# Patient Record
Sex: Female | Born: 1970 | Race: White | Hispanic: No | Marital: Single | Smoking: Former smoker
Health system: Southern US, Community
[De-identification: ages and names within clinical notes are randomized; demographics above are authoritative.]

## PROBLEM LIST (undated history)

## (undated) DIAGNOSIS — G111 Early-onset cerebellar ataxia: Principal | ICD-10-CM

## (undated) DIAGNOSIS — S82899A Other fracture of unspecified lower leg, initial encounter for closed fracture: Secondary | ICD-10-CM

## (undated) DIAGNOSIS — K219 Gastro-esophageal reflux disease without esophagitis: Secondary | ICD-10-CM

## (undated) DIAGNOSIS — N200 Calculus of kidney: Secondary | ICD-10-CM

## (undated) DIAGNOSIS — J189 Pneumonia, unspecified organism: Secondary | ICD-10-CM

## (undated) DIAGNOSIS — Z9889 Other specified postprocedural states: Secondary | ICD-10-CM

## (undated) DIAGNOSIS — R011 Cardiac murmur, unspecified: Secondary | ICD-10-CM

## (undated) DIAGNOSIS — M1990 Unspecified osteoarthritis, unspecified site: Secondary | ICD-10-CM

## (undated) DIAGNOSIS — G1111 Friedreich ataxia: Secondary | ICD-10-CM

## (undated) DIAGNOSIS — J9600 Acute respiratory failure, unspecified whether with hypoxia or hypercapnia: Secondary | ICD-10-CM

## (undated) DIAGNOSIS — J449 Chronic obstructive pulmonary disease, unspecified: Secondary | ICD-10-CM

## (undated) DIAGNOSIS — Z9981 Dependence on supplemental oxygen: Secondary | ICD-10-CM

## (undated) DIAGNOSIS — J45909 Unspecified asthma, uncomplicated: Secondary | ICD-10-CM

## (undated) HISTORY — DX: Early-onset cerebellar ataxia: G11.1

## (undated) HISTORY — DX: Unspecified asthma, uncomplicated: J45.909

## (undated) HISTORY — PX: KNEE ARTHROSCOPY: SHX127

## (undated) HISTORY — DX: Acute respiratory failure, unspecified whether with hypoxia or hypercapnia: J96.00

## (undated) HISTORY — DX: Chronic obstructive pulmonary disease, unspecified: J44.9

## (undated) HISTORY — DX: Gastro-esophageal reflux disease without esophagitis: K21.9

## (undated) HISTORY — PX: CHOLECYSTECTOMY: SHX55

## (undated) HISTORY — DX: Friedreich ataxia: G11.11

## (undated) HISTORY — DX: Unspecified osteoarthritis, unspecified site: M19.90

## (undated) HISTORY — PX: TOE SURGERY: SHX1073

---

## 1998-07-20 ENCOUNTER — Other Ambulatory Visit: Admission: RE | Admit: 1998-07-20 | Discharge: 1998-07-20 | Payer: Self-pay | Admitting: Internal Medicine

## 1999-03-13 ENCOUNTER — Encounter: Payer: Self-pay | Admitting: Emergency Medicine

## 1999-03-13 ENCOUNTER — Inpatient Hospital Stay (HOSPITAL_COMMUNITY): Admission: EM | Admit: 1999-03-13 | Discharge: 1999-03-15 | Payer: Self-pay | Admitting: Emergency Medicine

## 1999-03-14 ENCOUNTER — Encounter: Payer: Self-pay | Admitting: Family Medicine

## 1999-03-29 ENCOUNTER — Encounter: Payer: Self-pay | Admitting: Emergency Medicine

## 1999-03-29 ENCOUNTER — Emergency Department (HOSPITAL_COMMUNITY): Admission: EM | Admit: 1999-03-29 | Discharge: 1999-03-29 | Payer: Self-pay | Admitting: Emergency Medicine

## 1999-06-10 ENCOUNTER — Emergency Department (HOSPITAL_COMMUNITY): Admission: EM | Admit: 1999-06-10 | Discharge: 1999-06-10 | Payer: Self-pay | Admitting: Emergency Medicine

## 1999-06-10 ENCOUNTER — Encounter: Payer: Self-pay | Admitting: Emergency Medicine

## 1999-08-16 ENCOUNTER — Encounter: Payer: Self-pay | Admitting: Emergency Medicine

## 1999-08-16 ENCOUNTER — Emergency Department (HOSPITAL_COMMUNITY): Admission: EM | Admit: 1999-08-16 | Discharge: 1999-08-16 | Payer: Self-pay | Admitting: Emergency Medicine

## 2000-07-28 ENCOUNTER — Emergency Department (HOSPITAL_COMMUNITY): Admission: EM | Admit: 2000-07-28 | Discharge: 2000-07-28 | Payer: Self-pay | Admitting: Emergency Medicine

## 2000-07-28 ENCOUNTER — Encounter: Payer: Self-pay | Admitting: Emergency Medicine

## 2001-08-26 ENCOUNTER — Emergency Department (HOSPITAL_COMMUNITY): Admission: EM | Admit: 2001-08-26 | Discharge: 2001-08-26 | Payer: Self-pay

## 2001-08-26 ENCOUNTER — Encounter: Payer: Self-pay | Admitting: Emergency Medicine

## 2002-01-25 ENCOUNTER — Encounter: Payer: Self-pay | Admitting: Emergency Medicine

## 2002-01-25 ENCOUNTER — Emergency Department (HOSPITAL_COMMUNITY): Admission: EM | Admit: 2002-01-25 | Discharge: 2002-01-25 | Payer: Self-pay | Admitting: Emergency Medicine

## 2002-03-14 ENCOUNTER — Emergency Department (HOSPITAL_COMMUNITY): Admission: EM | Admit: 2002-03-14 | Discharge: 2002-03-14 | Payer: Self-pay | Admitting: Emergency Medicine

## 2002-10-05 ENCOUNTER — Emergency Department (HOSPITAL_COMMUNITY): Admission: EM | Admit: 2002-10-05 | Discharge: 2002-10-05 | Payer: Self-pay | Admitting: Emergency Medicine

## 2002-10-05 ENCOUNTER — Encounter: Payer: Self-pay | Admitting: Emergency Medicine

## 2002-12-01 ENCOUNTER — Emergency Department (HOSPITAL_COMMUNITY): Admission: EM | Admit: 2002-12-01 | Discharge: 2002-12-01 | Payer: Self-pay | Admitting: Emergency Medicine

## 2002-12-01 ENCOUNTER — Encounter: Payer: Self-pay | Admitting: Emergency Medicine

## 2002-12-09 ENCOUNTER — Emergency Department (HOSPITAL_COMMUNITY): Admission: EM | Admit: 2002-12-09 | Discharge: 2002-12-10 | Payer: Self-pay | Admitting: Emergency Medicine

## 2002-12-29 ENCOUNTER — Encounter: Payer: Self-pay | Admitting: Emergency Medicine

## 2002-12-29 ENCOUNTER — Emergency Department (HOSPITAL_COMMUNITY): Admission: EM | Admit: 2002-12-29 | Discharge: 2002-12-29 | Payer: Self-pay | Admitting: Emergency Medicine

## 2003-01-08 ENCOUNTER — Encounter: Payer: Self-pay | Admitting: Orthopedic Surgery

## 2003-04-03 ENCOUNTER — Emergency Department (HOSPITAL_COMMUNITY): Admission: EM | Admit: 2003-04-03 | Discharge: 2003-04-03 | Payer: Self-pay | Admitting: Emergency Medicine

## 2003-04-29 ENCOUNTER — Emergency Department (HOSPITAL_COMMUNITY): Admission: EM | Admit: 2003-04-29 | Discharge: 2003-04-29 | Payer: Self-pay | Admitting: Emergency Medicine

## 2003-06-07 ENCOUNTER — Inpatient Hospital Stay (HOSPITAL_COMMUNITY): Admission: EM | Admit: 2003-06-07 | Discharge: 2003-06-10 | Payer: Self-pay | Admitting: Emergency Medicine

## 2003-06-13 ENCOUNTER — Encounter: Admission: RE | Admit: 2003-06-13 | Discharge: 2003-06-13 | Payer: Self-pay | Admitting: Family Medicine

## 2003-07-01 ENCOUNTER — Other Ambulatory Visit: Admission: RE | Admit: 2003-07-01 | Discharge: 2003-07-01 | Payer: Self-pay | Admitting: Family Medicine

## 2003-07-01 ENCOUNTER — Encounter: Admission: RE | Admit: 2003-07-01 | Discharge: 2003-07-01 | Payer: Self-pay | Admitting: Family Medicine

## 2003-07-01 ENCOUNTER — Encounter (INDEPENDENT_AMBULATORY_CARE_PROVIDER_SITE_OTHER): Payer: Self-pay | Admitting: *Deleted

## 2003-07-01 LAB — CONVERTED CEMR LAB

## 2003-07-31 ENCOUNTER — Encounter: Admission: RE | Admit: 2003-07-31 | Discharge: 2003-07-31 | Payer: Self-pay | Admitting: Family Medicine

## 2003-08-19 ENCOUNTER — Encounter: Admission: RE | Admit: 2003-08-19 | Discharge: 2003-08-19 | Payer: Self-pay | Admitting: Family Medicine

## 2003-09-01 ENCOUNTER — Encounter: Admission: RE | Admit: 2003-09-01 | Discharge: 2003-09-01 | Payer: Self-pay | Admitting: Sports Medicine

## 2003-10-12 ENCOUNTER — Emergency Department (HOSPITAL_COMMUNITY): Admission: EM | Admit: 2003-10-12 | Discharge: 2003-10-12 | Payer: Self-pay | Admitting: *Deleted

## 2003-12-11 ENCOUNTER — Encounter: Payer: Self-pay | Admitting: Orthopedic Surgery

## 2004-01-27 ENCOUNTER — Encounter: Admission: RE | Admit: 2004-01-27 | Discharge: 2004-01-30 | Payer: Self-pay | Admitting: Preventative Medicine

## 2004-03-10 ENCOUNTER — Emergency Department (HOSPITAL_COMMUNITY): Admission: EM | Admit: 2004-03-10 | Discharge: 2004-03-10 | Payer: Self-pay | Admitting: Emergency Medicine

## 2004-04-08 ENCOUNTER — Ambulatory Visit (HOSPITAL_COMMUNITY): Admission: RE | Admit: 2004-04-08 | Discharge: 2004-04-08 | Payer: Self-pay | Admitting: Orthopedic Surgery

## 2004-04-15 ENCOUNTER — Ambulatory Visit (HOSPITAL_COMMUNITY): Admission: RE | Admit: 2004-04-15 | Discharge: 2004-04-15 | Payer: Self-pay | Admitting: Orthopedic Surgery

## 2004-04-29 ENCOUNTER — Ambulatory Visit: Admission: RE | Admit: 2004-04-29 | Discharge: 2004-04-29 | Payer: Self-pay | Admitting: Orthopedic Surgery

## 2004-06-11 ENCOUNTER — Emergency Department (HOSPITAL_COMMUNITY): Admission: EM | Admit: 2004-06-11 | Discharge: 2004-06-11 | Payer: Self-pay | Admitting: Emergency Medicine

## 2005-03-27 ENCOUNTER — Emergency Department (HOSPITAL_COMMUNITY): Admission: EM | Admit: 2005-03-27 | Discharge: 2005-03-27 | Payer: Self-pay | Admitting: Emergency Medicine

## 2005-03-28 ENCOUNTER — Emergency Department (HOSPITAL_COMMUNITY): Admission: EM | Admit: 2005-03-28 | Discharge: 2005-03-28 | Payer: Self-pay | Admitting: Emergency Medicine

## 2005-05-05 ENCOUNTER — Emergency Department (HOSPITAL_COMMUNITY): Admission: EM | Admit: 2005-05-05 | Discharge: 2005-05-05 | Payer: Self-pay | Admitting: Emergency Medicine

## 2005-07-03 ENCOUNTER — Emergency Department (HOSPITAL_COMMUNITY): Admission: EM | Admit: 2005-07-03 | Discharge: 2005-07-03 | Payer: Self-pay | Admitting: Family Medicine

## 2006-01-05 ENCOUNTER — Emergency Department (HOSPITAL_COMMUNITY): Admission: EM | Admit: 2006-01-05 | Discharge: 2006-01-05 | Payer: Self-pay | Admitting: Emergency Medicine

## 2006-02-24 ENCOUNTER — Emergency Department (HOSPITAL_COMMUNITY): Admission: EM | Admit: 2006-02-24 | Discharge: 2006-02-24 | Payer: Self-pay | Admitting: Emergency Medicine

## 2006-05-25 ENCOUNTER — Encounter: Admission: RE | Admit: 2006-05-25 | Discharge: 2006-08-11 | Payer: Self-pay | Admitting: Orthopedic Surgery

## 2006-06-29 DIAGNOSIS — K279 Peptic ulcer, site unspecified, unspecified as acute or chronic, without hemorrhage or perforation: Secondary | ICD-10-CM | POA: Insufficient documentation

## 2006-06-29 DIAGNOSIS — J449 Chronic obstructive pulmonary disease, unspecified: Secondary | ICD-10-CM

## 2006-06-29 DIAGNOSIS — F17200 Nicotine dependence, unspecified, uncomplicated: Secondary | ICD-10-CM

## 2006-06-29 DIAGNOSIS — R8789 Other abnormal findings in specimens from female genital organs: Secondary | ICD-10-CM

## 2006-06-30 ENCOUNTER — Encounter (INDEPENDENT_AMBULATORY_CARE_PROVIDER_SITE_OTHER): Payer: Self-pay | Admitting: *Deleted

## 2006-10-24 ENCOUNTER — Emergency Department (HOSPITAL_COMMUNITY): Admission: EM | Admit: 2006-10-24 | Discharge: 2006-10-24 | Payer: Self-pay | Admitting: Emergency Medicine

## 2006-10-26 ENCOUNTER — Emergency Department (HOSPITAL_COMMUNITY): Admission: EM | Admit: 2006-10-26 | Discharge: 2006-10-26 | Payer: Self-pay | Admitting: Emergency Medicine

## 2007-01-13 ENCOUNTER — Emergency Department (HOSPITAL_COMMUNITY): Admission: EM | Admit: 2007-01-13 | Discharge: 2007-01-14 | Payer: Self-pay | Admitting: Emergency Medicine

## 2007-02-18 ENCOUNTER — Inpatient Hospital Stay (HOSPITAL_COMMUNITY): Admission: EM | Admit: 2007-02-18 | Discharge: 2007-02-19 | Payer: Self-pay | Admitting: Emergency Medicine

## 2007-06-12 ENCOUNTER — Inpatient Hospital Stay (HOSPITAL_COMMUNITY): Admission: EM | Admit: 2007-06-12 | Discharge: 2007-06-14 | Payer: Self-pay | Admitting: Emergency Medicine

## 2007-08-31 ENCOUNTER — Encounter: Payer: Self-pay | Admitting: Orthopedic Surgery

## 2007-08-31 ENCOUNTER — Emergency Department (HOSPITAL_COMMUNITY): Admission: EM | Admit: 2007-08-31 | Discharge: 2007-08-31 | Payer: Self-pay | Admitting: Emergency Medicine

## 2007-09-26 ENCOUNTER — Ambulatory Visit: Payer: Self-pay | Admitting: Orthopedic Surgery

## 2007-09-26 DIAGNOSIS — S62319A Displaced fracture of base of unspecified metacarpal bone, initial encounter for closed fracture: Secondary | ICD-10-CM

## 2007-09-27 ENCOUNTER — Encounter: Payer: Self-pay | Admitting: Orthopedic Surgery

## 2007-10-30 ENCOUNTER — Ambulatory Visit: Payer: Self-pay | Admitting: Orthopedic Surgery

## 2007-10-30 ENCOUNTER — Ambulatory Visit (HOSPITAL_COMMUNITY): Admission: RE | Admit: 2007-10-30 | Discharge: 2007-10-30 | Payer: Self-pay | Admitting: Orthopedic Surgery

## 2007-11-30 ENCOUNTER — Emergency Department (HOSPITAL_COMMUNITY): Admission: EM | Admit: 2007-11-30 | Discharge: 2007-12-01 | Payer: Self-pay | Admitting: Emergency Medicine

## 2008-01-09 ENCOUNTER — Emergency Department (HOSPITAL_COMMUNITY): Admission: EM | Admit: 2008-01-09 | Discharge: 2008-01-09 | Payer: Self-pay | Admitting: Emergency Medicine

## 2008-03-08 ENCOUNTER — Emergency Department (HOSPITAL_COMMUNITY): Admission: EM | Admit: 2008-03-08 | Discharge: 2008-03-08 | Payer: Self-pay | Admitting: Emergency Medicine

## 2008-10-31 ENCOUNTER — Emergency Department (HOSPITAL_COMMUNITY): Admission: EM | Admit: 2008-10-31 | Discharge: 2008-10-31 | Payer: Self-pay | Admitting: Emergency Medicine

## 2009-01-16 ENCOUNTER — Emergency Department (HOSPITAL_COMMUNITY): Admission: EM | Admit: 2009-01-16 | Discharge: 2009-01-16 | Payer: Self-pay | Admitting: Emergency Medicine

## 2009-03-05 ENCOUNTER — Inpatient Hospital Stay (HOSPITAL_COMMUNITY): Admission: EM | Admit: 2009-03-05 | Discharge: 2009-03-07 | Payer: Self-pay | Admitting: Emergency Medicine

## 2009-07-18 ENCOUNTER — Ambulatory Visit: Payer: Self-pay | Admitting: Pulmonary Disease

## 2009-07-19 ENCOUNTER — Inpatient Hospital Stay (HOSPITAL_COMMUNITY): Admission: EM | Admit: 2009-07-19 | Discharge: 2009-07-28 | Payer: Self-pay | Admitting: Emergency Medicine

## 2009-08-04 ENCOUNTER — Ambulatory Visit: Payer: Self-pay | Admitting: Pulmonary Disease

## 2009-08-19 ENCOUNTER — Ambulatory Visit: Payer: Self-pay | Admitting: Pulmonary Disease

## 2009-08-19 DIAGNOSIS — Z9981 Dependence on supplemental oxygen: Secondary | ICD-10-CM

## 2009-10-19 ENCOUNTER — Ambulatory Visit: Payer: Self-pay | Admitting: Pulmonary Disease

## 2009-10-30 ENCOUNTER — Ambulatory Visit (HOSPITAL_COMMUNITY): Admission: RE | Admit: 2009-10-30 | Discharge: 2009-10-30 | Payer: Self-pay | Admitting: Sports Medicine

## 2009-11-03 ENCOUNTER — Emergency Department (HOSPITAL_COMMUNITY): Admission: EM | Admit: 2009-11-03 | Discharge: 2009-11-03 | Payer: Self-pay | Admitting: Emergency Medicine

## 2009-11-10 ENCOUNTER — Encounter: Admission: RE | Admit: 2009-11-10 | Discharge: 2010-01-29 | Payer: Self-pay | Admitting: Sports Medicine

## 2009-12-03 ENCOUNTER — Ambulatory Visit: Payer: Self-pay | Admitting: Pulmonary Disease

## 2010-01-27 ENCOUNTER — Encounter: Payer: Self-pay | Admitting: Pulmonary Disease

## 2010-01-30 ENCOUNTER — Emergency Department (HOSPITAL_COMMUNITY): Admission: EM | Admit: 2010-01-30 | Discharge: 2010-01-30 | Payer: Self-pay | Admitting: Emergency Medicine

## 2010-02-01 ENCOUNTER — Ambulatory Visit: Payer: Self-pay | Admitting: Pulmonary Disease

## 2010-02-18 ENCOUNTER — Encounter: Admission: RE | Admit: 2010-02-18 | Discharge: 2010-02-18 | Payer: Self-pay | Admitting: Sports Medicine

## 2010-03-02 ENCOUNTER — Encounter: Admission: RE | Admit: 2010-03-02 | Discharge: 2010-03-03 | Payer: Self-pay | Admitting: Sports Medicine

## 2010-03-05 ENCOUNTER — Emergency Department (HOSPITAL_COMMUNITY): Admission: EM | Admit: 2010-03-05 | Discharge: 2010-03-06 | Payer: Self-pay | Admitting: Emergency Medicine

## 2010-03-18 ENCOUNTER — Encounter
Admission: RE | Admit: 2010-03-18 | Discharge: 2010-04-06 | Payer: Self-pay | Source: Home / Self Care | Admitting: Sports Medicine

## 2010-04-05 ENCOUNTER — Ambulatory Visit: Payer: Self-pay | Admitting: Pulmonary Disease

## 2010-04-05 DIAGNOSIS — G111 Early-onset cerebellar ataxia: Secondary | ICD-10-CM

## 2010-04-05 DIAGNOSIS — G1111 Friedreich ataxia: Secondary | ICD-10-CM | POA: Insufficient documentation

## 2010-05-22 ENCOUNTER — Encounter: Payer: Self-pay | Admitting: Orthopedic Surgery

## 2010-06-03 NOTE — Assessment & Plan Note (Signed)
Summary: f/u ///kp   Visit Type:  Follow-up Primary Evi Mccomb/Referring Leaira Fullam:  Claudie Revering  CC:  2 month follow up.  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  Was tx w/ IV abx, steroids, she was tx w/ Tamiflu as well.  H. Influ-beta lact neg. other cx were neg. Alpha 1-antitrypsin were >301. CXR showed NAD. She did have qutie a bit of agitation/anxiety during admission.  She was discharged on Avelox and prednisone taper. Discharged on O2 . Has home health assistance at present.  August 19, 2009 4:29 PM  Accompanied by partner - reviewed pFTs FEV1 0.84 (34%) She is feeling better but still very weak w/ some cough/congesiton. She has smoked since age 78. Father had severe COPD. She has smoked since discharge - now 2-3 cigs/d. Is on nicotine patches .  December 03, 2009 11:21 AM  In with mother & friend - cat scratches over her face, c/o green phlegm, cough , dyspnea, received cefdinir in june. Continues to smoke 4 cigs/ d - they smoke outside.   April 05, 2010 1:57 PM  Off advair , now on symbicort (red inhaler - confirmed) -, Breathing better,   she reports  quit in aug'11 but office staff saw her smoking Worse in am, O2 helps but does not use every night   Current Medications (verified): 1)  Ventolin Hfa 108 (90 Base) Mcg/act Aers (Albuterol Sulfate) .... Inhale 2 Puffs Every Four Hours As Needed 2)  Oxygen 2l/min .... At Bedtime 3)  Hydrocodone-Acetaminophen 5-500 Mg Tabs (Hydrocodone-Acetaminophen) .... As Needed For Pain 4)  Albuterol Sulfate (2.5 Mg/59ml) 0.083% Nebu (Albuterol Sulfate) .... As Needed  Allergies: No Known Drug Allergies  Past History:  Past Medical History: Last updated: 08/19/2009 1. Arthritis 2. diabetic 3. Acute hypercarbic respiratory failure w/ COPD 2/2 PNA-vent support.  . (07/19/09) 4.  COPD --Alpha 1-antitrypsin were >301.         --PFTs>>FEV1 34% (0.84)  Social History: Last updated: 02/01/2010 lives with partner,Has 2 children (ages 28 and 15) who lives with her brother.  Smoked 1/2 ppd started at age 55yrs.   No IV drug use. social alcohol use-- no caffeine  Past Pulmonary History:  Pulmonary History: Acute hypercarbic respiratory failure w/ COPD 2/2 PNA-vent support.  . (07/19/09)  COPD --Alpha 1-antitrypsin were >301.         --PFTs>>FEV1 34% (0.84)  Review of Systems       The patient complains of dyspnea on exertion.  The patient denies anorexia, fever, weight loss, weight gain, vision loss, decreased hearing, hoarseness, chest pain, syncope, peripheral edema, prolonged cough, headaches, hemoptysis, abdominal pain, melena, hematochezia, severe indigestion/heartburn, hematuria, muscle weakness, suspicious skin lesions, difficulty walking, depression, unusual weight change, abnormal bleeding, enlarged lymph nodes, and angioedema.    Vital Signs:  Patient profile:   40 year old female Height:      60 inches Weight:      83.8 pounds BMI:     16.43 O2 Sat:      94 % on Room air Temp:     98.4 degrees F oral Pulse rate:   73 / minute BP sitting:   122 / 90  (left arm) Cuff size:   small  Vitals Entered By: Zackery Barefoot CMA (April 05, 2010 1:46 PM)  O2 Flow:  Room air CC:  2 month follow up Comments Medications reviewed with patient Verified contact number and pharmacy with patient Zackery Barefoot [facility]  April 05, 2010 1:46 PM    Physical Exam  Additional Exam:  78 lbs December 03, 2009  GEN: A/Ox3;  , NAD, very thin dishelved.  HEENT:  [place]/AT, , EACs-clear, TMs-wnl, NOSE-clear, THROAT-clear, poor dental care NECK:  Supple w/ fair ROM; no JVD; normal carotid impulses w/o bruits; no thyromegaly or nodules palpated; no lymphadenopathy. 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 Patient Level III (04540)  Problem # 3:  TOBACCO DEPENDENCE (ICD-305.1)  I am not sure whether she willl not quit because she feels she will not live long anyway due to incurable neurological disease.  Orders: Est. Patient Level III (98119)  Medications Added to Medication List This Visit: 1)  Hydrocodone-acetaminophen 5-500 Mg Tabs (Hydrocodone-acetaminophen) .... As needed for pain 2)  Albuterol Sulfate (2.5 Mg/34ml) 0.083% Nebu (Albuterol sulfate) .... As needed  Patient Instructions: 1)  Copy sent to: Dr Claudie Revering 2)  Please schedule a follow-up appointment in 3- 4 months with TP 3)  check O2 satn & name of red inhaler

## 2010-06-03 NOTE — Miscellaneous (Signed)
Summary: Orders Update pft charges  Clinical Lists Changes  Orders: Added new Service order of Carbon Monoxide diffusing w/capacity (94720) - Signed Added new Service order of Lung Volumes (94240) - Signed Added new Service order of Spirometry (Pre & Post) (94060) - Signed 

## 2010-06-03 NOTE — Assessment & Plan Note (Signed)
Summary: pft and rov/klw   CC:  Pt is here for a f/u appt to discuss PFT results.  .  History of Present Illness: 40 yo female seen for initial pulmonary consult during hospitalization 07/19/09 for Acute Hypercarbic Resp Failure 2/2 PNA requiring vent support.  She was admitted   07/19/2009-  07/28/2009 for Acute hypercarbic respiratory failure and underlying chronic  obstructive pulmonary disease.and  Pneumonia. She was admitted w/ COPD exacerbation noted to have hypercarbic RF on admission required intubation.  Was tx w/ IV abx, steroids, she was tx w/ Tamiflu as well. Cx showed H. Influ-beta lact neg. other cx were neg. Alpha 1-antitrypsin were >301. CXR showed NAD. She did have qutie a bit of agitation/anxiety during admission. She did self extubated on 07/24/09. She was discharged on Avelox and prednisone taper. Discharged on O2 . Has home health assistance at present.  August 19, 2009 4:29 PM  Accompanied by partner - reviewed pFTs FEV1 0.84 (34%)  Denies chest pain,  orthopnea, hemoptysis, fever, n/v/d, edema, headache. She is feeling better but still very weak w/ some cough/congesiton .Denies chest pain,  orthopnea, hemoptysis, fever, n/v/d, edema, headache. She has smoked since age 77. Father had severe COPD. She has smoked since discharge - now 2-3 cigs/d. Is on nicotine patches presently.     Current Medications (verified): 1)  Ventolin Hfa 108 (90 Base) Mcg/act Aers (Albuterol Sulfate) .... Inhale 2 Puffs Every Four Hours As Needed 2)  Multivitamins   Tabs (Multiple Vitamin) .... Take 1 Tablet By Mouth Once A Day 3)  Advair Diskus 250-50 Mcg/dose Aepb (Fluticasone-Salmeterol) .... Inhale 1 Puff Two Times A Day  Allergies (verified): No Known Drug Allergies  Past History:  Social History: Last updated: 08/04/2009 lives with partner,Has 2 children (ages 50 and 85) who lives with her brother.  Smoked 1/2 ppd started at age 44yrs.   No IV drug use. social alcohol use no  caffeine  Past Medical History: 1. Arthritis 2. diabetic 3. Acute hypercarbic respiratory failure w/ COPD 2/2 PNA-vent support.  . (07/19/09) 4. COPD --Alpha 1-antitrypsin were >301.         --PFTs>>FEV1 34% (0.84)  Past Pulmonary History:  Pulmonary History: Acute hypercarbic respiratory failure w/ COPD 2/2 PNA-vent support.  . (07/19/09)  COPD --Alpha 1-antitrypsin were >301.         --PFTs>>  Review of Systems       The patient complains of dyspnea on exertion.  The patient denies anorexia, fever, weight loss, weight gain, vision loss, decreased hearing, hoarseness, chest pain, syncope, peripheral edema, prolonged cough, headaches, hemoptysis, abdominal pain, melena, hematochezia, severe indigestion/heartburn, hematuria, muscle weakness, transient blindness, difficulty walking, depression, unusual weight change, and abnormal bleeding.    Vital Signs:  Patient profile:   40 year old female Height:      60 inches Weight:      72 pounds BMI:     14.11 O2 Sat:      91 % on Room air Temp:     98.2 degrees F oral Pulse rate:   107 / minute BP sitting:   114 / 82  (left arm) Cuff size:   small  Vitals Entered By: Arman Filter LPN (August 19, 2009 4:11 PM)  O2 Flow:  Room air CC: Pt is here for a f/u appt to discuss PFT results.   Comments Medications reviewed with patient Arman Filter LPN  August 19, 2009 4:12 PM    Physical Exam  Additional Exam:  GEN:  A/Ox3; pleasant , NAD, very thin HEENT:  [place]/AT, , EACs-clear, TMs-wnl, NOSE-clear, THROAT-clear, poor dental care NECK:  Supple w/ fair ROM; no JVD; normal carotid impulses w/o bruits; no thyromegaly or nodules palpated; no lymphadenopathy. RESP  Decreased BS in bases  CARD:  RRR, no m/r/g   Musco: Warm bil,  no calf tenderness edema, clubbing, pulses intact, no edema    Impression & Recommendations:  Problem # 1:  COPD (ICD-496) Off prednisone ct advair - consider adding spiriva in future if she c/o exercise  intolerance  Problem # 2:  TOBACCO DEPENDENCE (ICD-305.1)  Best to focus our efforts here. Explained course of COPD, may be a candidate for chantix if she is able to commit to quit. NP visit in 1 month to FU on this  Orders: Est. 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 Patient Level III (87564)  Other Orders: Pneumococcal Vaccine (33295) Admin 1st Vaccine (18841)  Patient Instructions: 1)  Copy sent to: Dr Concepcion Elk 2)  Please schedule a follow-up appointment in 2 months with TP 3)  Pneumovax   Orders Added: 1)  Pneumococcal Vaccine [90732] 2)  Admin 1st Vaccine [90471] 3)  Est. Patient Level III [66063]    Immunizations Administered:  Pneumonia Vaccine:    Vaccine Type: Pneumovax    Site: right buttock    Mfr: Merck    Dose: 0.5 ml    Route: IM    Given by: Carver Fila    Exp. Date: 08/20/2010    Lot #: [id number]    VIS given: 11/28/95 version given August 19, 2009.

## 2010-06-03 NOTE — Assessment & Plan Note (Signed)
Summary: 2 months/ mbw   Visit Type:  Follow-up Primary Provider/Referring Provider:  Cammie Sickle  CC:  Pt here for follow up. Pt c/o waking up in the mornings "gaging" and can not breathe.  History of Present Illness: 40 yo female seen for initial pulmonary consult during hospitalization 07/19/09 for Acute Hypercarbic Resp Failure 2/2 PNA requiring vent support.  She was admitted   07/19/2009-  07/28/2009 w/ COPD exacerbation noted to have hypercarbic RF on admission required intubation.  Was tx w/ IV abx, steroids, she was tx w/ Tamiflu as well.  H. Influ-beta lact neg. other cx were neg. Alpha 1-antitrypsin were >301. CXR showed NAD. She did have qutie a bit of agitation/anxiety during admission.  She was discharged on Avelox and prednisone taper. Discharged on O2 . Has home health assistance at present.  August 19, 2009 4:29 PM  Accompanied by partner - reviewed pFTs FEV1 0.84 (34%) She is feeling better but still very weak w/ some cough/congesiton. She has smoked since age 86. Father had severe COPD. She has smoked since discharge - now 2-3 cigs/d. Is on nicotine patches .  December 03, 2009 11:21 AM  In with mother & friend - cat scratches over her face, c/o green phlegm, cough , dyspnea, received cefdinir in june. Continues to smoke 4 cigs/ d - they smoke outside. Denies chest pain,  orthopnea, hemoptysis, fever, n/v/d, edema, headache.     Preventive Screening-Counseling & Management  Alcohol-Tobacco     Smoking Status: current     Packs/Day: <0.25     Year Started: 1979  Caffeine-Diet-Exercise     Caffeine use/day: 0  Current Medications (verified): 1)  Ventolin Hfa 108 (90 Base) Mcg/act Aers (Albuterol Sulfate) .... Inhale 2 Puffs Every Four Hours As Needed 2)  Advair Diskus 250-50 Mcg/dose Aepb (Fluticasone-Salmeterol) .... Inhale 1 Puff Two Times A Day 3)  Oxygen 2l/min .... At Bedtime 4)  Albuterol Sulfate (2.5 Mg/5ml) 0.083% Nebu (Albuterol Sulfate) .... Inhale 1 Vial Via  Hhn Every 6 Hours 5)  Hydrocodone .... As Needed  Allergies (verified): No Known Drug Allergies  Past History:  Past Medical History: Last updated: 08/19/2009 1. Arthritis 2. diabetic 3. Acute hypercarbic respiratory failure w/ COPD 2/2 PNA-vent support.  . (07/19/09) 4. COPD --Alpha 1-antitrypsin were >301.         --PFTs>>FEV1 34% (0.84)  Social History: Last updated: 08/04/2009 lives with partner,Has 2 children (ages 55 and 97) who lives with her brother.  Smoked 1/2 ppd started at age 83yrs.   No IV drug use. social alcohol use no caffeine  Past Pulmonary History:  Pulmonary History: Acute hypercarbic respiratory failure w/ COPD 2/2 PNA-vent support.  . (07/19/09)  COPD --Alpha 1-antitrypsin were >301.         --PFTs>>FEV1 34% (0.84)  Social History: Smoking Status:  current Packs/Day:  <0.25  Review of Systems       The patient complains of dyspnea on exertion and prolonged cough.  The patient denies anorexia, fever, weight loss, weight gain, vision loss, decreased hearing, hoarseness, chest pain, syncope, peripheral edema, headaches, hemoptysis, abdominal pain, melena, hematochezia, severe indigestion/heartburn, hematuria, muscle weakness, difficulty walking, depression, unusual weight change, and abnormal bleeding.    Vital Signs:  Patient profile:   40 year old female Height:      60 inches Weight:      78 pounds BMI:     15.29 O2 Sat:      91 % on Room air Temp:  98.7 degrees F oral Pulse rate:   103 / minute BP sitting:   130 / 98  (left arm) Cuff size:   small  Vitals Entered By: Zackery Barefoot CMA (December 03, 2009 11:17 AM)  O2 Flow:  Room air CC: Pt here for follow up. Pt c/o waking up in the mornings "gaging" and can not breathe Comments Medications reviewed with patient Verified contact number and pharmacy with patient Zackery Barefoot CMA  December 03, 2009 11:17 AM    Physical Exam  Additional Exam:  78 lbs December 03, 2009  GEN: A/Ox3;  pleasant , NAD, very thin HEENT:  [place]/AT, , EACs-clear, TMs-wnl, NOSE-clear, THROAT-clear, poor dental care NECK:  Supple w/ fair ROM; no JVD; normal carotid impulses w/o bruits; no thyromegaly or nodules palpated; no lymphadenopathy. RESP  Coarse BS w/ few rhonchi CARD:  RRR, no m/r/g   Musco: Warm bil,  no calf tenderness edema, clubbing, pulses intact, no edema    Impression & Recommendations:  Problem # 1:  COPD (ICD-496) Will treat with azithro for acute bronchitis ct advair/ albuterol nebs  Problem # 2:  TOBACCO DEPENDENCE (ICD-305.1)  Clearly, paramount here I made it clear to her that we would stop seeing her unless she quit smoking. Also discussed dangers of smoking with oxygen around. Threatened her that we may have to take oxygen away if she does not quit in the next couple of months. This was expressed to her mohter & partner also.  Orders: Est. Patient Level IV (04540) Prescription Created Electronically [phone])  Medications Added to Medication List This Visit: 1)  Albuterol Sulfate (2.5 Mg/53ml) 0.083% Nebu (Albuterol sulfate) .... Inhale 1 vial via hhn every 6 hours 2)  Hydrocodone  .... As needed 3)  Azithromycin 500 Mg Tabs (Azithromycin) .... Once daily  Patient Instructions: 1)  Copy sent to:Dr Avubere 2)  Please schedule a follow-up appointment in 2 -3  months with TP 3)  Antibioic & albuterol Rx sent to pharmacy 4)  If you do not stop smoking , we will have to take oxygen away Prescriptions: AZITHROMYCIN 500 MG TABS (AZITHROMYCIN) once daily  #5 x 0   Entered and Authorized by:   Comer Locket Vassie Loll MD   Signed by:   Comer Locket Vassie Loll MD on 12/03/2009   Method used:   Electronically to        [id number] (retail)       [address]       [place], [place]  [address]       Ph: [phone]       Fax: [phone]   RxID:   [id number] ALBUTEROL SULFATE (2.5 MG/3ML) 0.083% NEBU (ALBUTEROL SULFATE) inhale 1 vial via HHN  every 6 hours  #90 x 3   Entered and Authorized by:   Comer Locket Vassie Loll MD   Signed by:   Comer Locket Vassie Loll MD on 12/03/2009   Method used:   [facility] to        [facility] [address] [id number] ([facility]       [address]       [place], [place]  [address]       Ph: [phone]       Fax: [phone]   RxID:   [id number]

## 2010-06-03 NOTE — Assessment & Plan Note (Signed)
Summary: NP follow up - COPD   Primary Provider/Referring Provider:  Cammie Sickle  CC:  follow up and states has been wearing o2 with acitivity.  History of Present Illness: 40 yo female seen for initial pulmonary consult during hospitalization 07/19/09 for Acute Hypercarbic Resp Failure 2/2 PNA requiring vent support.  She was admitted   07/19/2009-  07/28/2009 w/ COPD exacerbation noted to have hypercarbic RF on admission required intubation.  Was tx w/ IV abx, steroids, she was tx w/ Tamiflu as well.  H. Influ-beta lact neg. other cx were neg. Alpha 1-antitrypsin were >301. CXR showed NAD. She did have qutie a bit of agitation/anxiety during admission.  She was discharged on Avelox and prednisone taper. Discharged on O2 . Has home health assistance at present.  August 19, 2009 4:29 PM  Accompanied by partner - reviewed pFTs FEV1 0.84 (34%) She is feeling better but still very weak w/ some cough/congesiton. She has smoked since age 49. Father had severe COPD. She has smoked since discharge - now 2-3 cigs/d. Is on nicotine patches .  December 03, 2009 11:21 AM  In with mother & friend - cat scratches over her face, c/o green phlegm, cough , dyspnea, received cefdinir in june. Continues to smoke 4 cigs/ d - they smoke outside.   February 01, 2010 --Presents for follow up. Says she has not smoked since last ov.  She wears her o2 with acitivity and at bedtime. Her family is with her today.  Says her breathing is the same she has intermittent cough and congestion mostly clear. Some discolored mucus in am  but clears as day goes on.  Advised again about smoking cesstation and dangers of O2 and smoking. Family and pt verbalized understaniding. Entire family and pt smell strongly of smoke.    Medications Prior to Update: 1)  Ventolin Hfa 108 (90 Base) Mcg/act Aers (Albuterol Sulfate) .... Inhale 2 Puffs Every Four Hours As Needed 2)  Advair Diskus 250-50 Mcg/dose Aepb (Fluticasone-Salmeterol) .... Inhale 1  Puff Two Times A Day 3)  Oxygen 2l/min .... At Bedtime 4)  Hydrocodone .... As Needed  Current Medications (verified): 1)  Ventolin Hfa 108 (90 Base) Mcg/act Aers (Albuterol Sulfate) .... Inhale 2 Puffs Every Four Hours As Needed 2)  Advair Diskus 250-50 Mcg/dose Aepb (Fluticasone-Salmeterol) .... Inhale 1 Puff Two Times A Day 3)  Oxygen 2l/min .... At Bedtime 4)  Tylenol With Codeine #3 300-30 Mg Tabs (Acetaminophen-Codeine) .Marland Kitchen.. 1-2 Every 4-6 Hours As Needed  Allergies (verified): No Known Drug Allergies  Past History:  Past Medical History: Last updated: 08/19/2009 1. Arthritis 2. diabetic 3. Acute hypercarbic respiratory failure w/ COPD 2/2 PNA-vent support.  . (07/19/09) 4. COPD --Alpha 1-antitrypsin were >301.         --PFTs>>FEV1 34% (0.84)  Family History: Last updated: 09/26/2007 Diabetes 1st degree Family History of Diabetes Hx, family, asthma  Social History: Last updated: 02/01/2010 lives with partner,Has 2 children (ages 53 and 76) who lives with her brother.  Smoked 1/2 ppd started at age 45yrs.   No IV drug use. social alcohol use-- no caffeine  Risk Factors: Caffeine Use: 0 (12/03/2009)  Risk Factors: Smoking Status: current (12/03/2009) Packs/Day: <0.25 (12/03/2009)  Past Pulmonary History:  Pulmonary History: Acute hypercarbic respiratory failure w/ COPD 2/2 PNA-vent support.  . (07/19/09)  COPD --Alpha 1-antitrypsin were >301.         --PFTs>>FEV1 34% (0.84)  Social History: lives with partner,Has 2 children (ages 38 and 70)  who lives with her brother.  Smoked 1/2 ppd started at age 77yrs.   No IV drug use. social alcohol use-- no caffeine  Review of Systems      See HPI  Vital Signs:  Patient profile:   40 year old female Height:      60 inches O2 Sat:      91 % on 2 L/min cont Temp:     98.3 degrees F oral Pulse rate:   112 / minute BP sitting:   150 / 90  (left arm) Cuff size:   small  Vitals Entered By: Boone Master CNA/MA  (February 01, 2010 11:09 AM)  O2 Flow:  2 L/min cont CC: follow up and states has been wearing o2 with acitivity Is Patient Diabetic? No Comments Medications reviewed with patient Daytime contact number verified with patient. Boone Master CNA/MA  February 01, 2010 11:10 AM    Physical Exam  Additional Exam:  78 lbs December 03, 2009  GEN: A/Ox3;  , NAD, very thin dishelved.  HEENT:  [place]/AT, , EACs-clear, TMs-wnl, NOSE-clear, THROAT-clear, poor dental care NECK:  Supple w/ fair ROM; no JVD; normal carotid impulses w/o bruits; no thyromegaly or nodules palpated; no lymphadenopathy. RESP  Coarse BS w/ few rhonchi, no wheezing  CARD:  RRR, no m/r/g   Musco: Warm bil,  no calf tenderness edema, clubbing, pulses intact, no edema, leg brace on left  Skin intact     Impression & Recommendations:  Problem # 1:  COPD (ICD-496) Severe O2 dependent -compensated on present regimen.  advised on smoking cesstation -encouragement provided.  follow up Dr. Vassie Loll in 2 months   Medications Added to Medication List This Visit: 1)  Tylenol With Codeine #3 300-30 Mg Tabs (Acetaminophen-codeine) .Marland Kitchen.. 1-2 every 4-6 hours as needed  Complete Medication List: 1)  Ventolin Hfa 108 (90 Base) Mcg/act Aers (Albuterol sulfate) .... Inhale 2 puffs every four hours as needed 2)  Advair Diskus 250-50 Mcg/dose Aepb (Fluticasone-salmeterol) .... Inhale 1 puff two times a day 3)  Oxygen 2l/min  .... At bedtime 4)  Tylenol With Codeine #3 300-30 Mg Tabs (Acetaminophen-codeine) .Marland Kitchen.. 1-2 every 4-6 hours as needed  Other Orders: Est. Patient Level II (16109)  Patient Instructions: 1)  Flu shot today.  2)  Continue on Advair two times a day , brush/rinse and gargle after use.  3)  Please contact office for sooner follow up as needed  4)  follow up Dr. Vassie Loll in 2-3 months     Appended Document: flu vax documentation    Clinical Lists Changes  Orders: Added new Service order of Admin 1st Vaccine (60454) -  Signed Added new Service order of Flu Vaccine 72yrs + [phone]) - Signed Observations: Added new observation of FLU VAX VIS: 11/24/09 version (02/01/2010 14:18) Added new observation of FLU VAXLOT: [id number] (02/01/2010 14:18) Added new observation of FLU VAXMFR: [employer] (02/01/2010 14:18) Added new observation of FLU VAX EXP: 10/30/2010 (02/01/2010 14:18) Added new observation of FLU VAX DSE: 0.64ml (02/01/2010 14:18) Added new observation of FLU VAX: Fluvax 3+ (02/01/2010 14:18)Flu Vaccine Consent Questions     Do you have a history of severe allergic reactions to this vaccine? no    Any prior history of allergic reactions to egg and/or gelatin? no    Do you have a sensitivity to the preservative Thimersol? no    Do you have a past history of Guillan-Barre Syndrome? no    Do you currently have an acute  febrile illness? no    Have you ever had a severe reaction to latex? no    Vaccine information given and explained to patient? yes    Are you currently pregnant? no    Lot Number:[id number]   Exp Date:10/30/2010   Site Given  Left Deltoid IMw observation of FLU VAX DSE: 0.22ml (02/01/2010 14:18) Added new observation of FLU VAX: Fluvax 3+ (02/01/2010 14:18) Boone Master CNA/MA  February 01, 2010 2:19 PM     .lbflu

## 2010-06-03 NOTE — Assessment & Plan Note (Signed)
Summary: NP follow up - post hosp   CC:  post hosp follow up .  History of Present Illness: 40 yo female seen for initial pulmonary consult during hospitalization 07/19/09 for Acute Hypercarbic Resp Failure 2/2 PNA requiring vent support-CCM admission.   August 04, 2009-Presents for a post hosp follow up - states breathing is doing well but still having dyspnea. She was admitted   07/19/2009-  07/28/2009 for Acute hypercarbic respiratory failure and underlying chronic  obstructive pulmonary disease.and  Pneumonia. She was admitted w/ COPD exacerbation noted to have hypercarbic RF on admission required intubation.  Was tx w/ IV abx, steroids, she was tx w/ Tamiflu as well. Cx showed H. Influ-beta lact neg. other cx were neg. Alpha 1-antitrypsin were >301. CXR showed NAD. She did have qutie a bit of agitation/anxiety during admission. She did self extubated on 07/24/09. She was discharged on Avelox and prednisone taper. Discharged on O2 . Has home health assistance at present. Denies chest pain,  orthopnea, hemoptysis, fever, n/v/d, edema, headache. She is feeling better but still very weak w/ some cough/congesiton .Denies chest pain,  orthopnea, hemoptysis, fever, n/v/d, edema, headache. She has smoked since age 53. Father had severe COPD. She has smoked since discharge. Is on nicotine patches presently.   Preventive Screening-Counseling & Management  Alcohol-Tobacco     Smoking Status: quit  Medications Prior to Update: 1)  Metformin Hcl 500 Mg  Tabs (Metformin Hcl) 2)  Advair Diskus 250-50 Mcg/dose  Misc (Fluticasone-Salmeterol) 3)  Vicodin .Marland Kitchen.. 1 By Mouth Q 6 As Needed  Current Medications (verified): 1)  Metformin Hcl 500 Mg  Tabs (Metformin Hcl) 2)  Advair Diskus 250-50 Mcg/dose  Misc (Fluticasone-Salmeterol) 3)  Prednisone 10 Mg Tabs (Prednisone) .... Taper As Directed Per [facility] Discharge 4)  Avelox 400 Mg Tabs (Moxifloxacin Hcl) .... Take 1 Tablet By Mouth Once A Day 5)  Ventolin Hfa 108  (90 Base) Mcg/act Aers (Albuterol Sulfate) .... Inhale 2 Puffs Every Four Hours As Needed 6)  Multivitamins   Tabs (Multiple Vitamin) .... Take 1 Tablet By Mouth Once A Day 7)  Advair Diskus 250-50 Mcg/dose Aepb (Fluticasone-Salmeterol) .... Inhale 1 Puff Two Times A Day  Allergies (verified): No Known Drug Allergies  Past History:  Past Surgical History: Last updated: 06/29/2006 Arthroscopy, bith knees - 07/31/2003, Colposcopy (CIN 1) - 08/01/2003, Gallbladder surgery - 07/31/2003  Family History: Last updated: 09/26/2007 Diabetes 1st degree Family History of Diabetes Hx, family, asthma  Social History: Last updated: 08/04/2009 lives with partner,Has 2 children (ages 110 and 26) who lives with her brother.  Smoked 1/2 ppd started at age 70yrs.   No IV drug use. social alcohol use no caffeine  Risk Factors: Caffeine Use: 0 (09/26/2007)  Past Medical History: 1. Arthritis 2. diabetic 3. Acute hypercarbic respiratory failure w/ COPD 2/2 PNA-vent support.  . (07/19/09) 4. COPD --Alpha 1-antitrypsin were >301.         --PFTs>>  Past Pulmonary History:  Pulmonary History: Acute hypercarbic respiratory failure w/ COPD 2/2 PNA-vent support.  . (07/19/09)  COPD --Alpha 1-antitrypsin were >301.         --PFTs>>  Social History: lives with partner,Has 2 children (ages 34 and 73) who lives with her brother.  Smoked 1/2 ppd started at age 59yrs.   No IV drug use. social alcohol use no caffeineSmoking Status:  quit  Review of Systems      See HPI  Vital Signs:  Patient profile:   40 year old  female Height:      60 inches Weight:      79.50 pounds BMI:     15.58 O2 Sat:      93 % on Room air Temp:     99.4 degrees F oral Pulse rate:   104 / minute BP sitting:   106 / 72  (right arm) Cuff size:   small  Vitals Entered By: Boone Master CNA (August 04, 2009 12:13 PM)  O2 Flow:  Room air CC: post hosp follow up  Is Patient Diabetic? Yes Comments Medications reviewed with  patient Daytime contact number verified with patient. Boone Master CNA  August 04, 2009 12:13 PM    Physical Exam  Additional Exam:  GEN: A/Ox3; pleasant , NAD, very thin HEENT:  [place]/AT, , EACs-clear, TMs-wnl, NOSE-clear, THROAT-clear, poor dental care NECK:  Supple w/ fair ROM; no JVD; normal carotid impulses w/o bruits; no thyromegaly or nodules palpated; no lymphadenopathy. RESP  Decreased BS in bases  CARD:  RRR, no m/r/g   GI:   Soft & nt; nml bowel sounds; no organomegaly or masses detected. Musco: Warm bil,  no calf tenderness edema, clubbing, pulses intact, no edema Neuro: intact    Impression & Recommendations:  Problem # 1:  COPD (ICD-496) Recent flare w/ Hypercarbic RF 2/2 PNA requirng vent support. Now improved.  O2 sat at rest >90%, cont on O2 w/ actiivty and at bedtime finish prednisone and avelox.  follow up 2 weeks for PFTs to establish baseline lung fxn.  Alpha 1 was >301 .   Medications Added to Medication List This Visit: 1)  Prednisone 10 Mg Tabs (Prednisone) .... Taper as directed per hosp discharge 2)  Avelox 400 Mg Tabs (Moxifloxacin hcl) .... Take 1 tablet by mouth once a day 3)  Ventolin Hfa 108 (90 Base) Mcg/act Aers (Albuterol sulfate) .... Inhale 2 puffs every four hours as needed 4)  Multivitamins Tabs (Multiple vitamin) .... Take 1 tablet by mouth once a day 5)  Advair Diskus 250-50 Mcg/dose Aepb (Fluticasone-salmeterol) .... Inhale 1 puff two times a day  Complete Medication List: 1)  Metformin Hcl 500 Mg Tabs (Metformin hcl) 2)  Advair Diskus 250-50 Mcg/dose Misc (Fluticasone-salmeterol) 3)  Prednisone 10 Mg Tabs (Prednisone) .... Taper as directed per hosp discharge 4)  Avelox 400 Mg Tabs (Moxifloxacin hcl) .... Take 1 tablet by mouth once a day 5)  Ventolin Hfa 108 (90 Base) Mcg/act Aers (Albuterol sulfate) .... Inhale 2 puffs every four hours as needed 6)  Multivitamins Tabs (Multiple vitamin) .... Take 1 tablet by mouth once a day 7)  Advair  Diskus 250-50 Mcg/dose Aepb (Fluticasone-salmeterol) .... Inhale 1 puff two times a day  Other Orders: Est. Patient Level III (44010)  Patient Instructions: 1)  Finish prednisone as directed.  2)  Return in 2 weeks w/ PFTs Dr. Vassie Loll  3)  Wear O2 with acitivity and at bedtime , may take off at rest.  4)  Please contact office for sooner follow up if symptoms do not improve or worsen    Immunization History:  Influenza Immunization History:    Influenza:  historical (01/31/2008)   Appended Document: O2 portable request

## 2010-06-03 NOTE — Assessment & Plan Note (Signed)
Summary: NP follow up - COPD   CC:  follow up .  History of Present Illness: 40 yo female seen for initial pulmonary consult during hospitalization 07/19/09 for Acute Hypercarbic Resp Failure 2/2 PNA requiring vent support.  She was admitted   07/19/2009-  07/28/2009 for Acute hypercarbic respiratory failure and underlying chronic  obstructive pulmonary disease.and  Pneumonia. She was admitted w/ COPD exacerbation noted to have hypercarbic RF on admission required intubation.  Was tx w/ IV abx, steroids, she was tx w/ Tamiflu as well. Cx showed H. Influ-beta lact neg. other cx were neg. Alpha 1-antitrypsin were >301. CXR showed NAD. She did have qutie a bit of agitation/anxiety during admission. She did self extubated on 07/24/09. She was discharged on Avelox and prednisone taper. Discharged on O2 . Has home health assistance at present.  August 19, 2009 4:29 PM  Accompanied by partner - reviewed pFTs FEV1 0.84 (34%)  Denies chest pain,  orthopnea, hemoptysis, fever, n/v/d, edema, headache. She is feeling better but still very weak w/ some cough/congesiton .Denies chest pain,  orthopnea, hemoptysis, fever, n/v/d, edema, headache. She has smoked since age 8. Father had severe COPD. She has smoked since discharge - now 2-3 cigs/d. Is on nicotine patches presently.   October 19, 2009--Presents for follow up. Says she has been doing ok until last few days when she developed a cough and congestion. She continues to smoke unfortunately not ready to quit yet. Does complain of 3 days of cough, congestion , green mucus. No otc used. Denies chest pain,  orthopnea, hemoptysis, fever, n/v/d, edema, headache.     Medications Prior to Update: 1)  Ventolin Hfa 108 (90 Base) Mcg/act Aers (Albuterol Sulfate) .... Inhale 2 Puffs Every Four Hours As Needed 2)  Multivitamins   Tabs (Multiple Vitamin) .... Take 1 Tablet By Mouth Once A Day 3)  Advair Diskus 250-50 Mcg/dose Aepb (Fluticasone-Salmeterol) .... Inhale 1 Puff  Two Times A Day  Current Medications (verified): 1)  Ventolin Hfa 108 (90 Base) Mcg/act Aers (Albuterol Sulfate) .... Inhale 2 Puffs Every Four Hours As Needed 2)  Multivitamins   Tabs (Multiple Vitamin) .... Take 1 Tablet By Mouth Once A Day 3)  Advair Diskus 250-50 Mcg/dose Aepb (Fluticasone-Salmeterol) .... Inhale 1 Puff Two Times A Day 4)  Oxygen 2l/min .... At Bedtime 5)  Albuterol Sulfate (2.5 Mg/43ml) 0.083% Nebu (Albuterol Sulfate) .... Inhale 1 Vial Via Hhn Every 6 Hours  Allergies (verified): No Known Drug Allergies  Past History:  Past Medical History: Last updated: 08/19/2009 1. Arthritis 2. diabetic 3. Acute hypercarbic respiratory failure w/ COPD 2/2 PNA-vent support.  . (07/19/09) 4. COPD --Alpha 1-antitrypsin were >301.         --PFTs>>FEV1 34% (0.84)  Past Surgical History: Last updated: 06/29/2006 Arthroscopy, bith knees - 07/31/2003, Colposcopy (CIN 1) - 08/01/2003, Gallbladder surgery - 07/31/2003  Family History: Last updated: 09/26/2007 Diabetes 1st degree Family History of Diabetes Hx, family, asthma  Social History: Last updated: 08/04/2009 lives with partner,Has 2 children (ages 65 and 73) who lives with her brother.  Smoked 1/2 ppd started at age 54yrs.   No IV drug use. social alcohol use no caffeine  Risk Factors: Caffeine Use: 0 (09/26/2007)  Risk Factors: Smoking Status: quit (08/04/2009)  Past Pulmonary History:  Pulmonary History: Acute hypercarbic respiratory failure w/ COPD 2/2 PNA-vent support.  . (07/19/09)  COPD --Alpha 1-antitrypsin were >301.         --PFTs>>FEV1 34% (0.84)  Review of Systems  See HPI  Vital Signs:  Patient profile:   40 year old female Height:      60 inches Weight:      78 pounds BMI:     15.29 O2 Sat:      93 % on Room air Temp:     99.2 degrees F oral Pulse rate:   78 / minute BP sitting:   114 / 82  (left arm) Cuff size:   small  Vitals Entered By: Boone Master CNA/MA (October 19, 2009 9:56  AM)  O2 Flow:  Room air CC: follow up  Is Patient Diabetic? No Comments Medications reviewed with patient Daytime contact number verified with patient. Boone Master CNA/MA  October 19, 2009 10:03 AM    Physical Exam  Additional Exam:  GEN: A/Ox3; pleasant , NAD, very thin HEENT:  [place]/AT, , EACs-clear, TMs-wnl, NOSE-clear, THROAT-clear, poor dental care NECK:  Supple w/ fair ROM; no JVD; normal carotid impulses w/o bruits; no thyromegaly or nodules palpated; no lymphadenopathy. RESP  Coarse BS w/ few rhonchi CARD:  RRR, no m/r/g   Musco: Warm bil,  no calf tenderness edema, clubbing, pulses intact, no edema    Impression & Recommendations:  Problem # 1:  COPD (ICD-496) Mild flare REC:  Omnicef 300mg  two times a day for 7 days  Mucinex DM two times a day as needed cough/congesiton  Increase fluids.  Please continue to consider stopping smoking -this is the most important thing you need to do.  Please contact office for sooner follow up if symptoms do not improve or worsen  follow up Dr. Vassie Loll in 2-3 months   Medications Added to Medication List This Visit: 1)  Oxygen 2l/min  .... At bedtime 2)  Albuterol Sulfate (2.5 Mg/52ml) 0.083% Nebu (Albuterol sulfate) .... Inhale 1 vial via hhn every 6 hours 3)  Cefdinir 300 Mg Caps (Cefdinir) .Marland Kitchen.. 1 by mouth two times a day  Complete Medication List: 1)  Ventolin Hfa 108 (90 Base) Mcg/act Aers (Albuterol sulfate) .... Inhale 2 puffs every four hours as needed 2)  Multivitamins Tabs (Multiple vitamin) .... Take 1 tablet by mouth once a day 3)  Advair Diskus 250-50 Mcg/dose Aepb (Fluticasone-salmeterol) .... Inhale 1 puff two times a day 4)  Oxygen 2l/min  .... At bedtime 5)  Albuterol Sulfate (2.5 Mg/20ml) 0.083% Nebu (Albuterol sulfate) .... Inhale 1 vial via hhn every 6 hours 6)  Cefdinir 300 Mg Caps (Cefdinir) .Marland Kitchen.. 1 by mouth two times a day  Other Orders: Est. Patient Level III (40981)  Patient Instructions: 1)  Omnicef 300mg  two  times a day for 7 days  2)  Mucinex DM two times a day as needed cough/congesiton  3)  Increase fluids.  4)  Please continue to consider stopping smoking -this is the most important thing you need to do.  5)  Please contact office for sooner follow up if symptoms do not improve or worsen  6)  follow up Dr. Vassie Loll in 2-3 months  Prescriptions: VENTOLIN HFA 108 (90 BASE) MCG/ACT AERS (ALBUTEROL SULFATE) Inhale 2 puffs every four hours as needed  #1 x 2   Entered and Authorized by:   Rubye Oaks NP   Signed by:   Benancio Osmundson NP on 10/19/2009   Method used:   [facility]        [facility]  [address] [id number]       [address] [facility]       [place]  [place], [place]  [address]       Ph: [phone]       Fax: [phone]   RxID:   [id number] ADVAIR DISKUS 250-50 MCG/DOSE AEPB (FLUTICASONE-SALMETEROL) Inhale 1 puff two times a day  #1 x 5   Entered and Authorized by:   Rubye Oaks NP   Signed by:   Isatou Agredano NP on 10/19/2009   Method used:   [facility]        [facility]  [address] [id number]       [address]       [place], [place]  [address]       Ph: [phone]       Fax: [phone]   RxID:   [id number] CEFDINIR 300 MG CAPS (CEFDINIR) 1 by mouth two times a day  #14 x 0   Entered and Authorized by:   Rubye Oaks NP   Signed by:   Tennile Styles NP on 10/19/2009   Method used:   [facility]        [facility]  [address] [id number]       [address]       [place], [place]  [address]       Ph: [phone]       Fax: [phone]   RxID:   [id number]

## 2010-07-05 ENCOUNTER — Encounter: Payer: Self-pay | Admitting: Adult Health

## 2010-07-05 ENCOUNTER — Telehealth (INDEPENDENT_AMBULATORY_CARE_PROVIDER_SITE_OTHER): Payer: Self-pay | Admitting: *Deleted

## 2010-07-05 ENCOUNTER — Ambulatory Visit (INDEPENDENT_AMBULATORY_CARE_PROVIDER_SITE_OTHER): Payer: Self-pay | Admitting: Adult Health

## 2010-07-05 DIAGNOSIS — J449 Chronic obstructive pulmonary disease, unspecified: Secondary | ICD-10-CM

## 2010-07-05 DIAGNOSIS — Z9981 Dependence on supplemental oxygen: Secondary | ICD-10-CM

## 2010-07-13 NOTE — Assessment & Plan Note (Signed)
Summary: NP follow up - COPD   Primary Provider/Referring Provider:  Claudie Revering  CC:  3 month follow up.  requests refill on albuterol neb.  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  Was tx w/ IV abx, steroids, she was tx w/ Tamiflu as well.  H. Influ-beta lact neg. other cx were neg. Alpha 1-antitrypsin were >301. CXR showed NAD. She did have qutie a bit of agitation/anxiety during admission.  She was discharged on Avelox and prednisone taper. Discharged on O2 . Has home health assistance at present.  August 19, 2009 4:29 PM  Accompanied by partner - reviewed pFTs FEV1 0.84 (34%) She is feeling better but still very weak w/ some cough/congesiton. She has smoked since age 18. Father had severe COPD. She has smoked since discharge - now 2-3 cigs/d. Is on nicotine patches .  December 03, 2009 11:21 AM  In with mother & friend - cat scratches over her face, c/o green phlegm, cough , dyspnea, received cefdinir in june. Continues to smoke 4 cigs/ d - they smoke outside.   April 05, 2010 1:57 PM  Off advair , now on symbicort (red inhaler - confirmed) -, Breathing better,   she reports  quit in aug'11 but office staff saw her smoking Worse in am, O2 helps but does not use every night  July 05, 2010 --Returns for 3 month follow up. We discussed her meds it appears she is not taking her inhalers as recommended. She does have medicaid and says she can afford the $3 co pay for inhalers. She says she is not smoking however smells strong of smoke. Gets winded easily . NO increased cough or congestion.  Last cxr 11/11- copd changes. Denies chest pain,  orthopnea, hemoptysis, fever, n/v/d, edema, headache.    Preventive Screening-Counseling & Management  Alcohol-Tobacco     Smoking Status: quit     Year Quit: 01/2010  Medications Prior  to Update: 1)  Hydrocodone-Acetaminophen 5-500 Mg Tabs (Hydrocodone-Acetaminophen) .... As Needed For Pain 2)  Albuterol Sulfate (2.5 Mg/79ml) 0.083% Nebu (Albuterol Sulfate) .... As Needed 3)  Ventolin Hfa 108 (90 Base) Mcg/act Aers (Albuterol Sulfate) .... Inhale 2 Puffs Every Four Hours As Needed 4)  Oxygen 2l/min .... At Bedtime  Current Medications (verified): 1)  Ventolin Hfa 108 (90 Base) Mcg/act Aers (Albuterol Sulfate) .... Inhale 2 Puffs Every Four Hours As Needed 2)  Oxygen 2l/min .... At Bedtime 3)  Hydrocodone-Acetaminophen 5-500 Mg Tabs (Hydrocodone-Acetaminophen) .... As Needed For Pain 4)  Albuterol Sulfate (2.5 Mg/37ml) 0.083% Nebu (Albuterol Sulfate) .... As Needed  Allergies (verified): No Known Drug Allergies  Past History:  Past Medical History: Last updated: 08/19/2009 1. Arthritis 2. diabetic 3. Acute hypercarbic respiratory failure w/ COPD 2/2 PNA-vent support.  . (07/19/09) 4. COPD --Alpha 1-antitrypsin were >301.         --PFTs>>FEV1 34% (0.84)  Past Surgical History: Last updated: 06/29/2006 Arthroscopy, bith knees - 07/31/2003, Colposcopy (CIN 1) - 08/01/2003, Gallbladder surgery - 07/31/2003  Family History: Last updated: 09/26/2007 Diabetes 1st degree Family History of Diabetes Hx, family, asthma  Social History: Last updated: 07/05/2010 lives with partner,Has 2 children (ages 41 and 80) who lives with her brother.  quit smoking 01/2010 - Smoked 1/2 ppd started at age 39yrs.   No IV drug use. social alcohol use-- no  caffeine  Risk Factors: Smoking Status: quit (07/05/2010) Packs/Day: <0.25 (12/03/2009)  Past Pulmonary History:  Pulmonary History: Acute hypercarbic respiratory failure w/ COPD 2/2 PNA-vent support.  . (07/19/09)  COPD --Alpha 1-antitrypsin were >301.         --PFTs>>FEV1 34% (0.84)  Social History: lives with partner,Has 2 children (ages 37 and 56) who lives with her brother.  quit smoking 01/2010 - Smoked 1/2 ppd started at  age 45yrs.   No IV drug use. social alcohol use-- no caffeineSmoking Status:  quit  Review of Systems      See HPI  Vital Signs:  Patient profile:   40 year old female Height:      60 inches Weight:      81.50 pounds BMI:     15.97 O2 Sat:      91 % on Room air Temp:     96.7 degrees F oral Pulse rate:   92 / minute BP sitting:   126 / 74  (left arm) Cuff size:   small  Vitals Entered By: Boone Master CNA/MA (July 05, 2010 10:39 AM)  O2 Flow:  Room air CC: 3 month follow up.  requests refill on albuterol neb Is Patient Diabetic? No Comments Medications reviewed with patient Daytime contact number verified with patient. Boone Master CNA/MA  July 05, 2010 10:40 AM    Physical Exam  Additional Exam:  GEN: A/Ox3;  , NAD, very thin dishelved, smells of smoke  HEENT:  [place]/AT, , EACs-clear, TMs-wnl, NOSE-clear, THROAT-clear, poor dental care NECK:  Supple w/ fair ROM; no JVD; normal carotid impulses w/o bruits; no thyromegaly or nodules palpated; no lymphadenopathy. RESP  Coarse BS w/ few rhonchi, no wheezing  CARD:  RRR, no m/r/g   Musco: Warm bil,  no calf tenderness edema, clubbing, pulses intact, no edema, leg brace on left  Skin intact     Impression & Recommendations:  Problem # 1:  COPD (ICD-496) Advised on med compliance  plan  Take Symbicort 160/4.15mcg 2 puffs two times a day --brush/rinse/garlge after use.  Mucinex DM two times a day as needed cough/congestion  Wear O2 2 l/m at bedtime and as needed with activity  follow up Dr. Vassie Loll in 3 months  Please contact office for sooner follow up if symptoms do not improve or worsen   Orders: Est. Patient Level III ([address])  Medications Added to Medication List This Visit: 1)  Symbicort 160-4.5 Mcg/act Aero (Budesonide-formoterol fumarate) .... 2 puffs two times a day 2)  Albuterol Sulfate (2.5 Mg/11ml) 0.083% Nebu (Albuterol sulfate) .Marland Kitchen.. 1 vial via neb every 4-6 hr as needed for wheezing /shortness of  breath  Patient Instructions: 1)  Take Symbicort 160/4.53mcg 2 puffs two times a day --brush/rinse/garlge after use.  2)  Mucinex DM two times a day as needed cough/congestion  3)  Wear O2 2 l/m at bedtime and as needed with activity  4)  follow up Dr. Vassie Loll in 3 months  5)  Please contact office for sooner follow up if symptoms do not improve or worsen  Prescriptions: ALBUTEROL SULFATE (2.5 MG/3ML) 0.083% NEBU (ALBUTEROL SULFATE) 1 vial via NEB every 4-6 hr as needed for wheezing /shortness of breath  #90 x 5   Entered and Authorized by:   Rubye Oaks NP   Signed by:   Kailani Brass NP on 07/05/2010   Method used:   [facility]        [facility]  [address] [id number]  [address]       [place], [place]  [address]       Ph: [phone]       Fax: [phone]   RxID:   [id number] SYMBICORT 160-4.5 MCG/ACT AERO (BUDESONIDE-FORMOTEROL FUMARATE) 2 puffs two times a day  #1 x 5   Entered and Authorized by:   Rubye Oaks NP   Signed by:   Sherelle Castelli NP on 07/05/2010   Method used:   [facility]        [facility]  [address] [id number]       [address]       [place], [place]  [address]       Ph: [phone]       Fax: [phone]   RxID:   [id number]

## 2010-07-13 NOTE — Progress Notes (Signed)
Summary: did not get prescription for sprivia at todays appt.   Phone Note Call from Patient Call back at Home Phone [phone] P PH     Caller: Patient Call For: Tammy P Summary of Call: Patient phoned stated that she was seen earlier today and was suposed to get a prescription for Sprivia wanted to change her from Advir. But she did not get the prescription before she left. [facility] [address]. She can be reached at [phone] Initial call taken by: Vedia Coffer,  July 05, 2010 3:41 PM  Follow-up for Phone Call        Called and spoke with pt and she states that TP mentioned putting her on spiriva today since she has been out of advair. Pt states TP told her she was going to give her an rx for spiriva and she did not get it. Please advise Tammy if you are okay with sending spiriva to the pharmacy [facility]  July 05, 2010 5:33 PM   Additional Follow-up for Phone Call Additional follow up Details #1::        No her instructions say Symbicort not advair --she wanted symbicort -she is not taking her meds so  bottom line is she  needs to take her assigned rx before I add anything else.  just explain to take symbicort 2 puffs two times a day  and on return we will look at spiriva  thanx  Additional Follow-up by: Rubye Oaks NP,  July 05, 2010 5:47 PM    Additional Follow-up for Phone Call Additional follow up Details #2::    Spoke with pt and notified of the above recs per TP.  Pt verbalized understanding and denied any questions.  Follow-up by: Vernie Murders,  July 05, 2010 5:53 PM

## 2010-07-14 LAB — BASIC METABOLIC PANEL
CO2: 28 mEq/L (ref 19–32)
Calcium: 8.5 mg/dL (ref 8.4–10.5)
GFR calc Af Amer: >60 mL/min (ref >60)
Glucose, Bld: 88 mg/dL (ref 70–99)
Potassium: 3.7 mEq/L (ref 3.5–5.1)
Sodium: 142 mEq/L (ref 135–145)

## 2010-07-14 LAB — BLOOD GAS, ARTERIAL
Drawn by: 326301
FIO2: 0.28 %
TCO2: 27.3 mmol/L (ref 0–100)
pCO2 arterial: 59.9 mmHg (ref 35.0–45.0)
pH, Arterial: 7.338 — ABNORMAL LOW (ref 7.350–7.400)
pO2, Arterial: 134 mmHg — ABNORMAL HIGH (ref 80.0–100.0)

## 2010-07-14 LAB — DIFFERENTIAL
Basophils Absolute: 0 10*3/uL (ref 0.0–0.1)
Lymphocytes Relative: 37 % (ref 12–46)
Lymphs Abs: 1.9 10*3/uL (ref 0.7–4.0)
Monocytes Absolute: 0.7 10*3/uL (ref 0.1–1.0)
Neutro Abs: 2.6 10*3/uL (ref 1.7–7.7)

## 2010-07-14 LAB — CBC
HCT: 47.1 % — ABNORMAL HIGH (ref 36.0–46.0)
RBC: 4.5 MIL/uL (ref 3.87–5.11)
RDW: 14.4 % (ref 11.5–15.5)
WBC: 5.3 10*3/uL (ref 4.0–10.5)

## 2010-07-14 LAB — URINALYSIS, ROUTINE W REFLEX MICROSCOPIC
Ketones, ur: NEGATIVE mg/dL
Leukocytes, UA: NEGATIVE
Nitrite: NEGATIVE
Protein, ur: NEGATIVE mg/dL

## 2010-07-14 LAB — GLUCOSE, CAPILLARY: Glucose-Capillary: 104 mg/dL — ABNORMAL HIGH (ref 70–99)

## 2010-07-15 LAB — ETHANOL

## 2010-07-26 LAB — BLOOD GAS, ARTERIAL
Acid-Base Excess: 5.2 mmol/L — ABNORMAL HIGH (ref 0.0–2.0)
Acid-Base Excess: 6.7 mmol/L — ABNORMAL HIGH (ref 0.0–2.0)
Acid-Base Excess: 8.2 mmol/L — ABNORMAL HIGH (ref 0.0–2.0)
Bicarbonate: 32.8 mEq/L — ABNORMAL HIGH (ref 20.0–24.0)
Bicarbonate: 33.4 mEq/L — ABNORMAL HIGH (ref 20.0–24.0)
Bicarbonate: 33.8 mEq/L — ABNORMAL HIGH (ref 20.0–24.0)
Bicarbonate: 33.9 mEq/L — ABNORMAL HIGH (ref 20.0–24.0)
Bicarbonate: 34.5 mEq/L — ABNORMAL HIGH (ref 20.0–24.0)
Bicarbonate: 38.2 mEq/L — ABNORMAL HIGH (ref 20.0–24.0)
Drawn by: 257701
FIO2: 0.4 %
FIO2: 0.4 %
FIO2: 0.4 %
MECHVT: 350 mL
MECHVT: 350 mL
MECHVT: 350 mL
MECHVT: 450 mL
O2 Content: 4 L/min
O2 Saturation: 93.1 %
O2 Saturation: 94.3 %
O2 Saturation: 97.2 %
O2 Saturation: 97.3 %
O2 Saturation: 98 %
O2 Saturation: 98.4 %
PEEP: 5 cmH2O
PEEP: 5 cmH2O
PEEP: 5 cmH2O
PEEP: 5 cmH2O
PEEP: 5 cmH2O
Patient temperature: 98.6
Patient temperature: 98.6
Patient temperature: 98.6
RATE: 16 resp/min
RATE: 16 resp/min
TCO2: 29.2 mmol/L (ref 0–100)
TCO2: 29.2 mmol/L (ref 0–100)
TCO2: 30.6 mmol/L (ref 0–100)
TCO2: 33.2 mmol/L (ref 0–100)
pCO2 arterial: 53.4 mmHg — ABNORMAL HIGH (ref 35.0–45.0)
pCO2 arterial: 57.7 mmHg (ref 35.0–45.0)
pCO2 arterial: 63.2 mmHg (ref 35.0–45.0)
pCO2 arterial: 75.5 mmHg (ref 35.0–45.0)
pCO2 arterial: 77.4 mmHg (ref 35.0–45.0)
pH, Arterial: 7.281 — ABNORMAL LOW (ref 7.350–7.400)
pH, Arterial: 7.314 — ABNORMAL LOW (ref 7.350–7.400)
pH, Arterial: 7.316 — ABNORMAL LOW (ref 7.350–7.400)
pH, Arterial: 7.334 — ABNORMAL LOW (ref 7.350–7.400)
pH, Arterial: 7.337 — ABNORMAL LOW (ref 7.350–7.400)
pH, Arterial: 7.426 — ABNORMAL HIGH (ref 7.350–7.400)
pO2, Arterial: 104 mmHg — ABNORMAL HIGH (ref 80.0–100.0)
pO2, Arterial: 114 mmHg — ABNORMAL HIGH (ref 80.0–100.0)
pO2, Arterial: 69.1 mmHg — ABNORMAL LOW (ref 80.0–100.0)
pO2, Arterial: 85.4 mmHg (ref 80.0–100.0)
pO2, Arterial: 93.6 mmHg (ref 80.0–100.0)

## 2010-07-26 LAB — BASIC METABOLIC PANEL
BUN: 11 mg/dL (ref 6–23)
BUN: 11 mg/dL (ref 6–23)
BUN: 5 mg/dL — ABNORMAL LOW (ref 6–23)
CO2: 32 mEq/L (ref 19–32)
CO2: 32 mEq/L (ref 19–32)
CO2: 33 mEq/L — ABNORMAL HIGH (ref 19–32)
CO2: 33 mEq/L — ABNORMAL HIGH (ref 19–32)
CO2: 40 mEq/L — ABNORMAL HIGH (ref 19–32)
Calcium: 7.3 mg/dL — ABNORMAL LOW (ref 8.4–10.5)
Calcium: 8.3 mg/dL — ABNORMAL LOW (ref 8.4–10.5)
Calcium: 8.8 mg/dL (ref 8.4–10.5)
Chloride: 102 mEq/L (ref 96–112)
Chloride: 81 mEq/L — ABNORMAL LOW (ref 96–112)
Chloride: 95 mEq/L — ABNORMAL LOW (ref 96–112)
Creatinine, Ser: 0.4 mg/dL (ref 0.4–1.2)
Creatinine, Ser: 0.41 mg/dL (ref 0.4–1.2)
Creatinine, Ser: 0.54 mg/dL (ref 0.4–1.2)
Creatinine, Ser: <0.3 mg/dL — ABNORMAL LOW (ref 0.4–1.2)
Creatinine, Ser: <0.3 mg/dL — ABNORMAL LOW (ref 0.4–1.2)
GFR calc Af Amer: >60 mL/min (ref >60)
GFR calc Af Amer: >60 mL/min (ref >60)
GFR calc non Af Amer: >60 mL/min (ref >60)
GFR calc non Af Amer: >60 mL/min (ref >60)
Glucose, Bld: 100 mg/dL — ABNORMAL HIGH (ref 70–99)
Glucose, Bld: 179 mg/dL — ABNORMAL HIGH (ref 70–99)
Glucose, Bld: 94 mg/dL (ref 70–99)
Glucose, Bld: 97 mg/dL (ref 70–99)
Potassium: 2.9 mEq/L — ABNORMAL LOW (ref 3.5–5.1)
Potassium: 4 mEq/L (ref 3.5–5.1)
Potassium: 5.4 mEq/L — ABNORMAL HIGH (ref 3.5–5.1)
Sodium: 137 mEq/L (ref 135–145)
Sodium: 137 mEq/L (ref 135–145)
Sodium: 137 mEq/L (ref 135–145)
Sodium: 139 mEq/L (ref 135–145)
Sodium: 141 mEq/L (ref 135–145)

## 2010-07-26 LAB — CULTURE, BAL-QUANTITATIVE W GRAM STAIN

## 2010-07-26 LAB — HEPATIC FUNCTION PANEL
ALT: 137 U/L — ABNORMAL HIGH (ref 0–35)
AST: 49 U/L — ABNORMAL HIGH (ref 0–37)
Albumin: 2 g/dL — ABNORMAL LOW (ref 3.5–5.2)
Bilirubin, Direct: 0.2 mg/dL (ref 0.0–0.3)

## 2010-07-26 LAB — GLUCOSE, CAPILLARY
Glucose-Capillary: 100 mg/dL — ABNORMAL HIGH (ref 70–99)
Glucose-Capillary: 109 mg/dL — ABNORMAL HIGH (ref 70–99)
Glucose-Capillary: 111 mg/dL — ABNORMAL HIGH (ref 70–99)
Glucose-Capillary: 115 mg/dL — ABNORMAL HIGH (ref 70–99)
Glucose-Capillary: 117 mg/dL — ABNORMAL HIGH (ref 70–99)
Glucose-Capillary: 117 mg/dL — ABNORMAL HIGH (ref 70–99)
Glucose-Capillary: 118 mg/dL — ABNORMAL HIGH (ref 70–99)
Glucose-Capillary: 120 mg/dL — ABNORMAL HIGH (ref 70–99)
Glucose-Capillary: 121 mg/dL — ABNORMAL HIGH (ref 70–99)
Glucose-Capillary: 121 mg/dL — ABNORMAL HIGH (ref 70–99)
Glucose-Capillary: 128 mg/dL — ABNORMAL HIGH (ref 70–99)
Glucose-Capillary: 131 mg/dL — ABNORMAL HIGH (ref 70–99)
Glucose-Capillary: 134 mg/dL — ABNORMAL HIGH (ref 70–99)
Glucose-Capillary: 139 mg/dL — ABNORMAL HIGH (ref 70–99)
Glucose-Capillary: 144 mg/dL — ABNORMAL HIGH (ref 70–99)
Glucose-Capillary: 164 mg/dL — ABNORMAL HIGH (ref 70–99)
Glucose-Capillary: 164 mg/dL — ABNORMAL HIGH (ref 70–99)
Glucose-Capillary: 166 mg/dL — ABNORMAL HIGH (ref 70–99)
Glucose-Capillary: 168 mg/dL — ABNORMAL HIGH (ref 70–99)
Glucose-Capillary: 172 mg/dL — ABNORMAL HIGH (ref 70–99)
Glucose-Capillary: 213 mg/dL — ABNORMAL HIGH (ref 70–99)
Glucose-Capillary: 216 mg/dL — ABNORMAL HIGH (ref 70–99)
Glucose-Capillary: 245 mg/dL — ABNORMAL HIGH (ref 70–99)
Glucose-Capillary: 56 mg/dL — ABNORMAL LOW (ref 70–99)
Glucose-Capillary: 61 mg/dL — ABNORMAL LOW (ref 70–99)
Glucose-Capillary: 64 mg/dL — ABNORMAL LOW (ref 70–99)
Glucose-Capillary: 74 mg/dL (ref 70–99)
Glucose-Capillary: 77 mg/dL (ref 70–99)
Glucose-Capillary: 87 mg/dL (ref 70–99)
Glucose-Capillary: 94 mg/dL (ref 70–99)
Glucose-Capillary: 96 mg/dL (ref 70–99)

## 2010-07-26 LAB — DIFFERENTIAL
Eosinophils Absolute: 0 10*3/uL (ref 0.0–0.7)
Eosinophils Relative: 0 % (ref 0–5)
Monocytes Absolute: 1.8 10*3/uL — ABNORMAL HIGH (ref 0.1–1.0)
Neutrophils Relative %: 63 % (ref 43–77)

## 2010-07-26 LAB — LEGIONELLA ANTIGEN, URINE: Legionella Antigen, Urine: NEGATIVE

## 2010-07-26 LAB — CBC
HCT: 41.8 % (ref 36.0–46.0)
HCT: 42.4 % (ref 36.0–46.0)
Hemoglobin: 12.7 g/dL (ref 12.0–15.0)
Hemoglobin: 13.4 g/dL (ref 12.0–15.0)
Hemoglobin: 13.5 g/dL (ref 12.0–15.0)
Hemoglobin: 13.6 g/dL (ref 12.0–15.0)
Hemoglobin: 13.8 g/dL (ref 12.0–15.0)
Hemoglobin: 14.1 g/dL (ref 12.0–15.0)
MCHC: 32.7 g/dL (ref 30.0–36.0)
MCHC: 32.9 g/dL (ref 30.0–36.0)
MCHC: 33.1 g/dL (ref 30.0–36.0)
MCHC: 33.3 g/dL (ref 30.0–36.0)
MCHC: 34.6 g/dL (ref 30.0–36.0)
MCHC: 34.8 g/dL (ref 30.0–36.0)
MCV: 103.6 fL — ABNORMAL HIGH (ref 78.0–100.0)
MCV: 105 fL — ABNORMAL HIGH (ref 78.0–100.0)
MCV: 105.4 fL — ABNORMAL HIGH (ref 78.0–100.0)
Platelets: 116 10*3/uL — ABNORMAL LOW (ref 150–400)
RBC: 3.53 MIL/uL — ABNORMAL LOW (ref 3.87–5.11)
RBC: 3.73 MIL/uL — ABNORMAL LOW (ref 3.87–5.11)
RBC: 3.9 MIL/uL (ref 3.87–5.11)
RBC: 3.99 MIL/uL (ref 3.87–5.11)
RBC: 4.04 MIL/uL (ref 3.87–5.11)
RBC: 4.07 MIL/uL (ref 3.87–5.11)
RBC: 4.65 MIL/uL (ref 3.87–5.11)
RDW: 15 % (ref 11.5–15.5)
RDW: 15.8 % — ABNORMAL HIGH (ref 11.5–15.5)
RDW: 16.3 % — ABNORMAL HIGH (ref 11.5–15.5)
WBC: 6 10*3/uL (ref 4.0–10.5)
WBC: 6.7 10*3/uL (ref 4.0–10.5)

## 2010-07-26 LAB — COMPREHENSIVE METABOLIC PANEL
ALT: 79 U/L — ABNORMAL HIGH (ref 0–35)
ALT: 89 U/L — ABNORMAL HIGH (ref 0–35)
ALT: 90 U/L — ABNORMAL HIGH (ref 0–35)
AST: 145 U/L — ABNORMAL HIGH (ref 0–37)
Albumin: 2 g/dL — ABNORMAL LOW (ref 3.5–5.2)
Albumin: 2.4 g/dL — ABNORMAL LOW (ref 3.5–5.2)
Alkaline Phosphatase: 82 U/L (ref 39–117)
Alkaline Phosphatase: 84 U/L (ref 39–117)
BUN: 3 mg/dL — ABNORMAL LOW (ref 6–23)
CO2: 27 mEq/L (ref 19–32)
CO2: 33 mEq/L — ABNORMAL HIGH (ref 19–32)
CO2: 38 mEq/L — ABNORMAL HIGH (ref 19–32)
Calcium: 7.6 mg/dL — ABNORMAL LOW (ref 8.4–10.5)
Calcium: 8.2 mg/dL — ABNORMAL LOW (ref 8.4–10.5)
Chloride: 96 mEq/L (ref 96–112)
Creatinine, Ser: <0.3 mg/dL — ABNORMAL LOW (ref 0.4–1.2)
GFR calc Af Amer: >60 mL/min (ref >60)
GFR calc non Af Amer: >60 mL/min (ref >60)
Glucose, Bld: 51 mg/dL — ABNORMAL LOW (ref 70–99)
Glucose, Bld: UNDETERMINED mg/dL (ref 70–99)
Potassium: 4 mEq/L (ref 3.5–5.1)
Potassium: 4.4 mEq/L (ref 3.5–5.1)
Sodium: 133 mEq/L — ABNORMAL LOW (ref 135–145)
Sodium: 133 mEq/L — ABNORMAL LOW (ref 135–145)
Sodium: 133 mEq/L — ABNORMAL LOW (ref 135–145)
Total Bilirubin: 0.9 mg/dL (ref 0.3–1.2)
Total Protein: 5.4 g/dL — ABNORMAL LOW (ref 6.0–8.3)

## 2010-07-26 LAB — AFB CULTURE WITH SMEAR (NOT AT ARMC): Acid Fast Smear: NONE SEEN

## 2010-07-26 LAB — URINALYSIS, ROUTINE W REFLEX MICROSCOPIC
Glucose, UA: NEGATIVE mg/dL
Protein, ur: 100 mg/dL — AB

## 2010-07-26 LAB — GLUCOSE, RANDOM: Glucose, Bld: 145 mg/dL — ABNORMAL HIGH (ref 70–99)

## 2010-07-26 LAB — BRAIN NATRIURETIC PEPTIDE: Pro B Natriuretic peptide (BNP): 170 pg/mL — ABNORMAL HIGH (ref 0.0–100.0)

## 2010-07-26 LAB — MAGNESIUM
Magnesium: 1.9 mg/dL (ref 1.5–2.5)
Magnesium: 2 mg/dL (ref 1.5–2.5)
Magnesium: UNDETERMINED mg/dL (ref 1.5–2.5)

## 2010-07-26 LAB — TROPONIN I: Troponin I: 0.19 ng/mL — ABNORMAL HIGH (ref 0.00–0.06)

## 2010-07-26 LAB — CARDIAC PANEL(CRET KIN+CKTOT+MB+TROPI)
CK, MB: 0.7 ng/mL (ref 0.3–4.0)
Relative Index: INVALID (ref 0.0–2.5)
Troponin I: 0.05 ng/mL (ref 0.00–0.06)

## 2010-07-26 LAB — RAPID URINE DRUG SCREEN, HOSP PERFORMED
Cocaine: NOT DETECTED
Opiates: NOT DETECTED

## 2010-07-26 LAB — CK TOTAL AND CKMB (NOT AT ARMC)
CK, MB: 2.3 ng/mL (ref 0.3–4.0)
Relative Index: INVALID (ref 0.0–2.5)

## 2010-07-26 LAB — INFLUENZA A H1N1
Influenza A RNA: NOT DETECTED
Swine Influenza H1 Gene: NOT DETECTED

## 2010-07-26 LAB — CULTURE, BLOOD (ROUTINE X 2)
Culture: NO GROWTH
Culture: NO GROWTH

## 2010-07-26 LAB — PNEUMOCYSTIS JIROVECI SMEAR BY DFA: Pneumocystis jiroveci Ag: NOT DETECTED

## 2010-07-26 LAB — URINE MICROSCOPIC-ADD ON

## 2010-07-26 LAB — URINE CULTURE: Colony Count: NO GROWTH

## 2010-07-26 LAB — VIRUS CULTURE

## 2010-07-26 LAB — LACTIC ACID, PLASMA: Lactic Acid, Venous: 4.5 mmol/L — ABNORMAL HIGH (ref 0.5–2.2)

## 2010-07-26 LAB — D-DIMER, QUANTITATIVE: D-Dimer, Quant: 1.07 ug/mL-FEU — ABNORMAL HIGH (ref 0.00–0.48)

## 2010-07-26 LAB — FUNGUS CULTURE W SMEAR: Fungal Smear: NONE SEEN

## 2010-07-26 LAB — ALPHA-1-ANTITRYPSIN: A-1 Antitrypsin, Ser: 301 mg/dL — ABNORMAL HIGH (ref 83–200)

## 2010-07-26 LAB — STREP PNEUMONIAE URINARY ANTIGEN: Strep Pneumo Urinary Antigen: NEGATIVE

## 2010-07-26 LAB — PHOSPHORUS
Phosphorus: 2.8 mg/dL (ref 2.3–4.6)
Phosphorus: 4.2 mg/dL (ref 2.3–4.6)

## 2010-08-04 LAB — CBC
HCT: 42.9 % (ref 36.0–46.0)
Hemoglobin: 13.5 g/dL (ref 12.0–15.0)
Hemoglobin: 14.9 g/dL (ref 12.0–15.0)
MCHC: 34.3 g/dL (ref 30.0–36.0)
MCHC: 34.7 g/dL (ref 30.0–36.0)
MCV: 103.3 fL — ABNORMAL HIGH (ref 78.0–100.0)
Platelets: 182 10*3/uL (ref 150–400)
Platelets: 187 K/uL (ref 150–400)
RBC: 4.15 MIL/uL (ref 3.87–5.11)
RDW: 14.5 % (ref 11.5–15.5)
RDW: 14.9 % (ref 11.5–15.5)
WBC: 5.9 K/uL (ref 4.0–10.5)

## 2010-08-04 LAB — BASIC METABOLIC PANEL
CO2: 28 mEq/L (ref 19–32)
Calcium: 8.8 mg/dL (ref 8.4–10.5)
Chloride: 98 mEq/L (ref 96–112)
GFR calc Af Amer: >60 mL/min (ref >60)
Glucose, Bld: 103 mg/dL — ABNORMAL HIGH (ref 70–99)
Sodium: 139 mEq/L (ref 135–145)

## 2010-08-04 LAB — DIFFERENTIAL
Basophils Absolute: 0 10*3/uL (ref 0.0–0.1)
Basophils Relative: 1 % (ref 0–1)
Eosinophils Absolute: 0 10*3/uL (ref 0.0–0.7)
Eosinophils Relative: 1 % (ref 0–5)
Lymphocytes Relative: 41 % (ref 12–46)
Lymphs Abs: 2.4 K/uL (ref 0.7–4.0)
Monocytes Absolute: 0.6 10*3/uL (ref 0.1–1.0)
Monocytes Relative: 10 % (ref 3–12)
Neutro Abs: 2.8 K/uL (ref 1.7–7.7)
Neutrophils Relative %: 49 % (ref 43–77)

## 2010-08-04 LAB — TSH: TSH: 0.524 u[IU]/mL (ref 0.350–4.500)

## 2010-08-04 LAB — GLUCOSE, CAPILLARY
Glucose-Capillary: 128 mg/dL — ABNORMAL HIGH (ref 70–99)
Glucose-Capillary: 134 mg/dL — ABNORMAL HIGH (ref 70–99)
Glucose-Capillary: 142 mg/dL — ABNORMAL HIGH (ref 70–99)
Glucose-Capillary: 143 mg/dL — ABNORMAL HIGH (ref 70–99)
Glucose-Capillary: 145 mg/dL — ABNORMAL HIGH (ref 70–99)
Glucose-Capillary: 162 mg/dL — ABNORMAL HIGH (ref 70–99)
Glucose-Capillary: 171 mg/dL — ABNORMAL HIGH (ref 70–99)
Glucose-Capillary: 221 mg/dL — ABNORMAL HIGH (ref 70–99)

## 2010-08-04 LAB — HEMOGLOBIN A1C
Hgb A1c MFr Bld: 5.1 % (ref 4.6–6.1)
Mean Plasma Glucose: 100 mg/dL

## 2010-08-04 LAB — COMPREHENSIVE METABOLIC PANEL
ALT: 26 U/L (ref 0–35)
Albumin: 3.1 g/dL — ABNORMAL LOW (ref 3.5–5.2)
Calcium: 9.2 mg/dL (ref 8.4–10.5)
Glucose, Bld: 178 mg/dL — ABNORMAL HIGH (ref 70–99)
Sodium: 137 mEq/L (ref 135–145)
Total Protein: 6.4 g/dL (ref 6.0–8.3)

## 2010-08-04 LAB — BASIC METABOLIC PANEL WITH GFR
BUN: 1 mg/dL — ABNORMAL LOW (ref 6–23)
Creatinine, Ser: 0.34 mg/dL — ABNORMAL LOW (ref 0.4–1.2)
GFR calc non Af Amer: >60 mL/min (ref >60)
Potassium: 3.1 meq/L — ABNORMAL LOW (ref 3.5–5.1)

## 2010-08-04 LAB — MAGNESIUM
Magnesium: 1.7 mg/dL (ref 1.5–2.5)
Magnesium: 2 mg/dL (ref 1.5–2.5)

## 2010-08-04 LAB — ETHANOL: Alcohol, Ethyl (B): 111 mg/dL — ABNORMAL HIGH (ref 0–10)

## 2010-08-06 LAB — BASIC METABOLIC PANEL
CO2: 26 mEq/L (ref 19–32)
Chloride: 99 mEq/L (ref 96–112)
GFR calc Af Amer: >60 mL/min (ref >60)
Potassium: 3.1 mEq/L — ABNORMAL LOW (ref 3.5–5.1)
Sodium: 138 mEq/L (ref 135–145)

## 2010-08-06 LAB — DIFFERENTIAL
Basophils Relative: 1 % (ref 0–1)
Eosinophils Absolute: 0.1 10*3/uL (ref 0.0–0.7)
Lymphocytes Relative: 56 % — ABNORMAL HIGH (ref 12–46)
Monocytes Absolute: 0.6 10*3/uL (ref 0.1–1.0)
Neutro Abs: 0.9 10*3/uL — ABNORMAL LOW (ref 1.7–7.7)

## 2010-08-06 LAB — RAPID URINE DRUG SCREEN, HOSP PERFORMED
Barbiturates: NOT DETECTED
Opiates: NOT DETECTED

## 2010-08-06 LAB — CBC
Hemoglobin: 15 g/dL (ref 12.0–15.0)
MCHC: 35.1 g/dL (ref 30.0–36.0)
MCV: 101.7 fL — ABNORMAL HIGH (ref 78.0–100.0)
RBC: 4.21 MIL/uL (ref 3.87–5.11)

## 2010-09-14 NOTE — H&P (Signed)
[name], [name]            ACCOUNT NO.:  [id number]   MEDICAL RECORD NO.:  [id number]          PATIENT TYPE:  INP   LOCATION:  1526                         FACILITY:  [facility]   PHYSICIAN:  Altha Harm, MDDATE OF BIRTH:  1970/09/16   DATE OF ADMISSION:  02/17/2007  DATE OF DISCHARGE:                              HISTORY & PHYSICAL   CHIEF COMPLAINT:  Shortness of breath.   HISTORY OF PRESENT ILLNESS:  This is a 40 year old smoker who presents  to the emergency room with complaints of shortness of breath getting  progressively worse over five days.  The patient describes dyspnea on  exertion over the past 2-3 days.  The patient denies any fever or  chills.  She does have a cough that was initially nonproductive,  however, she states that today it has been productive of greenish  sputum.  The patient also complains of emesis x5 days, however, she is  unable to expand upon the timing of the emesis and the frequency.  She  denies any chest pain.  She denies any palpitations.  She denies any  fever or chills.  The patient smokes about 1/2 pack per day and  continues to smoke presently.   PAST MEDICAL HISTORY:  Significant for diabetes type 2.   FAMILY HISTORY:  Significant for seizures.   SOCIAL HISTORY:  The patient smokes half pack per day for 22 years.  She  uses alcohol occasionally.  She denies any drug use.  The patient is  disable, cause unknown.   MEDICATIONS:  The patient cannot remember the name of her medications  and states that she takes pills for her diabetes.   ALLERGIES:  No known drug allergies.   PRIMARY CARE PHYSICIAN:  Windle Guard, M.D.   REVIEW OF SYSTEMS:  12 systems reviewed and all systems negative except  as noted in the HPI.   STUDIES:  In the emergency room, the patient had a chest x-ray which  shows mild peribronchial thickening.  Hemogram showed a white blood cell  count of 6.5, hemoglobin 15.5, hematocrit 43.9, and platelet count  125.  Sodium 140, potassium 3.4, chloride 97, bicarb 31, BUN less than 1,  creatinine 0.46.  ABG was ordered, however, the patient would not allow  the ABG to be performed.   PHYSICAL EXAMINATION:  GENERAL:  The patient was lying in bed in no  acute distress.  She appears nontoxic.  VITAL SIGNS:  Temperature 99.3, blood pressure 98/52, heart rate 101,  respiratory rate 18, O2 saturations are 91% on 2 liters.  Please note  the patient's O2 saturations dropped to 70% on room air.  HEENT:  She is normocephalic and atraumatic.  Pupils equal, round, and  reactive to light and accommodation.  Extraocular movements are intact.  Oropharynx, the patient has poor dentition.  Mucosa is moist and no  exudate, erythema, or lesions noted.  NECK:  Trachea midline.  No masses, no thyromegaly, no JVD, and no  carotid bruit.  LUNGS:  The patient has normal respiratory effort and equal excursion  bilaterally.  She has mild diffuse wheezes, no rhonchi or  rales noted.  CARDIOVASCULAR:  She has a normal S1 and S2.  No murmurs, rubs, or  gallops noted.  PMI is nondisplaced.  No heaves or thrills on palpation.  ABDOMEN:  Soft, obese, nontender, and nondistended.  No masses and no  hepatosplenomegaly noted.  Physical examination otherwise normal.   ASSESSMENT:  This is a patient who presents with reactive airway disease  and likely acute exacerbation of COPD given the patient's smoking  history.  The patient shows no evidence of pneumonia, however, will be  treated as an acute bronchitis.  We will give the patient steroids.  The  patient will be given beta agonists and also placed on Avelox.  The  patient in terms of tobacco use, she will be given Nicotine patch and  she will be given Robitussin for cough.  The patient has a marginal  blood pressure, however, in review of the patient's records, this  appears to be normal for the patient.  She will be supported with IV  fluids until she can tolerate her oral  intake without any difficulty.      Altha Harm, MD  Electronically Signed     MAM/MEDQ  D:  02/18/2007  T:  02/19/2007  Job:  [id number]   cc:   Windle Guard, M.D.  Fax: [phone]

## 2010-09-14 NOTE — H&P (Signed)
[name], [name]            ACCOUNT NO.:  [id number]   MEDICAL RECORD NO.:  [id number]          PATIENT TYPE:  EMS   LOCATION:  ED                           [facility]ACILITY:  [facility]   PHYSICIAN:  Elliot Cousin, M.D.    DATE OF BIRTH:  [birth year]   DATE OF ADMISSION:  06/12/2007  DATE OF DISCHARGE:                              HISTORY & PHYSICAL   PRIMARY CARE PHYSICIAN:  Windle Guard, M.D.  The patient is unassigned  to [place].   CHIEF COMPLAINT:  Shortness of breath, cough, nausea, vomiting and  diarrhea.   HISTORY OF PRESENT ILLNESS:  The patient is a 40 year old woman with a  past medical history significant for COPD, asthma, type 2 diabetes  mellitus, and tobacco use, who presented to the emergency department  with a 3- to 4-day history of shortness of breath, productive cough,  generalized malaise and a 2-day history of nausea, vomiting and  diarrhea.  The patient states that her cough has produced yellow-to-  green sputum.  She has had some pleurisy, primarily on the right.  She  presented to her primary care physician, Dr. Jeannetta Nap, several days ago,  and he prescribed doxycycline, Advair and Ultram.  Approximately 24  hours later, the patient developed nausea, vomiting and diarrhea.  She  states that over the past 24 hours, she has had at least 10 episodes of  vomiting and at least 8 or 9 episodes of diarrhea.  She denies coffee-  ground-type emesis and she denies black, tarry stools and bright red  blood per rectum.  She has had some lower abdominal pain she describes  as sharp and intermittent.  The pain sometimes radiates to her upper  abdomen.  She denies painful urination.  She denies any recent travel.  She denies recent sick contacts.  She did eat out at a fish restaurant  approximately one week ago.  Her husband did too, however, does not have  any of the same symptoms.  The patient has also had subjective fever and  chills.   During evaluation in the emergency  department, the patient was noted to  be afebrile and mildly tachycardic.  Her chest x-ray reveals bronchitis  with minimal patchy density in the right middle lobe.  Her lab data are  significant for a white blood cell count of 2.5, serum potassium of 2.9,  SGOT of 175, SGPT of 178, and a total bilirubin of 1.6.  The patient  will therefore be admitted for further evaluation and management.   PAST MEDICAL HISTORY:  1. COPD/asthma.  2. Ongoing tobacco use.  3. Type 2 diabetes mellitus.  4. History of a heart murmur.  5. Status post cholecystectomy in 1994.  6. Status post bilateral knee arthroscopic surgery.  7. History of alcohol abuse.  8. Freidrich's ataxia   MEDICATIONS:  1. Diabetes pill.  2. Advair 250 one inhalation b.i.d.  3. Doxycycline 100 mg b.i.d., prescribed 2 days ago.  4. Tramadol/acetaminophen 1 tablet every 6 hours as needed.   ALLERGIES:  No known drug allergies.   SOCIAL HISTORY:  The patient is married.  She has  2 children.  [place].  She is disabled.  She  smokes a half a pack of cigarettes per day.  She denies alcohol and  illicit drug use.   FAMILY HISTORY:  Her father died of emphysema at 50 years of age.  Her  mother is 102 years of age and has a seizure disorder.   PHYSICAL EXAMINATION:  VITAL SIGNS:  Temperature 98.1, blood pressure  115/72, pulse 90, respiratory rate 18, oxygen saturation 93% on 2 L of  nasal cannula oxygen.  GENERAL:  The patient is a small-framed 40 year old Caucasian woman who  is currently sitting up in bed in no acute distress.  HEENT:  Head is normocephalic, atraumatic.  Pupils are equal, round and  reactive to light.  Extraocular movements are intact.  Conjunctivae are  clear.  Sclerae are white.  Tympanic membranes are clear bilaterally.  Nasal mucosa is moist.  No sinus tenderness.  Oropharynx reveals poor  dentition.  Mucous membranes are mildly dry.  No posterior exudates  or  erythema.  NECK:  Supple.  No adenopathy.  No thyromegaly.  No bruit.  No JVD.  LUNGS:  Occasional crackles and wheezes bilaterally.  Breathing is  nonlabored.  HEART:  S1 and S2 with borderline tachycardia and a soft systolic  murmur.  ABDOMEN:  Mildly obese.  Hypoactive bowel sounds.  Soft.  Mildly tender  over the hypogastrium and less tenderness of the epigastrium.  No overt  right upper quadrant tenderness.  Negative Murphy's sign.  GENITOURINARY/RECTAL:  Deferred.  EXTREMITIES:  Chronic bilateral knee scars.  No knee effusion or  erythema bilaterally.  Pedal pulses palpable bilaterally.  No pretibial  edema and no pedal edema.  NEUROLOGIC:  The patient is alert and oriented x 2.  Cranial nerves II-  XII are intact.  Strength is 5/5 in the supine position.  Sensation is  intact.   ADMISSION LABORATORIES:  Chest x-ray results are above.  EKG reveals  normal sinus rhythm with a heart rate of 76 beats per minute, prolonged  Q-T interval, 514/578, nonspecific lateral T-wave abnormalities.  WBC  2.5, absolute neutrophil count 1.4.  Hemoglobin 16.3, platelets 78.  Sodium 133, potassium 2.9, chloride 82, CO2 39, glucose 166, BUN 1,  creatinine 0.54, lipase 16, calcium 9.2, total protein 7.4, albumin 4.0.  AST 175, ALT 178, total bilirubin 1.6.  Urinalysis:  Positive nitrite,  negative leukocyte esterase, 0 to 2 WBCs, 0 to 2 RBCs.   ASSESSMENT:  1. Shortness of breath with a productive cough.  The patient has      radiographic and clinical evidence of pneumonia.  She has an acute      bronchopneumonia given the appearance of the chest x-ray.  2. Nausea, vomiting and diarrhea.  It is unclear the exact etiology of      her GI symptoms.  Her symptoms seem to have started after several      doses of the doxycycline.  The patient has either nausea, vomiting      and diarrhea from the doxycycline or an acute gastroenteritis.  3. Elevated liver transaminases/hepatic transaminitis.  The  patient is      minimally tender per the abdominal exam.  She does have a history      of a cholecystectomy and remote alcohol abuse.  An ultrasound of      the abdomen, however, will be ordered for further evaluation.      Viral hepatitis  will also need to be assessed and ruled out.  4. Leukopenia/thrombocytopenia.  The patient's white blood cell count      and platelet count were within normal limits during the October      2008 hospitalization.  The leukopenia and thrombocytopenia may be      the consequence of an intolerance and/or side effect from      doxycycline versus the manifestation of infection.  Vitamin B12 and      folate deficiencies will need to be ruled out.  5. Hypokalemia.  The patient's serum potassium is 2.9.  More than      likely, the hypokalemia is secondary to vomiting and diarrhea.  6. Prolonged Q-T interval.  Magnesium deficiency will need to be ruled      out.  7. Type 2 diabetes mellitus.  The patient is treated with an oral      hypoglycemic agent; however, she cannot recall the name.  Her      venous glucose is moderately elevated.  8. Chronic obstructive pulmonary disease with ongoing tobacco abuse.   PLAN:  1. The patient will be admitted for further evaluation and management.  2. We will start antibiotic treatment with Rocephin and azithromycin.      Consider Flagyl empirically if the patient continues to have      diarrhea.  3. We will start Atrovent and Xopenex nebulizers as well as supportive      treatment, oxygen therapy and as-needed Robitussin.  4. Morphine p.r.n. for pain along with antiemetics.  5. We will add a nicotine patch.  6. Tobacco cessation counseling.  7. We will replete potassium chloride in the IV fluids and with      intravenous runs.  We will check a magnesium level to rule out      deficiency.  8. We will start a clear liquid diet only.  9. We will check an ultrasound of the abdomen, acute viral hepatitis      panel, and  stool studies.  10.We will check an EKG in the morning for reassessment.      Elliot Cousin, M.D.  Electronically Signed     DF/MEDQ  D:  06/12/2007  T:  06/12/2007  Job:  [id number]   cc:   Windle Guard, M.D.  Fax: [phone]

## 2010-09-14 NOTE — Discharge Summary (Signed)
[name], [name]            ACCOUNT NO.:  [id number]   MEDICAL RECORD NO.:  [id number]          PATIENT TYPE:  INP   LOCATION:  1526                         FACILITY:  [facility]   PHYSICIAN:  Beckey Rutter, MD  DATE OF BIRTH:  09/19/1970   DATE OF ADMISSION:  02/17/2007  DATE OF DISCHARGE:  02/19/2007                               DISCHARGE SUMMARY   PRIMARY CARE PHYSICIAN:  Windle Guard, M.D.   CHIEF COMPLAINT:  Shortness of breath.   HISTORY OF PRESENT ILLNESS:  The patient was admitted for COPD  exacerbation.   HOSPITAL COURSE:  Her COPD exacerbation was treated by IV steroids,  antibiotic, and IV fluids, together with oxygen and nebulizer  medication. The patient is improved. Today, she does not have wheezes or  lung findings on the examination. The patient is stable today to be  released to followup with her primary care physician. The patient has  picture of COPD but there is no lung findings of previous diagnosis of  chronic obstructive pulmonary disease. I will leave the further workup  to the discretion of Dr. Jeannetta Nap. The patient's condition was discussed  with her upon discharge. Other medical problems during hospitalization  was mainly stable. The patient was continued on her medication and she  will be discharged to continue on her medications.   DISCHARGE MEDICATIONS:  1. Avelox 400 mg p.o. daily for 5 more days.  2. Nicotine patch 3.  3. Prednisone tapering dose.   DISCHARGE DIAGNOSES:  1. Chronic obstructive pulmonary disease with exacerbation.  2. Tobacco abuse.  3. Diabetes.   DISPOSITION:  The patient was discharged to continue on her medication  and followup with her primary care physician, Dr. Jeannetta Nap, with a week.   During hospitalization, the patient was counseled to discontinue  smoking, especially now that she has started to come with chronic  obstructive pulmonary disease picture. The discussion of further workup  to ascertain the diagnosis  of COPD was left to Dr. Jeannetta Nap' discretion.      Beckey Rutter, MD  Electronically Signed     EME/MEDQ  D:  02/19/2007  T:  02/20/2007  Job:  [id number]   cc:   Windle Guard, M.D.  Fax: [phone]

## 2010-09-17 NOTE — Discharge Summary (Signed)
[name], [name]            ACCOUNT NO.:  [id number]   MEDICAL RECORD NO.:  [id number]          PATIENT TYPE:  INP   LOCATION:  1404                         [facility]AC[facility]   PHYSICIAN:  Elliot Cousin, M.D.    DATE OF BIRTH:  01/24/1971   DATE OF ADMISSION:  06/12/2007  DATE OF DISCHARGE:  06/14/2007                               DISCHARGE SUMMARY   DISCHARGE DIAGNOSES:  1. Pneumonia.  2. Chronic obstructive pulmonary disease with exacerbation with      ongoing tobacco abuse.  3. Nausea, vomiting, and diarrhea, possibly secondary to an acute      gastroenteritis.  4. Hepatic transaminitis secondary to fatty liver and alcohol use.  5. Leukopenia and thrombocytopenia, possibly secondary to chronic      alcohol use.  6. Hypokalemia.  7. Prolonged QT interval.  8. Type 2 diabetes mellitus.  9. History of Freidrich's ataxia   DISCHARGE MEDICATIONS:  1. Metformin 500 mg 1/2 tablet daily.  2. Zithromax 500 mg daily for five more days.  3. Ceftin 500 mg b.i.d. for five more days.  4. Multivitamin with iron once daily  5. Advair discus 1 puff b.i.d.   DISCHARGE DISPOSITION:  The patient was discharged home in improved and  stable condition on June 14, 2007.  She was advised to followup with  her primary care physician Dr. Windle Guard in 1 week.   CONSULTATIONS:  None.   PROCEDURE PERFORMED:  1. Ultrasound of the abdomen on June 13, 2007.  The results      revealed fatty infiltration of the liver.  No ductal dilatation.  A      trace amount of ascites.  2. Chest x-ray on June 12, 2007.  The results revealed bronchial      thickening consistent with bronchitis.  Minimal patchy density in      the right middle lobe or lingula seen on the lateral view      consistent with minimal pneumonia.   HISTORY OF PRESENT ILLNESS:  The patient is a 40 year old woman with a  past medical history significant for COPD, type 2 diabetes mellitus, and  Freidrich's ataxia.   She presented to the emergency department on  June 12, 2007, with a chief complaint of shortness of breath, cough,  nausea, vomiting, and diarrhea.  When the patient was evaluated in the  emergency department, she was noted to be mildly tachycardiac, but  afebrile.  Her white blood cell count was low at 2.5.  Her chest x-ray  revealed bronchitis, and a right middle lobe patchy density consistent  with pneumonia.  The patient was, therefore, admitted for further  evaluation and management.   For additional details please see the dictated history and physical.   HOSPITAL COURSE:  Problem #1:  PNEUMONIA/COPD/TOBACCO ABUSE.  The  patient was started on treatment with Rocephin and azithromycin.  Atrovent and Xopenex nebulizers were given as well as Mucinex and  Robitussin DM p.r.n.  A nicotine patch was placed, and the patient was  advised to stop smoking.  Over the course of the hospitalization, she  became less symptomatic.  Her  white blood cell count, which was 2.5 at  the time of the initial hospital assessment, improved to 5.4.  The  patient remained afebrile during her hospital course.  Once she was  symptomatically improved, she asked to be discharged.  Given that she  was afebrile and oxygenating in the mid 90s on room air, she was  discharged to home on June 14, 2007.  Again, the patient was advised  to stop smoking.  She was also advised to continue Advair as previously  ordered.  She was also advised to continue antibiotic treatment for an  additional 5 days.  The patient voiced understanding.   Problem #2:  NAUSEA, VOMITING AND DIARRHEA.  The patient was started on  symptomatic treatment with Phenergan or Zofran as needed.  Morphine was  given as needed for pain.  She was started on a clear liquid diet and  mild volume repletion with normal saline.  Stool specimens were ordered  for further evaluation.  The only stool specimen that was resulted  during hospitalization  was a negative fecal WBC.  After approximately 24  hours, the patient's nausea, vomiting, and diarrhea resolved.  Her diet  was advanced which she tolerated well.  The patient may have had a mild  gastroenteritis.   Problem #3:  HEPATIC TRANSAMINITIS.  At the time of the initial hospital  assessment, the patient's total bilirubin was 1.6; SGOT was 175; and  SGPT was 178.  Given her pneumonia, it was felt that the transaminitis  was possibly secondary to it.  The patient acknowledged a past medical  history of tobacco abuse; however, during the initial assessment, she  denied any alcohol use at all.  When further questioned, her husband  eventually disclosed that the patient had been drinking.  The patient  apparently drinks at least 3-4 beers daily.  She insisted that she did  not drink every day.; however, her husband gave her a look of unbelief.  However for further evaluation, an acute viral hepatitis panel was  ordered as well as an ultrasound of the abdomen.  The ultrasound of the  abdomen revealed fatty changes in the liver along with a small amount of  ascites.  The viral hepatitis panel was completely negative.  Over the  course of the hospitalization, the patient's transaminitis subsided.  Prior to hospital discharge, her total bilirubin was 1.3, SGOT was 73,  and SGPT was 103.  The patient was strongly advised to stop drinking  beer completely.   Problem #4:  LEUKOPENIA AND THROMBOCYTOPENIA.  As stated above, the  patient's white blood cell count was 2.5 at the time of the initial  hospital assessment.  Her platelet count was also low at 78,000.  For  further evaluation, an HIV screen as well as a vitamin B12, TSH, and  folate level were ordered.  The HIV was nonreactive (negative).  Her  vitamin B12 was within normal limits at 970.  Her folate level was 10,  and her TSH was within normal limits at 3.9.  The thrombocytopenia and  leukopenia may have been, or may be, secondary  to alcohol abuse with a  possible contribution from the acute pneumonia.  Of note, the patient's  white blood cell count normalized to 5.4; however, her platelet count  did decrease to 55.  There was no evidence of acute bleeding during the  hospital course.  Again, the patient was advised to stop drinking  alcohol totally.   Problem #5:  HYPOKALEMIA  AND A PROLONGED QT INTERVAL.  The patient's  serum potassium was 2.9 at the time of the initial hospital assessment.  Her EKG revealed a prolonged QT interval of 514.  A magnesium level was  ordered, and she was repleted with potassium chloride orally, and in the  IV fluids.  Her magnesium level was within normal limits at 2.2.  Following volume repletion, her serum potassium improved to 4.3.  A  follow-up EKG revealed some improvement in the QT interval; it was 482  prior to hospital discharge.   Problem #6:  TYPE 2 DIABETES MELLITUS.  The patient's venous glucose was  166 at the time of the initial hospital assessment.  She was treated  with sliding scale NovoLog during the hospital course.  Her hemoglobin  A1c was found to be 5.0.  She was discharged to home on metformin 1/2  tablet daily.      Elliot Cousin, M.D.  Electronically Signed     DF/MEDQ  D:  06/16/2007  T:  06/18/2007  Job:  [id number]   cc:   Windle Guard, M.D.  Fax: [phone]

## 2010-09-17 NOTE — Discharge Summary (Signed)
[name], [name]                      ACCOUNT NO.:  [id number]   MEDICAL RECORD NO.:  [id number]                   PATIENT TYPE:  INP   LOCATION:  5506                                 FACILITY:  [facility]   PHYSICIAN:  Leighton Roach McDiarmid, M.D.             DATE OF BIRTH:  1970-12-23   DATE OF ADMISSION:  06/07/2003  DATE OF DISCHARGE:  06/10/2003                                 DISCHARGE SUMMARY   DISCHARGE DIAGNOSES:  1. Peptic ulcer disease.  2. Acute gastroenteritis with dehydration, resolved.  3. Hepatitis B surface antigen positive.  4. Alcohol abuse.  5. Tobacco abuse.  6. Hypokalemia, resolved.   DISCHARGE MEDICATIONS:  1. Protonix 40 mg p.o. once a day for four to six weeks.  2. Multivitamins one capsule p.o. once a day.  3. Thiamine 100 p.o. once a day.  4. Phenergan 12.5 mg p.o. every six hours p.r.n. for nausea and vomiting.   DISCHARGE INSTRUCTIONS:  1. Diet - regular.  2. Follow-up with Dr. Lawerance Sabal on March 1, 3:00 p.m. at [facility].  3. Follow-up chronic hepatitis panel done February 8.   REVIEW OF HISTORY:  This is a 40 year old white female presenting with a two  day history of nausea and vomiting of nonbilious material associated with  abdominal pain and dizziness.  No fever, no diarrhea.   PHYSICAL EXAMINATION:  VITAL SIGNS:  Blood pressure 126/93, pulse rate 133,  becoming 102, respiratory rate of 20, temperature of 99, O2 saturation 98%  on room air, weight 80.8 pounds.  GENERAL:  Poorly nourished, disheveled in appearance, coherent, oriented  times three, not in distress.  HEENT:  Pink conjunctivae, anicteric sclerae, pupils equally round and  reactive to light, tympanic membranes to normal landmarks, dry oral mucosa,  poor dentition, no tonsillar or pharyngeal congestion.  NECK:  No thyromegaly.  CARDIOVASCULAR:  Adynamic pericardium.  Regular rate and rhythm.  Good S1  and S2.  No murmurs appreciated.  RESPIRATORY:  Clear  to auscultation bilaterally.  No rales, no wheezes, no  rhonchi.  ABDOMEN:  Active sounds.  Bowel sounds are normoactive.  No tenderness.  EXTREMITIES:  No cyanosis, dry skin.  NEUROLOGIC:  Cranial nerves intact.  Motor strength 5/5 in both upper and  lower extremities.  Reflexes +2, sensory intact.   LABORATORY DATA ON ADMISSION:  Hemoglobin 17.7, hematocrit 52.3, WBC 15.2,  platelets 179.  Sodium 131, potassium 4.4, chloride 98, BUN 15, glucose 189,  total protein 10.1, albumin 5.3, direct bilirubin 0.2, AST 39, ALT 27,  indirect bilirubin 2.9, alkaline phosphatase 109, total bilirubin 3.1,  magnesium 2.5, alcohol serum level less than 5 mg/dL, acetone moderate.  ABG; pH of 7.305, bicarbonate 15, pCO2 30.   Additional laboratories:  Ionized calcium 1.53, hepatitis A antibody  negative, hepatitis B surface antigen positive, hepatitis C antibody  negative. Sodium 137, potassium 3.1, chloride 108, carbon dioxide 24,  glucose  78, BUN less than 1, creatinine 0.4, calcium 7.8.  Total protein 5,  albumin 2.7, AST 23, ALT 16, alkaline phosphatase 62, total bilirubin 1.3.  Serum salicylate level less than 4.  Urine drug screen, none detected.  GC  and chlamydia probe, negative (urine).   COURSE IN THE HOSPITAL:  Problem 1.  Acute gastroenteritis with dehydration  - The patient was given IV fluids, Phenergan was given for nausea and  vomiting.  Workup was done to rule out the possibility of hepatitis,  pancreatitis.  After three days observation, the patient did not report any  episodes of nausea, vomiting or abdominal pain.  The patient's diet was  gradually expanded from n.p.o. and one day prior to discharge the patient  was tolerating a regular diet.   Problem 2.  Peptic ulcer disease - The patient was started on Protonix 40 mg  p.o. daily empiric therapy for PUD.   Problem 3.  Hepatitis B - HBS antigen was positive.  A chronic hepatitis  panel was ordered and was pending on the day of  discharge.   Problem 4.  Alcohol abuse -  The patient was started on Librium protocol for  possible withdrawal symptoms.  This was gradually tapered until the day of  discharge.   Problem 5.  Tobacco abuse - The option of smoking cessation was presented to  the patient.  However, at this point, she is not receptive to the idea.  I  will follow-up on this during her clinic visit with me.   CONDITION ON DISCHARGE:  The patient was discharged with no complaints, no  episodes of nausea, vomiting or abdominal pain.  Blood pressure 110 to  130/66 to 86, pulse rate 88 to 95, temperature 98.4.  Abdominal examination  showed normoactive bowel sounds with slight abdominal soreness in the right  upper quadrant area, but no direct tenderness, no rebound tenderness and no  guarding.  The patient was advised to follow-up with me at the [facility] and to continue her home medications of Protonix for at  least four to six weeks.      Lawerance Sabal, MD                         Etta Grandchild, M.D.    MC/MEDQ  D:  06/10/2003  T:  06/11/2003  Job:  [id number]   cc:   Lawerance Sabal, MD  Fax: [phone]

## 2010-09-17 NOTE — H&P (Signed)
NAME:  [name], [name]                      ACCOUNT NO.:  [id number]   MEDICAL RECORD NO.:  [id number]                   PATIENT TYPE:  INP   LOCATION:  5506                                 FACILITY:  [facility]   PHYSICIAN:  Leighton Roach McDiarmid, M.D.             DATE OF BIRTH:  15-Feb-1971   DATE OF ADMISSION:  06/07/2003  DATE OF DISCHARGE:                                HISTORY & PHYSICAL   ADDENDUM TO HISTORY AND PHYSICAL:   ADDITIONAL LABS ON ADMISSION:  Arterial blood gas:  pH of 7.305, bicarbonate  of 15, PCO2 of 30.   ASSESSMENT AND PLAN:  A 40 year old white female presenting with nausea,  vomiting and dehydration.  PROBLEM 1.  Gastrointestinal.   DIFFERENTIAL DIAGNOSES:  1. Acute gastroenteritis with dehydration, probably viral.  2. Rule out a bacterial etiology.  Will check stool for white blood cells     and stool guaiac and stool culture to rule out a bacterial cause for the     vomiting and dehydration.  3. Arterial blood gas results showed metabolic acidosis which is compatible     with dehydration.  4. Will check total creatinine and kinase to rule out rhabdomyolysis.  5. Will check serum salicylate as the etiology for the acidosis.  6. Will hydrate patient with normal saline fluid 500 cc/Hr. x4 cycles to be     followed by 250 cc/Hr.  7. Will recheck the BMET and ABG after hydration.   PROBLEM 2.  Alcohol abuse.  Presently no signs or symptoms of alcohol  withdrawal.  However because of a history of alcohol use we will start low-  dose Librium protocol.   PROBLEM 3.  History of Freidrich's ataxia.  The patient is being followed by  [facility].      Lawerance Sabal, MD                         Etta Grandchild, M.D.    MC/MEDQ  D:  06/07/2003  T:  06/09/2003  Job:  [id number]

## 2010-09-17 NOTE — H&P (Signed)
NAME:  [name], [name]                      ACCOUNT NO.:  [id number]   MEDICAL RECORD NO.:  [id number]                   PATIENT TYPE:  INP   LOCATION:  5506                                 FACILITY:  [facility]   PHYSICIAN:  Leighton Roach McDiarmid, M.D.             DATE OF BIRTH:  1970-06-04   DATE OF ADMISSION:  06/07/2003  DATE OF DISCHARGE:                                HISTORY & PHYSICAL   INCOMPLETE:   HISTORY OF PRESENT ILLNESS:  This is a 40 year old white female presenting  with a 2-day history of nausea and vomiting of non-bilious material.  This  was associated with abdominal pain and dizziness.  No fever, no diarrhea.   PAST HISTORY:  1. Arthritis.  2. Asthma.  3. Heart murmur.   MEDICATIONS:  None.   ALLERGIES:  No known drug allergies.   SURGICAL HISTORY:  1. Gallbladder surgery.  2. Arthroscopy.   FAMILY HISTORY:  Diabetes mellitus in the father and 2 children.  No history  of renal disease, hypertension, heart disease.   PERSONAL SOCIAL HISTORY:  The patient lives with her boyfriend.  She has 2  grown children, age 63 and 32, who live with her brother.  No smoking.  Drinks beer occasionally.  No drug use.   REVIEW OF SYSTEMS:  No fever, no diarrhea, no cough, no significant weight  loss.   PHYSICAL EXAMINATION:  VITAL SIGNS:  Blood pressure 126/93, pulse rate 133,  becoming 102, respiratory rate 20, temperature 99, O2 saturation 98% on room  air, weight 80.8 pounds.  GENERAL:  Poorly-nourished, disheveled in appearance, coherent, oriented x3,  not in distress.  HEENT:  Pink conjunctivae.  Anicteric sclerae.  Pupils equally round and  reactive to light.  Tympanic membranes with normal landmarks.  Dry oral  mucosa.  Poor dentition.  No tonsillopharyngeal congestion.  NECK:  No thyromegaly.  CVS:  __________ precordium.  Regular rate and rhythm.  Good S1 and S2.  No  murmurs appreciated.  RESPIRATORY:  Clear to auscultation bilaterally.  No rales, no wheezes,  no  rhonchi.  ABDOMEN:  Normoactive bowel sounds.  No masses, no tenderness.  EXTREMITIES:  Dry skin.  No cyanosis.  NEUROLOGIC:  Cranial nerves intact.  Motor strength 5/5 in both upper and  lower extremities.  Reflexes +2.  Sensory intact.   LABORATORY DATA:  On admission, hemoglobin 17.7, hematocrit 52.3, WBC 15.2,  platelets 179.  Sodium 131, potassium 4.4, chloride 98, BUN 15, glucose 189.  Total protein 10.1, albumin 5.3, bilirubin direct 0.2, AST 39, ALT 27,  indirect bilirubin 2.9.  Alkaline phosphatase 109, total bilirubin 3.1.  Magnesium 2.5.  Alcohol less than 5 mg/dl.  Acetone moderate.   Dictation to be continued.      Lawerance Sabal, MD                         Leighton Roach McDiarmid, M.D.  MC/MEDQ  D:  06/07/2003  T:  06/08/2003  Job:  [id number]

## 2010-09-23 ENCOUNTER — Encounter: Payer: Self-pay | Admitting: Pulmonary Disease

## 2010-10-04 ENCOUNTER — Ambulatory Visit (INDEPENDENT_AMBULATORY_CARE_PROVIDER_SITE_OTHER): Payer: Self-pay | Admitting: Pulmonary Disease

## 2010-10-04 ENCOUNTER — Encounter: Payer: Self-pay | Admitting: Pulmonary Disease

## 2010-10-04 VITALS — BP 122/84 | HR 80 | Temp 98.3°F | Ht 60.0 in | Wt 82.4 lb

## 2010-10-04 DIAGNOSIS — J449 Chronic obstructive pulmonary disease, unspecified: Secondary | ICD-10-CM

## 2010-10-04 MED ORDER — TIOTROPIUM BROMIDE MONOHYDRATE 18 MCG IN CAPS: 18.0000 ug | ORAL_CAPSULE | Freq: Every day | RESPIRATORY_TRACT | Status: DC

## 2010-10-04 NOTE — Progress Notes (Signed)
  Subjective:    Patient ID: [name], female    DOB: 02-21-1971, 40 y.o.   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  Alpha 1-antitrypsin was >301. Discharged on O2 . Has home health assistance.  PFTs 4/11  FEV1 0.84 (34%)  She has smoked since age 71. Father had severe COPD.   December 03, 2009  In with mother & friend - cat scratches over her face, c/o green phlegm, cough , dyspnea, received cefdinir in june. Continues to smoke 4 cigs/ d - they smoke outside.   April 05, 2010  Off advair , now on symbicort (red inhaler - confirmed) -, Breathing better, she reports quit in aug'11 but office staff saw her smoking  Worse in am, O2 helps but does not use every night    10/04/2010  She says she is not smoking however smells strong of smoke. Gets winded easily . NO increased cough or congestion. Last cxr 11/11- copd changes. Came to the office without oxygen. Hurt rt knee , now in a brace.  Denies chest pain, orthopnea, hemoptysis, fever, n/v/d, edema, headache.     Review of Systems Pt denies any significant  nasal congestion or excess secretions, fever, chills, sweats, unintended wt loss, pleuritic or exertional cp, orthopnea pnd or leg swelling.  Pt also denies any obvious fluctuation in symptoms with weather or environmental change or other alleviating or aggravating factors.    Pt denies any increase in rescue therapy over baseline, denies waking up needing it or having early am exacerbations or coughing/wheezing/ or dyspnea       Objective:   Physical Exam Gen. Pleasant, thin, frail, in wheelchair,in no distress ENT - no lesions, no post nasal drip Neck: No JVD, no thyromegaly, no carotid bruits Lungs: no use of accessory muscles, no dullness to percussion, clear without rales or rhonchi  Cardiovascular: Rhythm regular,  heart sounds  normal, no murmurs or gallops, no peripheral edema Musculoskeletal: No deformities, no cyanosis or clubbing          Assessment & Plan:

## 2010-10-04 NOTE — Patient Instructions (Signed)
Sample of spiriva- Rx sent Stay on symbicort twice daily Use your oxygen during sleep & walking Congratulations on quitting ! Stay away from cigarettes.

## 2010-10-05 NOTE — Assessment & Plan Note (Signed)
Severe gold stg 4 Emphasized smoking cessation & high risk of relapse. Ct symbicort, spiriva Rx sent Use O2 Would benefit from Pulm rehab - but she is not interested, esp with knee issue now.

## 2010-10-14 ENCOUNTER — Ambulatory Visit: Payer: Self-pay | Admitting: Pulmonary Disease

## 2011-01-05 ENCOUNTER — Ambulatory Visit: Payer: Self-pay | Admitting: Adult Health

## 2011-01-06 ENCOUNTER — Emergency Department (HOSPITAL_COMMUNITY): Admission: EM | Admit: 2011-01-06 | Payer: Self-pay | Source: Home / Self Care

## 2011-01-06 ENCOUNTER — Emergency Department (HOSPITAL_COMMUNITY)
Admission: EM | Admit: 2011-01-06 | Discharge: 2011-01-07 | Disposition: A | Discharge status: Home / Self Care | Payer: Medicaid Other | Attending: Emergency Medicine | Admitting: Emergency Medicine

## 2011-01-06 ENCOUNTER — Emergency Department (HOSPITAL_COMMUNITY): Payer: Medicaid Other

## 2011-01-06 ENCOUNTER — Emergency Department (HOSPITAL_COMMUNITY): Payer: Self-pay

## 2011-01-06 ENCOUNTER — Ambulatory Visit: Payer: Self-pay | Admitting: Adult Health

## 2011-01-06 DIAGNOSIS — R112 Nausea with vomiting, unspecified: Secondary | ICD-10-CM | POA: Insufficient documentation

## 2011-01-06 DIAGNOSIS — R197 Diarrhea, unspecified: Secondary | ICD-10-CM | POA: Insufficient documentation

## 2011-01-06 DIAGNOSIS — E119 Type 2 diabetes mellitus without complications: Secondary | ICD-10-CM | POA: Insufficient documentation

## 2011-01-06 DIAGNOSIS — J449 Chronic obstructive pulmonary disease, unspecified: Secondary | ICD-10-CM | POA: Insufficient documentation

## 2011-01-06 DIAGNOSIS — J4489 Other specified chronic obstructive pulmonary disease: Secondary | ICD-10-CM | POA: Insufficient documentation

## 2011-01-06 DIAGNOSIS — E860 Dehydration: Secondary | ICD-10-CM | POA: Insufficient documentation

## 2011-01-06 DIAGNOSIS — R109 Unspecified abdominal pain: Secondary | ICD-10-CM | POA: Insufficient documentation

## 2011-01-06 LAB — DIFFERENTIAL
Eosinophils Relative: 0 % (ref 0–5)
Lymphocytes Relative: 8 % — ABNORMAL LOW (ref 12–46)
Lymphs Abs: 0.9 10*3/uL (ref 0.7–4.0)
Neutro Abs: 9.9 10*3/uL — ABNORMAL HIGH (ref 1.7–7.7)

## 2011-01-06 LAB — CBC
HCT: 46.6 % — ABNORMAL HIGH (ref 36.0–46.0)
Hemoglobin: 16.3 g/dL — ABNORMAL HIGH (ref 12.0–15.0)
MCV: 97.1 fL (ref 78.0–100.0)
RBC: 4.8 MIL/uL (ref 3.87–5.11)
RDW: 12.9 % (ref 11.5–15.5)
WBC: 11.6 10*3/uL — ABNORMAL HIGH (ref 4.0–10.5)

## 2011-01-06 LAB — URINALYSIS, ROUTINE W REFLEX MICROSCOPIC
Glucose, UA: NEGATIVE mg/dL
Ketones, ur: >80 mg/dL — AB
Leukocytes, UA: NEGATIVE
Specific Gravity, Urine: 1.02 (ref 1.005–1.030)
pH: 5.5 (ref 5.0–8.0)

## 2011-01-07 LAB — COMPREHENSIVE METABOLIC PANEL
Alkaline Phosphatase: 109 U/L (ref 39–117)
BUN: 10 mg/dL (ref 6–23)
CO2: 23 mEq/L (ref 19–32)
Chloride: 84 mEq/L — ABNORMAL LOW (ref 96–112)
Creatinine, Ser: 0.55 mg/dL (ref 0.50–1.10)
GFR calc Af Amer: >60 mL/min (ref >60)
GFR calc non Af Amer: >60 mL/min (ref >60)
Glucose, Bld: 87 mg/dL (ref 70–99)
Potassium: 3.5 mEq/L (ref 3.5–5.1)
Total Bilirubin: 0.8 mg/dL (ref 0.3–1.2)

## 2011-01-07 LAB — LIPASE, BLOOD: Lipase: 11 U/L (ref 11–59)

## 2011-01-07 MED ORDER — IOHEXOL 300 MG/ML  SOLN
50.0000 mL | Freq: Once | INTRAMUSCULAR | Status: AC | PRN
  Administered 2011-01-07: 50 mL via INTRAVENOUS

## 2011-01-09 LAB — URINE CULTURE

## 2011-01-11 ENCOUNTER — Ambulatory Visit (INDEPENDENT_AMBULATORY_CARE_PROVIDER_SITE_OTHER): Payer: Self-pay | Admitting: Adult Health

## 2011-01-11 ENCOUNTER — Encounter: Payer: Self-pay | Admitting: Adult Health

## 2011-01-11 DIAGNOSIS — J449 Chronic obstructive pulmonary disease, unspecified: Secondary | ICD-10-CM

## 2011-01-11 MED ORDER — DOXYCYCLINE HYCLATE 100 MG PO TABS: 100.0000 mg | ORAL_TABLET | Freq: Two times a day (BID) | ORAL | Status: AC

## 2011-01-11 NOTE — Patient Instructions (Signed)
Doxycycline 100mg  Twice daily  For 7 days  Mucinex DM Twice daily  As needed  Cough/congestion  Fluids and rest  follow up Dr. Vassie Loll  In 3 months and As needed

## 2011-01-11 NOTE — Progress Notes (Signed)
  Subjective:    Patient ID: [name], female    DOB: [birth year], 40 y.o.   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  Alpha 1-antitrypsin was >301. Discharged on O2 . Has home health assistance.  PFTs 4/11  FEV1 0.84 (34%)  She has smoked since age 23. Father had severe COPD.   December 03, 2009  In with mother & friend - cat scratches over her face, c/o green phlegm, cough , dyspnea, received cefdinir in june. Continues to smoke 4 cigs/ d - they smoke outside.   April 05, 2010  Off advair , now on symbicort (red inhaler - confirmed) -, Breathing better, she reports quit in aug'11 but office staff saw her smoking  Worse in am, O2 helps but does not use every night    10/04/2010  She says she is not smoking however smells strong of smoke. Gets winded easily . NO increased cough or congestion. Last cxr 11/11- copd changes. Came to the office without oxygen. Hurt rt knee , now in a brace.  >>NO changes   01/11/2011 Follow up  Pt presents for follow up. Complains of cough with green phlem, wheezing, chest tightness for last 4 days . Pt went to ED 9/8  and was dx with a viral infection.  N/V has stopped. No fever or hemoptysis. OTC not helping.  Pt says she quit smoking completely.  Taking symbicort and spiriva without trouble.      Review of Systems Constitutional:   No  weight loss, night sweats,  Fevers, chills,  +fatigue, or  lassitude.  HEENT:   No headaches,  Difficulty swallowing,  Tooth/dental problems, or  Sore throat,                No sneezing, itching, ear ache, nasal congestion, post nasal drip,   CV:  No chest pain,  Orthopnea, PND, swelling in lower extremities, anasarca, dizziness, palpitations, syncope.   GI  No heartburn, indigestion, abdominal pain, nausea, vomiting, diarrhea, change in bowel habits, loss  of appetite, bloody stools.   Resp:  No coughing up of blood.  No chest wall deformity  Skin: no rash or lesions.  GU: no dysuria, change in color of urine, no urgency or frequency.  No flank pain, no hematuria   MS:  No joint pain or swelling.  No decreased range of motion.     Psych:  No change in mood or affect. No depression or anxiety.  No memory loss.           Objective:   Physical Exam  Gen. Pleasant, thin, frail, in no distress ENT - no lesions, no post nasal drip Neck: No JVD, no thyromegaly, no carotid bruits Lungs: no use of accessory muscles, no dullness to percussion, clear without rales or rhonchi  Cardiovascular: Rhythm regular, heart sounds  normal, no murmurs or gallops, no peripheral edema Musculoskeletal: No deformities, no cyanosis or clubbing , knee brace          Assessment & Plan:

## 2011-01-11 NOTE — Assessment & Plan Note (Addendum)
Mild flare  Congratulated on smoking cesstation  Continue on present regimen  Plan:  Doxycycline 100mg  Twice daily  For 7 days  Mucinex DM Twice daily  As needed  Cough/congestion  Fluids and rest  follow up Dr. Vassie Loll  In 3 months and As needed

## 2011-01-21 LAB — DIFFERENTIAL
Basophils Absolute: 0
Basophils Absolute: 0
Basophils Relative: 0
Basophils Relative: 1
Eosinophils Absolute: 0
Eosinophils Relative: 1
Lymphocytes Relative: 27
Lymphs Abs: 0.7
Monocytes Absolute: 0.4
Monocytes Relative: 15 — ABNORMAL HIGH
Myelocytes: 0
Neutro Abs: 1.4 — ABNORMAL LOW
Neutro Abs: 3.1
Neutrophils Relative %: 57
Neutrophils Relative %: 58
Promyelocytes Absolute: 0

## 2011-01-21 LAB — COMPREHENSIVE METABOLIC PANEL
ALT: 122 — ABNORMAL HIGH
ALT: 178 — ABNORMAL HIGH
AST: 175 — ABNORMAL HIGH
AST: 94 — ABNORMAL HIGH
Albumin: 3 — ABNORMAL LOW
Albumin: 4
Alkaline Phosphatase: 54
Alkaline Phosphatase: 67
Alkaline Phosphatase: 96
BUN: 1 — ABNORMAL LOW
CO2: 29
CO2: 31
Calcium: 9.2
Chloride: 100
Chloride: 94 — ABNORMAL LOW
GFR calc Af Amer: >60
GFR calc Af Amer: >60
GFR calc non Af Amer: >60
GFR calc non Af Amer: >60
Glucose, Bld: 166 — ABNORMAL HIGH
Glucose, Bld: 87
Glucose, Bld: 93
Potassium: 2.9 — ABNORMAL LOW
Potassium: 4.3
Sodium: 133 — ABNORMAL LOW
Sodium: 134 — ABNORMAL LOW
Total Bilirubin: 1.1
Total Bilirubin: 1.3 — ABNORMAL HIGH
Total Protein: 7.4

## 2011-01-21 LAB — CBC
HCT: 38.8
HCT: 46.4 — ABNORMAL HIGH
Hemoglobin: 13.4
Hemoglobin: 13.6
Hemoglobin: 16.3 — ABNORMAL HIGH
MCHC: 35.2
MCV: 97.9
MCV: 98.6
Platelets: 55 — ABNORMAL LOW
Platelets: 78 — ABNORMAL LOW
RBC: 3.91
RBC: 3.97
RBC: 4.74
RDW: 14.3
WBC: 2.5 — ABNORMAL LOW
WBC: 3.5 — ABNORMAL LOW
WBC: 5.4

## 2011-01-21 LAB — URINALYSIS, ROUTINE W REFLEX MICROSCOPIC
Glucose, UA: NEGATIVE
Hgb urine dipstick: NEGATIVE
Ketones, ur: >80 — AB
Leukocytes, UA: NEGATIVE
Nitrite: POSITIVE — AB
Protein, ur: 30 — AB
Specific Gravity, Urine: 1.025
Urobilinogen, UA: 1
pH: 8.5 — ABNORMAL HIGH

## 2011-01-21 LAB — VITAMIN B12: Vitamin B-12: 970 — ABNORMAL HIGH (ref 211–911)

## 2011-01-21 LAB — HEPATITIS PANEL, ACUTE
HCV Ab: NEGATIVE
Hep A IgM: NEGATIVE

## 2011-01-21 LAB — URINE MICROSCOPIC-ADD ON

## 2011-01-21 LAB — LIPASE, BLOOD: Lipase: 16

## 2011-01-21 LAB — COMPREHENSIVE METABOLIC PANEL WITH GFR
BUN: 1 — ABNORMAL LOW
CO2: 39 — ABNORMAL HIGH
Chloride: 82 — ABNORMAL LOW
Creatinine, Ser: 0.54
GFR calc non Af Amer: >60
Total Bilirubin: 1.6 — ABNORMAL HIGH

## 2011-01-21 LAB — HIV ANTIBODY (ROUTINE TESTING W REFLEX): HIV: NONREACTIVE

## 2011-01-21 LAB — HEMOGLOBIN A1C: Hgb A1c MFr Bld: 5

## 2011-01-21 LAB — SAVE SMEAR

## 2011-01-21 LAB — FOLATE: Folate: 10

## 2011-01-28 LAB — GLUCOSE, CAPILLARY

## 2011-02-02 LAB — CBC
HCT: 46.7 — ABNORMAL HIGH
Hemoglobin: 16.3 — ABNORMAL HIGH
MCHC: 34.8
MCV: 99.4
RDW: 14

## 2011-02-02 LAB — DIFFERENTIAL
Basophils Relative: 1
Lymphs Abs: 2.8
Monocytes Relative: 8
Neutro Abs: 1.6 — ABNORMAL LOW
Neutrophils Relative %: 32 — ABNORMAL LOW

## 2011-02-02 LAB — D-DIMER, QUANTITATIVE: D-Dimer, Quant: 0.45

## 2011-02-02 LAB — POCT CARDIAC MARKERS
CKMB, poc: <1 — ABNORMAL LOW
CKMB, poc: <1 — ABNORMAL LOW
Myoglobin, poc: 14.6
Myoglobin, poc: 28.9

## 2011-02-02 LAB — URINALYSIS, ROUTINE W REFLEX MICROSCOPIC
Bilirubin Urine: NEGATIVE
Glucose, UA: NEGATIVE
Ketones, ur: NEGATIVE
pH: 5.5

## 2011-02-02 LAB — COMPREHENSIVE METABOLIC PANEL
Alkaline Phosphatase: 74
BUN: 2 — ABNORMAL LOW
Calcium: 8.6
Glucose, Bld: 88
Total Protein: 7.6

## 2011-02-02 LAB — GLUCOSE, CAPILLARY

## 2011-02-09 LAB — CBC
HCT: 43.9
Hemoglobin: 15.5 — ABNORMAL HIGH
RBC: 4.46
RDW: 13

## 2011-02-09 LAB — CULTURE, BLOOD (ROUTINE X 2): Culture: NO GROWTH

## 2011-02-09 LAB — DIFFERENTIAL
Basophils Absolute: 0
Eosinophils Relative: 1
Lymphocytes Relative: 30
Lymphs Abs: 2
Monocytes Absolute: 1 — ABNORMAL HIGH
Monocytes Relative: 15 — ABNORMAL HIGH
Neutro Abs: 3.5

## 2011-02-09 LAB — BLOOD GAS, ARTERIAL
Acid-Base Excess: 2.3 — ABNORMAL HIGH
Bicarbonate: 28.6 — ABNORMAL HIGH
Drawn by: 283391
O2 Saturation: 99.9
TCO2: 26
pCO2 arterial: 54.8 — ABNORMAL HIGH
pO2, Arterial: 268 — ABNORMAL HIGH

## 2011-02-09 LAB — BASIC METABOLIC PANEL
CO2: 31
Calcium: 9.2
GFR calc Af Amer: >60
GFR calc non Af Amer: >60
Glucose, Bld: 100 — ABNORMAL HIGH
Potassium: 3.4 — ABNORMAL LOW
Sodium: 140

## 2011-02-16 LAB — WOUND CULTURE: Gram Stain: NONE SEEN

## 2011-03-05 ENCOUNTER — Emergency Department (HOSPITAL_COMMUNITY): Payer: Medicaid Other

## 2011-03-05 ENCOUNTER — Emergency Department (HOSPITAL_COMMUNITY)
Admission: EM | Admit: 2011-03-05 | Discharge: 2011-03-05 | Disposition: A | Discharge status: Home / Self Care | Payer: Medicaid Other | Attending: Emergency Medicine | Admitting: Emergency Medicine

## 2011-03-05 DIAGNOSIS — M25579 Pain in unspecified ankle and joints of unspecified foot: Secondary | ICD-10-CM | POA: Insufficient documentation

## 2011-03-05 DIAGNOSIS — J4489 Other specified chronic obstructive pulmonary disease: Secondary | ICD-10-CM | POA: Insufficient documentation

## 2011-03-05 DIAGNOSIS — IMO0002 Reserved for concepts with insufficient information to code with codable children: Secondary | ICD-10-CM | POA: Insufficient documentation

## 2011-03-05 DIAGNOSIS — E119 Type 2 diabetes mellitus without complications: Secondary | ICD-10-CM | POA: Insufficient documentation

## 2011-03-05 DIAGNOSIS — J449 Chronic obstructive pulmonary disease, unspecified: Secondary | ICD-10-CM | POA: Insufficient documentation

## 2011-03-05 DIAGNOSIS — Y92009 Unspecified place in unspecified non-institutional (private) residence as the place of occurrence of the external cause: Secondary | ICD-10-CM | POA: Insufficient documentation

## 2011-03-05 LAB — RAPID URINE DRUG SCREEN, HOSP PERFORMED
Amphetamines: NOT DETECTED
Opiates: NOT DETECTED

## 2011-04-28 ENCOUNTER — Encounter: Payer: Self-pay | Admitting: Pulmonary Disease

## 2011-04-28 ENCOUNTER — Ambulatory Visit (INDEPENDENT_AMBULATORY_CARE_PROVIDER_SITE_OTHER): Payer: No Typology Code available for payment source | Admitting: Pulmonary Disease

## 2011-04-28 VITALS — BP 122/62 | HR 82 | Temp 98.4°F | Ht 60.0 in | Wt 91.2 lb

## 2011-04-28 DIAGNOSIS — J4489 Other specified chronic obstructive pulmonary disease: Secondary | ICD-10-CM

## 2011-04-28 DIAGNOSIS — J449 Chronic obstructive pulmonary disease, unspecified: Secondary | ICD-10-CM

## 2011-04-28 DIAGNOSIS — Z23 Encounter for immunization: Secondary | ICD-10-CM

## 2011-04-28 MED ORDER — AZITHROMYCIN 500 MG PO TABS: 500.0000 mg | ORAL_TABLET | Freq: Every day | ORAL | Status: AC

## 2011-04-28 NOTE — Assessment & Plan Note (Signed)
Flu shot Treat as bronchitis with Z-pak, no need for prednisone Spiriva refills sent OK to take albuterol nebs 4 times /day if needed Call if breathing worse

## 2011-04-28 NOTE — Progress Notes (Deleted)
Patient ID: [name], female   DOB: [birth year], 40 y.o.   MRN: [id number]

## 2011-04-28 NOTE — Progress Notes (Signed)
  Subjective:    Patient ID: [name], female    DOB: 09/13/1970, 40 y.o.   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 Alpha 1-antitrypsin was >301. Discharged on O2 . Has home health assistance.  PFTs 4/11 FEV1 0.84 (34%) She has smoked since age 64. Father had severe COPD.    04/28/2011  FU  - received doxy sep '12  C/o breathing worse, green phlegm,  States quit x 2 yrs  Taking symbicort and spiriva without trouble - needing nebs more often Ataxia worse, fall 1 mnth ago    Review of Systems Patient denies significant  hemoptysis,  chest pain, palpitations, pedal edema, orthopnea, paroxysmal nocturnal dyspnea, lightheadedness, nausea, vomiting, abdominal or  leg pains      Objective:   Physical Exam  Gen. Pleasant, cachectic, in no distress ENT - no lesions, no post nasal drip Neck: No JVD, no thyromegaly, no carotid bruits Lungs: no use of accessory muscles, no dullness to percussion, clear without rales or rhonchi  Cardiovascular: Rhythm regular, heart sounds  normal, no murmurs or gallops, no peripheral edema Musculoskeletal: No deformities, no cyanosis or clubbing        Assessment & Plan:

## 2011-04-28 NOTE — Patient Instructions (Signed)
Flu shot Z-pak Spiriva refills sent OK t take albuterol nebs 4 times /day if needed Call if breathing worse

## 2011-06-13 ENCOUNTER — Ambulatory Visit (HOSPITAL_COMMUNITY)
Admission: RE | Admit: 2011-06-13 | Discharge: 2011-06-13 | Disposition: A | Discharge status: Home / Self Care | Payer: Medicaid Other | Source: Ambulatory Visit | Attending: Internal Medicine | Admitting: Internal Medicine

## 2011-06-13 ENCOUNTER — Other Ambulatory Visit (HOSPITAL_COMMUNITY): Payer: Self-pay | Admitting: Internal Medicine

## 2011-06-13 DIAGNOSIS — J209 Acute bronchitis, unspecified: Secondary | ICD-10-CM

## 2011-06-13 DIAGNOSIS — R059 Cough, unspecified: Secondary | ICD-10-CM | POA: Insufficient documentation

## 2011-06-13 DIAGNOSIS — R05 Cough: Secondary | ICD-10-CM | POA: Insufficient documentation

## 2011-06-13 DIAGNOSIS — J449 Chronic obstructive pulmonary disease, unspecified: Secondary | ICD-10-CM | POA: Insufficient documentation

## 2011-06-13 DIAGNOSIS — J4489 Other specified chronic obstructive pulmonary disease: Secondary | ICD-10-CM | POA: Insufficient documentation

## 2011-07-12 ENCOUNTER — Emergency Department (HOSPITAL_COMMUNITY): Payer: Medicaid Other

## 2011-07-12 ENCOUNTER — Emergency Department (HOSPITAL_COMMUNITY)
Admission: EM | Admit: 2011-07-12 | Discharge: 2011-07-12 | Disposition: A | Discharge status: Home / Self Care | Payer: Medicaid Other | Attending: Emergency Medicine | Admitting: Emergency Medicine

## 2011-07-12 ENCOUNTER — Encounter (HOSPITAL_COMMUNITY): Payer: Self-pay

## 2011-07-12 DIAGNOSIS — S8010XA Contusion of unspecified lower leg, initial encounter: Secondary | ICD-10-CM | POA: Insufficient documentation

## 2011-07-12 DIAGNOSIS — R062 Wheezing: Secondary | ICD-10-CM | POA: Insufficient documentation

## 2011-07-12 DIAGNOSIS — M79609 Pain in unspecified limb: Secondary | ICD-10-CM | POA: Insufficient documentation

## 2011-07-12 DIAGNOSIS — R0602 Shortness of breath: Secondary | ICD-10-CM | POA: Insufficient documentation

## 2011-07-12 DIAGNOSIS — W108XXA Fall (on) (from) other stairs and steps, initial encounter: Secondary | ICD-10-CM | POA: Insufficient documentation

## 2011-07-12 DIAGNOSIS — S8012XA Contusion of left lower leg, initial encounter: Secondary | ICD-10-CM

## 2011-07-12 DIAGNOSIS — J4489 Other specified chronic obstructive pulmonary disease: Secondary | ICD-10-CM | POA: Insufficient documentation

## 2011-07-12 DIAGNOSIS — Z9981 Dependence on supplemental oxygen: Secondary | ICD-10-CM | POA: Insufficient documentation

## 2011-07-12 DIAGNOSIS — R0989 Other specified symptoms and signs involving the circulatory and respiratory systems: Secondary | ICD-10-CM | POA: Insufficient documentation

## 2011-07-12 DIAGNOSIS — R0609 Other forms of dyspnea: Secondary | ICD-10-CM | POA: Insufficient documentation

## 2011-07-12 DIAGNOSIS — J449 Chronic obstructive pulmonary disease, unspecified: Secondary | ICD-10-CM

## 2011-07-12 LAB — GLUCOSE, CAPILLARY: Glucose-Capillary: 91 mg/dL (ref 70–99)

## 2011-07-12 LAB — BASIC METABOLIC PANEL
CO2: 35 mEq/L — ABNORMAL HIGH (ref 19–32)
Chloride: 85 mEq/L — ABNORMAL LOW (ref 96–112)
Creatinine, Ser: 0.32 mg/dL — ABNORMAL LOW (ref 0.50–1.10)
Glucose, Bld: 77 mg/dL (ref 70–99)
Sodium: 132 mEq/L — ABNORMAL LOW (ref 135–145)

## 2011-07-12 LAB — CBC
HCT: 50.8 % — ABNORMAL HIGH (ref 36.0–46.0)
MCV: 99.8 fL (ref 78.0–100.0)
RBC: 5.09 MIL/uL (ref 3.87–5.11)
RDW: 16.5 % — ABNORMAL HIGH (ref 11.5–15.5)
WBC: 5.2 10*3/uL (ref 4.0–10.5)

## 2011-07-12 LAB — PROTIME-INR
INR: 0.98 (ref 0.00–1.49)
Prothrombin Time: 13.2 seconds (ref 11.6–15.2)

## 2011-07-12 LAB — DIFFERENTIAL
Basophils Absolute: 0 10*3/uL (ref 0.0–0.1)
Lymphocytes Relative: 21 % (ref 12–46)
Lymphs Abs: 1.1 10*3/uL (ref 0.7–4.0)
Monocytes Absolute: 1 10*3/uL (ref 0.1–1.0)
Neutro Abs: 3.1 10*3/uL (ref 1.7–7.7)

## 2011-07-12 MED ORDER — PREDNISONE 20 MG PO TABS: 40.0000 mg | ORAL_TABLET | Freq: Every day | ORAL | Status: AC

## 2011-07-12 MED ORDER — IPRATROPIUM BROMIDE 0.02 % IN SOLN
0.5000 mg | Freq: Once | RESPIRATORY_TRACT | Status: AC
  Administered 2011-07-12: 0.5 mg via RESPIRATORY_TRACT
  Filled 2011-07-12: qty 2.5

## 2011-07-12 MED ORDER — ALBUTEROL (5 MG/ML) CONTINUOUS INHALATION SOLN
7.5000 mg/h | INHALATION_SOLUTION | Freq: Once | RESPIRATORY_TRACT | Status: AC
  Administered 2011-07-12: 7.5 mg/h via RESPIRATORY_TRACT

## 2011-07-12 MED ORDER — PREDNISONE 20 MG PO TABS
40.0000 mg | ORAL_TABLET | Freq: Once | ORAL | Status: AC
  Administered 2011-07-12: 40 mg via ORAL
  Filled 2011-07-12: qty 2

## 2011-07-12 MED ORDER — HYDROCODONE-ACETAMINOPHEN 5-325 MG PO TABS
2.0000 | ORAL_TABLET | Freq: Once | ORAL | Status: AC
  Administered 2011-07-12: 2 via ORAL
  Filled 2011-07-12: qty 2

## 2011-07-12 MED ORDER — ALBUTEROL SULFATE (5 MG/ML) 0.5% IN NEBU
5.0000 mg | INHALATION_SOLUTION | Freq: Once | RESPIRATORY_TRACT | Status: AC
  Administered 2011-07-12: 5 mg via RESPIRATORY_TRACT
  Filled 2011-07-12: qty 1

## 2011-07-12 MED ORDER — IBUPROFEN 400 MG PO TABS: 400.0000 mg | ORAL_TABLET | Freq: Three times a day (TID) | ORAL | Status: AC | PRN

## 2011-07-12 MED ORDER — SODIUM CHLORIDE 0.9 % IV SOLN: INTRAVENOUS | Status: DC

## 2011-07-12 MED ORDER — SODIUM CHLORIDE 0.9 % IV BOLUS (SEPSIS)
500.0000 mL | Freq: Once | INTRAVENOUS | Status: AC
  Administered 2011-07-12: 500 mL via INTRAVENOUS

## 2011-07-12 MED ORDER — ALBUTEROL SULFATE (5 MG/ML) 0.5% IN NEBU
INHALATION_SOLUTION | RESPIRATORY_TRACT | Status: AC
  Filled 2011-07-12: qty 1.5

## 2011-07-12 NOTE — ED Notes (Signed)
Report given to Faith RN

## 2011-07-12 NOTE — ED Notes (Signed)
Pt presents with no acute distress.  Fell on Sunday.  Pt states her sugas was low

## 2011-07-12 NOTE — ED Notes (Signed)
Pt taken to acute room 9- placed on c monitor and pulse ox NRB applied for support

## 2011-07-12 NOTE — ED Notes (Signed)
Pt getting contiuous neb at present. Pt alert and oriented x4. Respirations even and unlabored, bilateral symmetrical rise and fall of chest. Skin warm and dry. In no acute distress. Denies needs.

## 2011-07-12 NOTE — ED Notes (Signed)
[facility] notified of need for transport. 

## 2011-07-12 NOTE — ED Notes (Signed)
Pt to transport pt

## 2011-07-12 NOTE — ED Notes (Signed)
Pt being moved to hallway bed. Pt does not have oxygen [place] home on, so [facility] will be called.

## 2011-07-12 NOTE — ED Notes (Signed)
Pt took off her O2 and her oxygen status dropped. Immediately rn put O2 back on at 2 L. Stats went back to 93%. At present Pt alert and oriented x4. Respirations even and unlabored, bilateral symmetrical rise and fall of chest. Skin warm and dry. In no acute distress. Denies needs. Pt does have slurring speech, family reports this is her normal baseline after she gets breathing treatments. Pt can answer questions appropriately.

## 2011-07-12 NOTE — ED Provider Notes (Signed)
History     CSN: [id number]  Arrival date & time 07/12/11  1227   First MD Initiated Contact with Patient 07/12/11 1313      Chief Complaint  Patient presents with  . Leg Pain    after falling on sunday.    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 The patient walks with a walker and states the walker slipped she fell to the ground hitting her left leg and knee on the ground with subsequent pain and tenderness. She has been able to ambulate subsequent to this fall, but reports persistent pain. Incidentally, the patient is found to be wheezing and in mild respiratory distress on evaluation in the ED. She states that she had arrived without her supplemental oxygen which she normally wears around the clock. Initially she was hypoxic with oxygen saturation normalizing on 2-3 L by nasal cannula. Treatment with oral steroid and nebulized bronchodilators significantly improved her dyspnea and wheezing. She denies fever, chills, productive cough, chest pain, or other injury from her fall.  Patient is a 41 y.o. female presenting with leg pain. The history is provided by the patient.  Leg Pain  The incident occurred 2 days ago. The incident occurred at home. The injury mechanism was a fall. The pain is present in the left leg and left knee. The quality of the pain is described as aching (tender). The pain is moderate. The pain has been constant since onset. Pertinent negatives include no numbness, no inability to bear weight, no loss of motion, no muscle weakness, no loss of sensation and no tingling. She reports no foreign bodies present. The symptoms are aggravated by bearing weight (palpation). She has tried immobilization (The patient's left lower  extremity is currently in a knee immobilizer that she states she has been using since she had a fall and injury in November of last year.) for the symptoms. The treatment provided mild relief.    Past Medical History  Diagnosis Date  . Arthritis   . Diabetes mellitus   . COPD (chronic obstructive pulmonary disease)   . Respiratory failure, acute     Past Surgical History  Procedure Date  . Cholecystectomy   . Knee arthroscopy     bilateral    Family History  Problem Relation Age of Onset  . Diabetes    . Asthma      History  Substance Use Topics  . Smoking status: Former Smoker -- 0.5 packs/day for 10 years    Quit date: 01/30/2010  . Smokeless tobacco: Never Used  . Alcohol Use: No    OB History    Grav Para Term Preterm Abortions TAB SAB Ect Mult Living                  Review of Systems  Constitutional: Negative for fever, chills and fatigue.  HENT: Negative for nosebleeds, rhinorrhea, neck pain, neck stiffness, postnasal drip and tinnitus.   Eyes: Negative.   Respiratory: Positive for shortness of breath and wheezing. Negative for cough, chest tightness and stridor.   Cardiovascular: Negative.  Negative for chest pain and palpitations.  Gastrointestinal: Negative.  Negative for nausea, vomiting and abdominal pain.  Musculoskeletal: Positive for arthralgias. Negative for back pain and joint swelling.  Skin: Negative for rash and wound.  Neurological: Negative for tingling,  syncope, numbness and headaches.  Psychiatric/Behavioral: Negative for confusion.    Allergies  Review of patient's allergies indicates no known allergies.  Home Medications   Current Outpatient Rx  Name Route Sig Dispense Refill  . ALBUTEROL SULFATE (2.5 MG/3ML) 0.083% IN NEBU Nebulization Take 2.5 mg by nebulization every 4 (four) hours as needed.      . ALBUTEROL SULFATE HFA 108 (90 BASE) MCG/ACT IN AERS Inhalation Inhale 2 puffs into the lungs every 4 (four) hours as needed.      .  BUDESONIDE-FORMOTEROL FUMARATE 160-4.5 MCG/ACT IN AERO Inhalation Inhale 2 puffs into the lungs 2 (two) times daily.      . OXYGEN-HELIUM IN Inhalation Inhale into the lungs.    Marland Kitchen TIOTROPIUM BROMIDE MONOHYDRATE 18 MCG IN CAPS Inhalation Place 1 capsule (18 mcg total) into inhaler and inhale daily. 30 capsule 2    BP 100/60  Pulse 89  Resp 14  SpO2 97%  LMP 06/06/2011  Physical Exam  Nursing note and vitals reviewed. Constitutional: She is oriented to person, place, and time. She appears well-nourished. No distress.       Microsomia  HENT:  Head: Normocephalic and atraumatic.  Right Ear: External ear normal.  Left Ear: External ear normal.  Nose: Nose normal.  Mouth/Throat: Oropharynx is clear and moist.  Eyes: Conjunctivae and EOM are normal. Pupils are equal, round, and reactive to light. Right eye exhibits no discharge. Left eye exhibits no discharge.  Neck: Normal range of motion. Neck supple. No JVD present. No tracheal deviation present.  Cardiovascular: Normal rate, regular rhythm, normal heart sounds and intact distal pulses.  Exam reveals no gallop and no friction rub.   No murmur heard. Pulmonary/Chest: No accessory muscle usage or stridor. Not tachypneic. She is in respiratory distress. She has no decreased breath sounds. She has wheezes in the right upper field, the right middle field, the right lower field, the left upper field, the left middle field and the left lower field. She has no rhonchi. She has no rales. She exhibits no tenderness.  Abdominal: Soft. Bowel sounds are normal. She exhibits no distension. There is no tenderness. There is no rebound and no guarding.  Musculoskeletal: Normal range of motion. She exhibits tenderness. She exhibits no edema.       Left knee: She exhibits bony tenderness. She exhibits normal range of motion, no swelling, no effusion, no ecchymosis, no deformity, no erythema, normal alignment, no LCL laxity, normal patellar mobility and no MCL  laxity. tenderness found. Lateral joint line tenderness noted. No medial joint line and no patellar tendon tenderness noted.       Left lower leg: She exhibits tenderness and bony tenderness. She exhibits no swelling, no edema, no deformity and no laceration.       Legs: Lymphadenopathy:    She has no cervical adenopathy.  Neurological: She is alert and oriented to person, place, and time. She has normal reflexes. No cranial nerve deficit. She exhibits normal muscle tone. Coordination normal.  Skin: Skin is warm and dry. No rash noted. She is not diaphoretic. No erythema. No pallor.  Psychiatric: She has a normal mood and affect. Her behavior is normal. Judgment and thought content normal.    ED Course  Procedures (including critical care time)  Labs Reviewed  CBC - Abnormal; Notable for the following:    Hemoglobin 17.9 (*)    HCT 50.8 (*)    MCH 35.2 (*)    RDW 16.5 (*)  Platelets 71 (*)    All other components within normal limits  DIFFERENTIAL - Abnormal; Notable for the following:    Monocytes Relative 19 (*)    All other components within normal limits  BASIC METABOLIC PANEL - Abnormal; Notable for the following:    Sodium 132 (*)    Chloride 85 (*)    CO2 35 (*)    BUN 3 (*)    Creatinine, Ser 0.32 (*)    All other components within normal limits  GLUCOSE, CAPILLARY  PROTIME-INR  APTT   Dg Chest 2 View  07/12/2011  *RADIOLOGY REPORT*  Clinical Data: Cough.  Hypoxia.  Wheezing.  CHEST - 2 VIEW  Comparison: 06/13/2011  Findings: Cardiomediastinal silhouette is within normal limits. The lungs are free of focal consolidations and pleural effusions. No edema.  Surgical clips are identified in the upper abdomen.  IMPRESSION: No evidence for acute  abnormality.  Original Report Authenticated By: Patterson Hammersmith, M.D.   Dg Knee 2 Views Left  07/12/2011  *RADIOLOGY REPORT*  Clinical Data: Knee pain.  No known injury.  LEFT KNEE - 1-2 VIEW  Comparison: 03/05/2011  Findings:  There is no evidence for acute fracture or dislocation. No soft tissue foreign body or gas identified.  IMPRESSION: Negative exam.  Original Report Authenticated By: Patterson Hammersmith, M.D.   Dg Tibia/fibula Left  07/12/2011  *RADIOLOGY REPORT*  Clinical Data: Left knee and lower leg pain.  No known injury.  LEFT TIBIA AND FIBULA - 2 VIEW  Comparison: 03/05/2011  Findings: There is no evidence for acute fracture or dislocation. No soft tissue foreign body or gas identified.  IMPRESSION: Negative exam.  Original Report Authenticated By: Patterson Hammersmith, M.D.     No diagnosis found.    MDM  Contusion of the lower leg, fracture, sprain, dislocation are all considered as potential etiologies of the patient's symptoms of leg pain. There is no suggestion of DVT on physical examination, and pain in her leg is subsequent to a fall. Otherwise, the patient has apparent COPD exacerbation, and I will get a chest x-ray to exclude acute infiltrate or pneumonia. I will treat her with oral corticosteroids, nebulized bronchodilator treatments and reevaluate her subsequently.       4:32 PM At this time, the patient is in no distress, breathing easily and with decreased wheezing in all lung fields, normal oxygen saturation on supplemental oxygen (patient is on supplemental oxygen at home), and is awake, alert, and oriented at her baseline per her caregivers who are at bedside. Her blood work is within normal limits, and her x-rays show no acute infiltrate or pneumonia, and her lower extremity x-rays show no acute fracture or dislocation. The patient has an apparent contusion to the left lower leg. I will discharge her home with analgesic to use as needed and to followup with her primary care physician later this week if symptoms persist without improvement. The patient and her caregivers state their understanding of and agreement with the plan of care.  Felisa Bonier, MD 07/12/11 [phone]

## 2011-07-12 NOTE — ED Notes (Signed)
Pt alert and oriented x4. Respirations even and unlabored, bilateral symmetrical rise and fall of chest. Skin warm and dry. In no acute distress. Denies needs.   

## 2011-07-27 ENCOUNTER — Ambulatory Visit: Payer: No Typology Code available for payment source | Admitting: Adult Health

## 2011-08-02 ENCOUNTER — Encounter: Payer: Self-pay | Admitting: Adult Health

## 2011-08-02 ENCOUNTER — Ambulatory Visit (INDEPENDENT_AMBULATORY_CARE_PROVIDER_SITE_OTHER): Payer: Medicaid Other | Admitting: Adult Health

## 2011-08-02 VITALS — BP 116/74 | HR 76 | Temp 97.0°F | Ht 60.0 in | Wt 92.8 lb

## 2011-08-02 DIAGNOSIS — J449 Chronic obstructive pulmonary disease, unspecified: Secondary | ICD-10-CM

## 2011-08-02 NOTE — Progress Notes (Signed)
  Subjective:    Patient ID: [name], female    DOB: March 25, 1971, 41 y.o.   MRN: [id number]  HPI  41/F, smoker with friedrich's ataxia for FU of COPD  Admitted 07/19/2009- 07/28/2009 w/ COPD exacerbation noted to have hypercarbic RF on admission required intubation. Alpha 1-antitrypsin was >301. Discharged on O2 . Has home health assistance.  PFTs 4/11 FEV1 0.84 (34%) She has smoked since age 52. Father had severe COPD.    04/28/11   FU  - received doxy sep '12 C/o breathing worse, green phlegm,  States quit x 2 yrs  Taking symbicort and spiriva without trouble - needing nebs more often Ataxia worse, fall 1 mnth ago >>zpack   08/02/2011 Follow up  Returns for follow up . Says she is doing well with her breathing . NO flare since last ov.  Taking her symbicort and spiriva without missed doses. No flare in cough/dyspnea.  No leg swelling.      Review of Systems Constitutional:   No  weight loss, night sweats,  Fevers, chills,  +fatigue, or  lassitude.  HEENT:   No headaches,  Difficulty swallowing,  Tooth/dental problems, or  Sore throat,                No sneezing, itching, ear ache, + nasal congestion, post nasal drip,   CV:  No chest pain,  Orthopnea, PND, swelling in lower extremities, anasarca, dizziness, palpitations, syncope.   GI  No heartburn, indigestion, abdominal pain, nausea, vomiting, diarrhea, change in bowel habits, loss of appetite, bloody stools.   Resp:   No coughing up of blood.   No chest wall deformity  Skin: no rash or lesions.  GU: no dysuria, change in color of urine, no urgency or frequency.  No flank pain, no hematuria   MS:  No joint pain or swelling.  No decreased range of motion.  No back pain.  Psych:  No change in mood or affect. No depression or anxiety.  No memory loss.           Objective:   Physical Exam   Gen. Pleasant, cachectic, in no distress, in wheelchair  ENT - no lesions, no post nasal drip Neck: No  JVD, no thyromegaly, no carotid bruits Lungs: no use of accessory muscles, no dullness to percussion, clear without rales or rhonchi  Cardiovascular: Rhythm regular, heart sounds  normal, no murmurs or gallops, no peripheral edema Musculoskeletal: No deformities, no cyanosis or clubbing        Assessment & Plan:

## 2011-08-02 NOTE — Patient Instructions (Signed)
Continue on spiriva daily  Stay on symbicort twice daily Use your oxygen during sleep & walking follow up Dr. Vassie Loll  In 3 months and As needed

## 2011-08-02 NOTE — Assessment & Plan Note (Signed)
Compensated on present regimen Cont on same rx  Xray last month no acute process Immunizations utd

## 2011-11-06 IMAGING — CT CT ABD-PELV W/ CM
1 of 2 series · 15 of 32 positions shown, 19 images · IV contrast (APPLIED)
Comparison: CT pelvis 04/29/2004

CLINICAL DATA: Mid abdominal pain for 2 days.

CT ABDOMEN AND PELVIS WITH CONTRAST
TECHNIQUE: Multidetector CT imaging of the abdomen and pelvis was
performed following the standard protocol during bolus
administration of intravenous contrast.
Contrast: 50mL OMNIPAQUE IOHEXOL 300 MG/ML IV SOLN.

[Series 2: abd/pel with · axial · 0.63mm/px · z∈[+1010,+1366]mm · 15 of 79 slices shown, 19 images]
[im 4/79  soft-tissue]
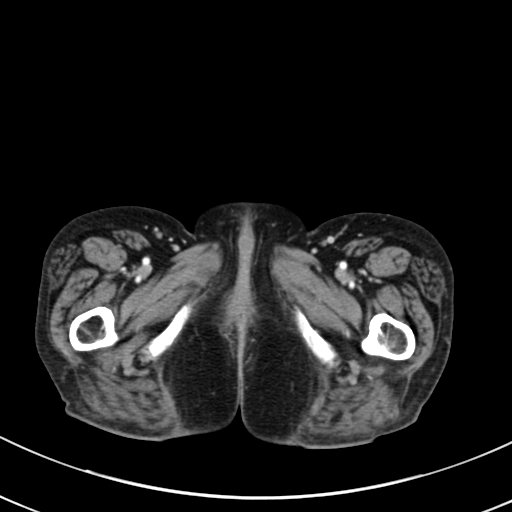
[im 4/79  bone]
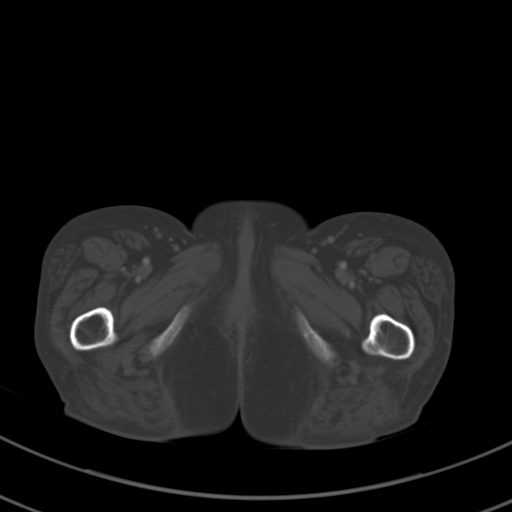
[im 11/79  soft-tissue]
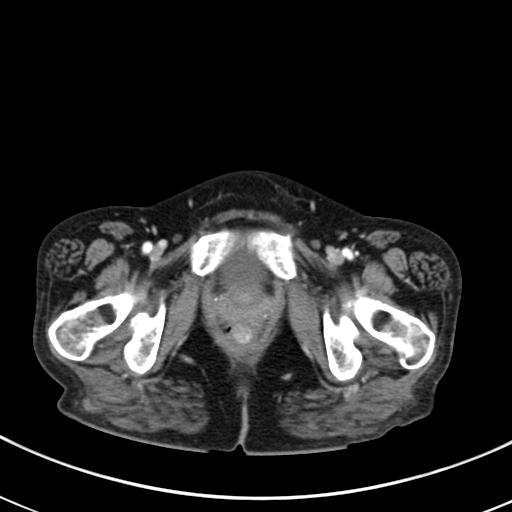
[im 17/79  soft-tissue]
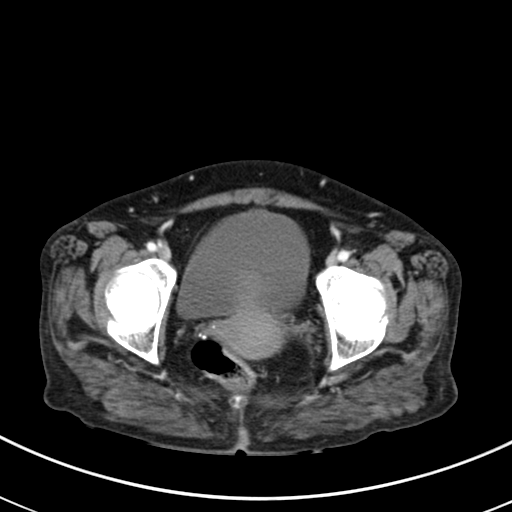
[im 21/79  soft-tissue]
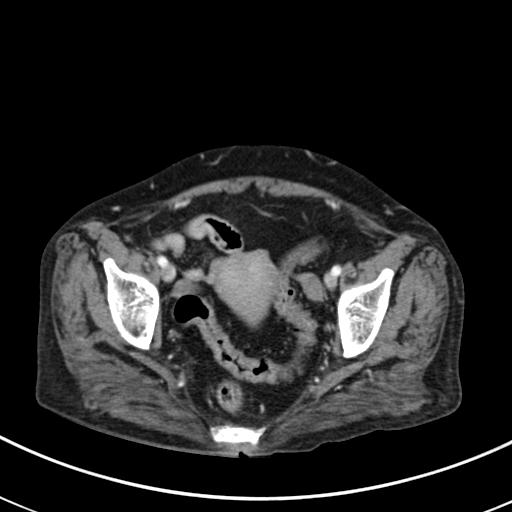
[im 28/79  soft-tissue]
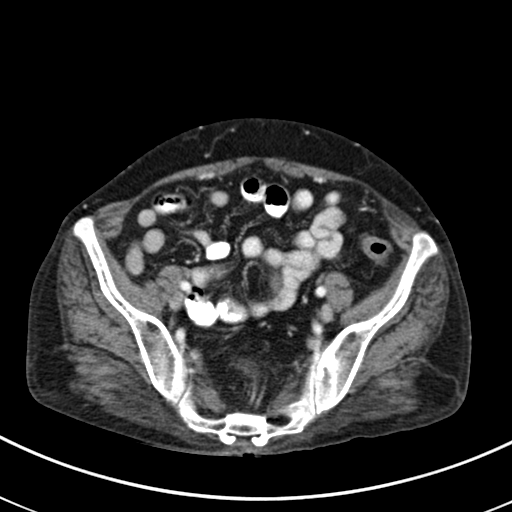
[im 34/79  soft-tissue]
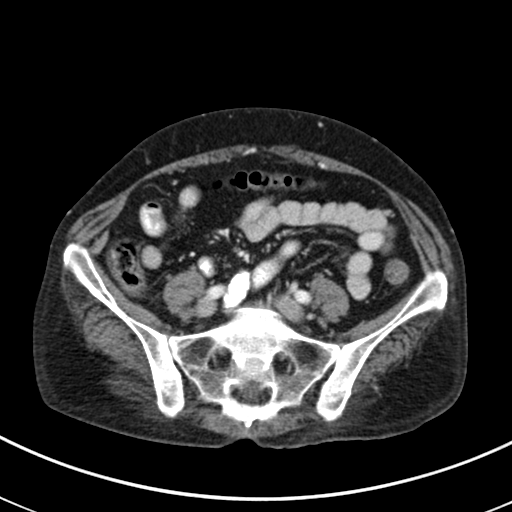
[im 41/79  soft-tissue]
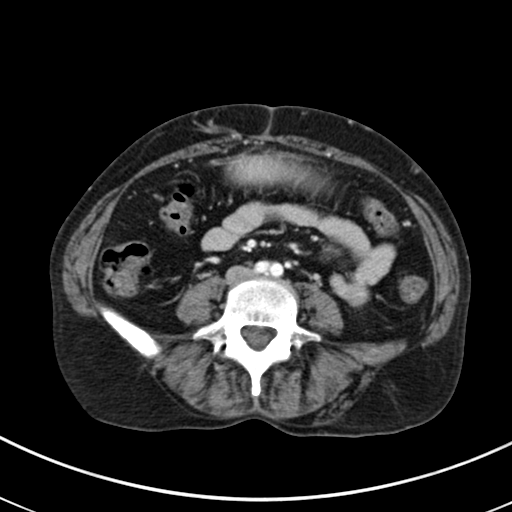
[im 45/79  soft-tissue]
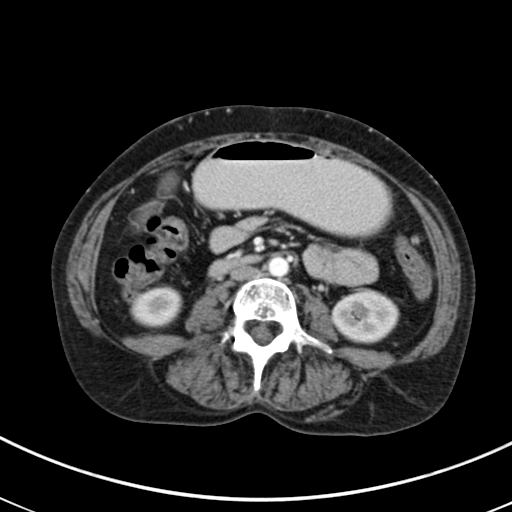
[im 51/79  soft-tissue]
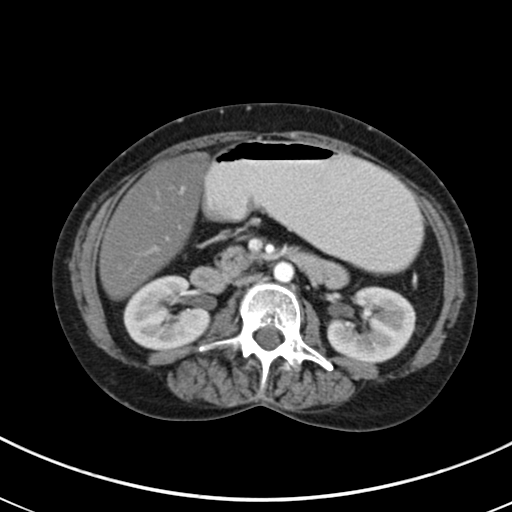
[im 51/79  bone]
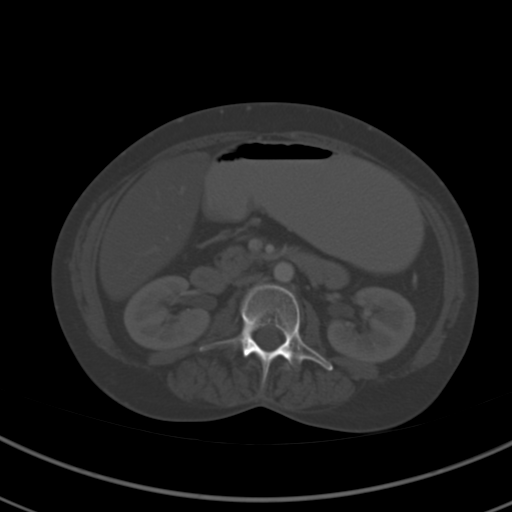
[im 58/79  soft-tissue]
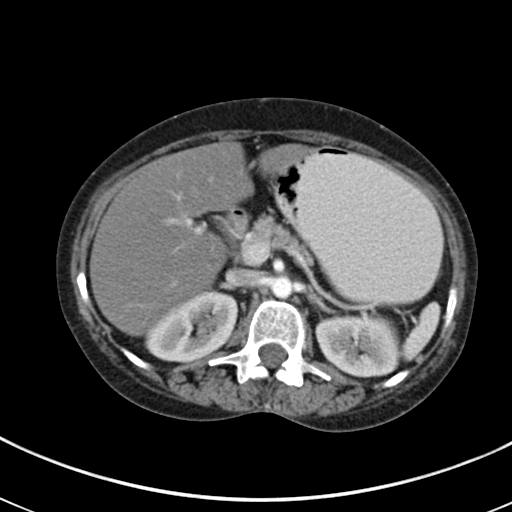
[im 62/79  soft-tissue]
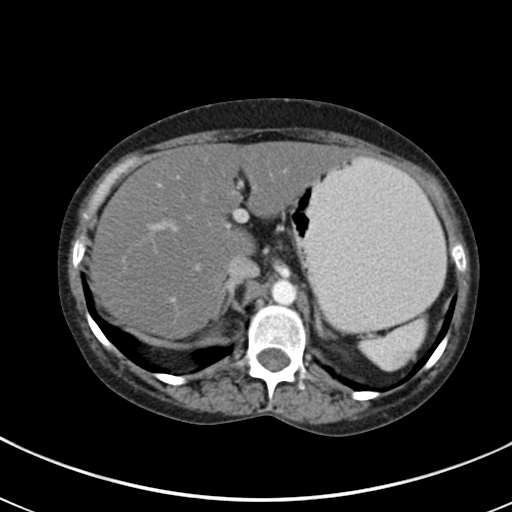
[im 65/79  lung]
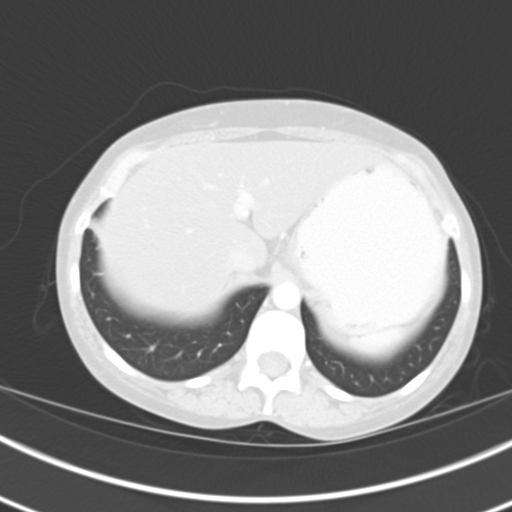
[im 68/79  soft-tissue]
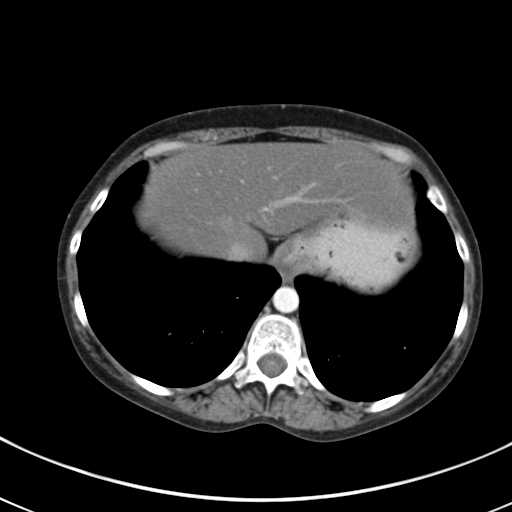
[im 68/79  lung]
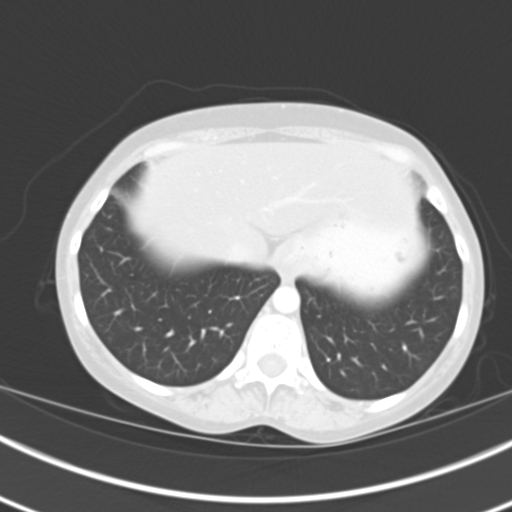
[im 72/79  lung]
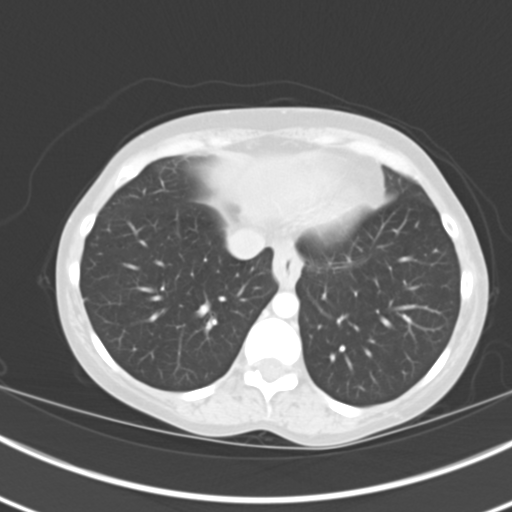
[im 75/79  soft-tissue]
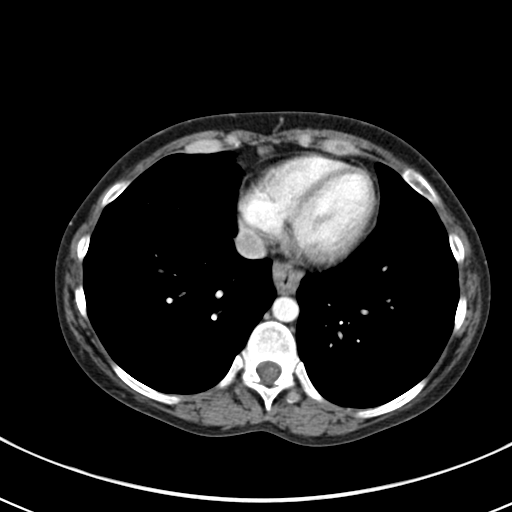
[im 75/79  lung]
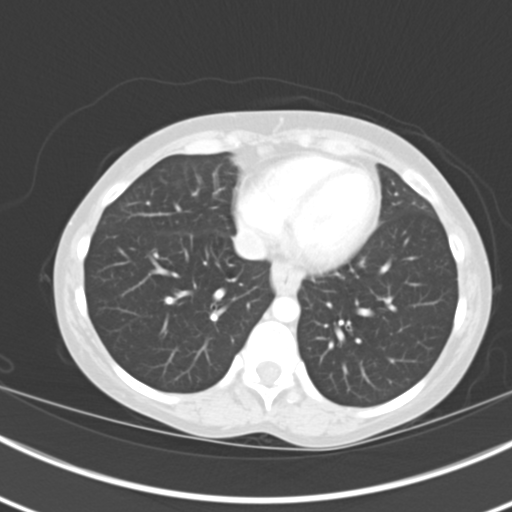

[15 of 32 positions shown; findings below may reference images not displayed]

FINDINGS: The lung bases are clear.  The distal esophageal wall
looks thickened.  This is likely due to reflux disease.
Circumferential mass is not entirely excluded.

Diffuse low attenuation change throughout the liver consistent with
fatty infiltration.  No focal lesions demonstrated.  The spleen
size is normal.  Surgical absence of the gallbladder.  No bile duct
dilatation.  The pancreas and adrenal glands are unremarkable.  The
stomach is moderately distended with contrast material but no wall
thickening is demonstrated.  No small bowel dilatation.  Kidneys
appear symmetrical without mass or hydronephrosis.  Normal
abdominal aorta.  No abnormal retroperitoneal lymphadenopathy.
Flattening of the inferior vena cava which might represent
dehydration.  No free air or free fluid in the abdomen.

Pelvis:  No free or loculated pelvic fluid collections.  The
bladder wall is not thickened.  Uterus and adnexal structures are
not enlarged.  The colon is decompressed and difficult to evaluate
for wall thickness but no obvious inflammatory changes are
demonstrated.  The appendix is normal.

Normal alignment of the lumbar vertebrae.  Old healed fracture
deformity of the right iliac crest.  Degenerative changes in the
lumbar facet joints.
IMPRESSION: Probable distal esophagitis.  Diffuse fatty infiltration of the
liver.  Flattening of the inferior vena cava suggesting
dehydration.  No acute inflammatory process demonstrated in the
abdomen or pelvis.

## 2011-11-14 ENCOUNTER — Ambulatory Visit: Payer: Medicaid Other | Admitting: Pulmonary Disease

## 2011-11-24 ENCOUNTER — Ambulatory Visit: Payer: Medicaid Other | Admitting: Pulmonary Disease

## 2011-12-01 ENCOUNTER — Emergency Department (HOSPITAL_COMMUNITY)
Admission: EM | Admit: 2011-12-01 | Discharge: 2011-12-01 | Disposition: A | Discharge status: Home / Self Care | Payer: Medicaid Other | Source: Home / Self Care | Attending: Emergency Medicine | Admitting: Emergency Medicine

## 2011-12-01 ENCOUNTER — Emergency Department (INDEPENDENT_AMBULATORY_CARE_PROVIDER_SITE_OTHER): Payer: Medicaid Other

## 2011-12-01 ENCOUNTER — Encounter (HOSPITAL_COMMUNITY): Payer: Self-pay

## 2011-12-01 DIAGNOSIS — M25569 Pain in unspecified knee: Secondary | ICD-10-CM

## 2011-12-01 MED ORDER — HYDROCODONE-ACETAMINOPHEN 5-325 MG PO TABS
1.0000 | ORAL_TABLET | Freq: Once | ORAL | Status: AC
  Administered 2011-12-01: 1 via ORAL

## 2011-12-01 MED ORDER — HYDROCODONE-ACETAMINOPHEN 5-500 MG PO TABS: 1.0000 | ORAL_TABLET | Freq: Four times a day (QID) | ORAL | Status: AC | PRN

## 2011-12-01 MED ORDER — HYDROCODONE-ACETAMINOPHEN 5-325 MG PO TABS
ORAL_TABLET | ORAL | Status: AC
  Filled 2011-12-01: qty 1

## 2011-12-01 NOTE — ED Provider Notes (Signed)
History     CSN: [id number]  Arrival date & time 12/01/11  1500   First MD Initiated Contact with Patient 12/01/11 1500      Chief Complaint  Patient presents with  . Fall    (Consider location/radiation/quality/duration/timing/severity/associated sxs/prior treatment) HPI Comments: Pt states that she is primarily in a wheelchair because of her history of ataxia, but she is allowed to walk around home and she states that she slipped and fell this morning and now she is having right knee pain  Patient is a 41 y.o. female presenting with fall. The history is provided by the patient. No language interpreter was used.  Fall The accident occurred 3 to 5 hours ago. She landed on a hard floor. There was no blood loss. The point of impact was the right knee. The pain is present in the right knee. The pain is moderate. She was ambulatory at the scene. There was no entrapment after the fall. There was no drug use involved in the accident. There was no alcohol use involved in the accident.    Past Medical History  Diagnosis Date  . Arthritis   . Diabetes mellitus   . COPD (chronic obstructive pulmonary disease)   . Respiratory failure, acute     Past Surgical History  Procedure Date  . Cholecystectomy   . Knee arthroscopy     bilateral    Family History  Problem Relation Age of Onset  . Diabetes    . Asthma      History  Substance Use Topics  . Smoking status: Former Smoker -- 0.5 packs/day for 10 years    Quit date: 01/30/2010  . Smokeless tobacco: Never Used  . Alcohol Use: No    OB History    Grav Para Term Preterm Abortions TAB SAB Ect Mult Living                  Review of Systems  Constitutional: Negative.   Respiratory: Negative.   Cardiovascular: Negative.     Allergies  Review of patient's allergies indicates no known allergies.  Home Medications   Current Outpatient Rx  Name Route Sig Dispense Refill  . ALBUTEROL SULFATE (2.5 MG/3ML) 0.083% IN NEBU  Nebulization Take 2.5 mg by nebulization every 4 (four) hours as needed.      . ALBUTEROL SULFATE HFA 108 (90 BASE) MCG/ACT IN AERS Inhalation Inhale 2 puffs into the lungs every 4 (four) hours as needed.      . BUDESONIDE-FORMOTEROL FUMARATE 160-4.5 MCG/ACT IN AERO Inhalation Inhale 2 puffs into the lungs 2 (two) times daily.      . IBUPROFEN 800 MG PO TABS Oral Take 800 mg by mouth every 4 (four) hours as needed.    . OXYGEN-HELIUM IN Inhalation Inhale into the lungs.    Marland Kitchen TIOTROPIUM BROMIDE MONOHYDRATE 18 MCG IN CAPS Inhalation Place 1 capsule (18 mcg total) into inhaler and inhale daily. 30 capsule 2    BP 103/70  Pulse 103  Temp 98.1 F (36.7 C) (Oral)  Resp 18  SpO2 92%  Physical Exam  Nursing note and vitals reviewed. Constitutional: She is oriented to person, place, and time. She appears well-developed and well-nourished.  Cardiovascular: Normal rate and regular rhythm.   Pulmonary/Chest: Effort normal and breath sounds normal.  Musculoskeletal:       No gross deformity noted to the right knee:pt has full WUJ:WJXBJY intact  Neurological: She is alert and oriented to person, place, and time.  Skin: Skin  is dry.    ED Course  Procedures (including critical care time)  Labs Reviewed - No data to display Dg Knee Complete 4 Views Right  12/01/2011  *RADIOLOGY REPORT*  Clinical Data: Fall, landed on knee.  Knee pain.  RIGHT KNEE - COMPLETE 4+ VIEW  Comparison: 10/31/2008  Findings: No acute bony abnormality.  Specifically, no fracture, subluxation, or dislocation.  Soft tissues are intact.  No joint effusion.  IMPRESSION: Normal study.  Original Report Authenticated By: Cyndie Chime, M.D.     1. Knee pain       MDM  No acute injury noted:will treat symptomatically for pain       Teressa Lower, NP 12/01/11 1621

## 2011-12-01 NOTE — ED Notes (Signed)
Husband driving pt home

## 2011-12-01 NOTE — ED Provider Notes (Signed)
Medical screening examination/treatment/procedure(s) were performed by non-physician practitioner and as supervising physician I was immediately available for consultation/collaboration.  Yazlynn Birkeland   Ariv Penrod, MD 12/01/11 1626 

## 2011-12-01 NOTE — ED Notes (Signed)
States that earlier today,she slipped on wet floor at home , and landed on her right knee , c/o pain same; O2 sats noted to be 91-92 on room air, reportedly on 2L /min at home due to COPD; ice pack applied on arrival; moaning at intervals; no obvious deformity or echymosis noted on inspection

## 2012-01-06 ENCOUNTER — Encounter: Payer: Self-pay | Admitting: Pulmonary Disease

## 2012-01-06 ENCOUNTER — Ambulatory Visit (INDEPENDENT_AMBULATORY_CARE_PROVIDER_SITE_OTHER): Payer: Medicaid Other | Admitting: Pulmonary Disease

## 2012-01-06 VITALS — BP 94/60 | HR 118 | Temp 98.7°F | Ht 60.0 in | Wt 79.0 lb

## 2012-01-06 DIAGNOSIS — Z23 Encounter for immunization: Secondary | ICD-10-CM

## 2012-01-06 DIAGNOSIS — J449 Chronic obstructive pulmonary disease, unspecified: Secondary | ICD-10-CM

## 2012-01-06 NOTE — Assessment & Plan Note (Signed)
Unclear if copd is worse or whether her neurological situation is worse Not a great candidate for pulm rehab Flu shot Spiriva refills sent OK to take albuterol nebs 4 times /day if needed Call if breathing worse  I am not sure if we should consider hospice here if her neurological condition worsens - would like to know neuro prognosis We will request portable O2

## 2012-01-06 NOTE — Patient Instructions (Addendum)
Flu shot Take mucinex 600 twice daily We will write for portable oxygen Stay on symbicort & spiriva Get back with dr Anne Hahn

## 2012-01-06 NOTE — Progress Notes (Signed)
  Subjective:    Patient ID: [name], female    DOB: Mar 10, 1971, 41 y.o.   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 Alpha 1-antitrypsin was >301. Discharged on O2 . Has home health assistance.  PFTs 4/11 FEV1 0.84 (34%) She has smoked since age 19. Father had severe COPD.    01/06/2012 Wants portable O2  pt states no change in breathing since last OV. Pt states that her 02 tanks are not working properly and are leaking. Pt states she has been feeling lethargic Taking her symbicort and spiriva without missed doses. No flare in cough/dyspnea but appears worse No leg swelling.  Unclear if ataxia is worse, in wheelchair She remains off cigs, boyfriend smokes   Review of Systems neg for any significant sore throat, dysphagia, itching, sneezing, nasal congestion or excess/ purulent secretions, fever, chills, sweats, unintended wt loss, pleuritic or exertional cp, hempoptysis, orthopnea pnd or change in chronic leg swelling. Also denies presyncope, palpitations, heartburn, abdominal pain, nausea, vomiting, diarrhea or change in bowel or urinary habits, dysuria,hematuria, rash, arthralgias, visual complaints, headache, numbness weakness or ataxia.     Objective:   Physical Exam  Gen. Thin, small woman in wheelchair,  in no distress ENT - no lesions, no post nasal drip Neck: No JVD, no thyromegaly, no carotid bruits Lungs: no use of accessory muscles, no dullness to percussion, kyphotic, decreased without rales or rhonchi  Cardiovascular: Rhythm regular, heart sounds  normal, no murmurs or gallops, no peripheral edema Musculoskeletal: No deformities, no cyanosis or clubbing        Assessment & Plan:

## 2012-04-06 ENCOUNTER — Ambulatory Visit: Payer: Self-pay | Admitting: Adult Health

## 2012-04-09 ENCOUNTER — Ambulatory Visit (INDEPENDENT_AMBULATORY_CARE_PROVIDER_SITE_OTHER): Payer: Medicaid Other | Admitting: Adult Health

## 2012-04-09 ENCOUNTER — Encounter: Payer: Self-pay | Admitting: Adult Health

## 2012-04-09 VITALS — BP 126/82 | HR 99 | Temp 98.2°F | Ht 60.0 in | Wt 86.5 lb

## 2012-04-09 DIAGNOSIS — J449 Chronic obstructive pulmonary disease, unspecified: Secondary | ICD-10-CM

## 2012-04-09 NOTE — Patient Instructions (Addendum)
Restart and Continue on spiriva daily  Stay on symbicort twice daily  follow up Dr. Vassie Loll  In 3 months and As needed

## 2012-04-09 NOTE — Progress Notes (Signed)
  Subjective:    Patient ID: [name], female    DOB: Sep 01, 1970, 41 y.o.   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 Alpha 1-antitrypsin was >301. Discharged on O2 . Has home health assistance.  PFTs 4/11 FEV1 0.84 (34%) She has smoked since age 46. Father had severe COPD.  CXR 06/2101 >NAD   01/06/12  Wants portable O2  pt states no change in breathing since last OV. Pt states that her 02 tanks are not working properly and are leaking. Pt states she has been feeling lethargic Taking her symbicort and spiriva without missed doses. No flare in cough/dyspnea but appears worse No leg swelling.  Unclear if ataxia is worse, in wheelchair She remains off cigs, boyfriend smokes >>No changes   04/09/2012 Follow up  Returns for 3 month for COPD . Dyspnea is at baseline. No flare in cough or wheezing.  CAT score 32  Remains on spiriva and symbicort . Ran out of spiriva x 2 weeks. Needs new rx.  No ER or hospital admits.  No hemotpysis, no n/v/d or edema.   Review of Systems Constitutional:   No  weight loss, night sweats,  Fevers, chills, fatigue, or  lassitude.  HEENT:   No headaches,  Difficulty swallowing,  Tooth/dental problems, or  Sore throat,                No sneezing, itching, ear ache, nasal congestion, post nasal drip,   CV:  No chest pain,  Orthopnea, PND, swelling in lower extremities, anasarca, dizziness, palpitations, syncope.   GI  No heartburn, indigestion, abdominal pain, nausea, vomiting, diarrhea, change in bowel habits, loss of appetite, bloody stools.   Resp:   No non-productive cough,  No coughing up of blood.  No change in color of mucus.  No wheezing.  No chest wall deformity  Skin: no rash or lesions.  GU: no dysuria, change in color of urine, no urgency or frequency.  No flank pain, no hematuria   MS:  No  joint pain or swelling.  No decreased range of motion.     Psych:    No memory loss.         Objective:   Physical Exam   Gen. Thin, small woman in wheelchair,  in no distress ENT - no lesions, no post nasal drip Neck: No JVD, no thyromegaly, no carotid bruits Lungs: no use of accessory muscles, no dullness to percussion, kyphotic, decreased without rales or rhonchi  Cardiovascular: Rhythm regular, heart sounds  normal, no murmurs or gallops, no peripheral edema Musculoskeletal: No deformities, no cyanosis or clubbing        Assessment & Plan:

## 2012-04-09 NOTE — Assessment & Plan Note (Signed)
Compensated on present regimen Pneumovax and flu shot are up-to-date. Continue on oxygen Continue on Symbicort, and Spiriva. Follow up in 3 months and as needed

## 2012-04-19 ENCOUNTER — Encounter (HOSPITAL_COMMUNITY): Payer: Self-pay | Admitting: Emergency Medicine

## 2012-04-19 ENCOUNTER — Emergency Department (HOSPITAL_COMMUNITY)
Admission: EM | Admit: 2012-04-19 | Discharge: 2012-04-19 | Disposition: A | Discharge status: Home / Self Care | Payer: Medicaid Other | Attending: Emergency Medicine | Admitting: Emergency Medicine

## 2012-04-19 ENCOUNTER — Emergency Department (HOSPITAL_COMMUNITY): Payer: Medicaid Other

## 2012-04-19 DIAGNOSIS — Y9301 Activity, walking, marching and hiking: Secondary | ICD-10-CM | POA: Insufficient documentation

## 2012-04-19 DIAGNOSIS — J449 Chronic obstructive pulmonary disease, unspecified: Secondary | ICD-10-CM | POA: Insufficient documentation

## 2012-04-19 DIAGNOSIS — Z87891 Personal history of nicotine dependence: Secondary | ICD-10-CM | POA: Insufficient documentation

## 2012-04-19 DIAGNOSIS — E119 Type 2 diabetes mellitus without complications: Secondary | ICD-10-CM | POA: Insufficient documentation

## 2012-04-19 DIAGNOSIS — W19XXXA Unspecified fall, initial encounter: Secondary | ICD-10-CM

## 2012-04-19 DIAGNOSIS — Z79899 Other long term (current) drug therapy: Secondary | ICD-10-CM | POA: Insufficient documentation

## 2012-04-19 DIAGNOSIS — W010XXA Fall on same level from slipping, tripping and stumbling without subsequent striking against object, initial encounter: Secondary | ICD-10-CM | POA: Insufficient documentation

## 2012-04-19 DIAGNOSIS — J4489 Other specified chronic obstructive pulmonary disease: Secondary | ICD-10-CM | POA: Insufficient documentation

## 2012-04-19 DIAGNOSIS — S82843A Displaced bimalleolar fracture of unspecified lower leg, initial encounter for closed fracture: Secondary | ICD-10-CM | POA: Insufficient documentation

## 2012-04-19 DIAGNOSIS — Y929 Unspecified place or not applicable: Secondary | ICD-10-CM | POA: Insufficient documentation

## 2012-04-19 DIAGNOSIS — J9600 Acute respiratory failure, unspecified whether with hypoxia or hypercapnia: Secondary | ICD-10-CM | POA: Insufficient documentation

## 2012-04-19 DIAGNOSIS — S99912A Unspecified injury of left ankle, initial encounter: Secondary | ICD-10-CM

## 2012-04-19 DIAGNOSIS — S8990XA Unspecified injury of unspecified lower leg, initial encounter: Secondary | ICD-10-CM | POA: Insufficient documentation

## 2012-04-19 MED ORDER — HYDROCODONE-ACETAMINOPHEN 5-325 MG PO TABS: 2.0000 | ORAL_TABLET | ORAL | Status: DC | PRN

## 2012-04-19 MED ORDER — HYDROCODONE-ACETAMINOPHEN 5-325 MG PO TABS
2.0000 | ORAL_TABLET | Freq: Once | ORAL | Status: AC
  Administered 2012-04-19: 2 via ORAL
  Filled 2012-04-19: qty 2

## 2012-04-19 NOTE — ED Notes (Signed)
Pt arrives in wheelchair to exam room. Pt states she uses a wheelchair when she has to walk a long distance, but normally ambulates around the house. Pt state she has been unable to bear wt on L ankle since injuring it last Thurs.

## 2012-04-19 NOTE — ED Provider Notes (Signed)
History     CSN: [id number]  Arrival date & time 04/19/12  1230   First MD Initiated Contact with Patient 04/19/12 1520      Chief Complaint  Patient presents with  . Fall  . Ankle Pain    (Consider location/radiation/quality/duration/timing/severity/associated sxs/prior treatment) Patient is a 41 y.o. female presenting with fall and ankle pain. The history is provided by the patient and medical records.  Fall Pertinent negatives include no fever, no numbness, no abdominal pain and no headaches.  Ankle Pain  Pertinent negatives include no numbness.    [name] is a 41 y.o. female  with a hx of COPD, DM, arthritis presents to the Emergency Department complaining of acute, persistent, progressively worsening pain in the L ankle onset 1 week ago.  Pt was walking and tripped on her shoe, with her body onto her L leg.  Pt was evaluated by EMS at the time of the accident and advised to seek further treatment if it did not improve. Pt has not been weight bearing since the fall.  Associated symptoms include swelling and bruising.  Nothing makes it better and palpation makes it worse.  Pt denies fever, chills, headache, neck pain, back pain, chest pain or shortness of breath, abdominal pain, nausea, vomiting, diarrhea.     Past Medical History  Diagnosis Date  . Arthritis   . Diabetes mellitus   . COPD (chronic obstructive pulmonary disease)   . Respiratory failure, acute     Past Surgical History  Procedure Date  . Cholecystectomy   . Knee arthroscopy     bilateral    Family History  Problem Relation Age of Onset  . Diabetes    . Asthma      History  Substance Use Topics  . Smoking status: Former Smoker -- 0.5 packs/day for 10 years    Quit date: 01/30/2010  . Smokeless tobacco: Never Used  . Alcohol Use: No    OB History    Grav Para Term Preterm Abortions TAB SAB Ect Mult Living                  Review of Systems  Constitutional: Negative for fever  and chills.  HENT: Negative for neck pain and neck stiffness.   Respiratory: Negative for cough, choking and chest tightness.   Cardiovascular: Negative for chest pain.  Gastrointestinal: Negative for abdominal pain.  Musculoskeletal: Positive for joint swelling. Negative for back pain.  Skin: Positive for color change. Negative for wound.  Neurological: Negative for numbness and headaches.  Hematological: Does not bruise/bleed easily.  Psychiatric/Behavioral: Negative for confusion.  All other systems reviewed and are negative.    Allergies  Review of patient's allergies indicates no known allergies.  Home Medications   Current Outpatient Rx  Name  Route  Sig  Dispense  Refill  . ALBUTEROL SULFATE (2.5 MG/3ML) 0.083% IN NEBU   Nebulization   Take 2.5 mg by nebulization every 4 (four) hours as needed.           . ALBUTEROL SULFATE HFA 108 (90 BASE) MCG/ACT IN AERS   Inhalation   Inhale 2 puffs into the lungs every 4 (four) hours as needed.           . BUDESONIDE-FORMOTEROL FUMARATE 160-4.5 MCG/ACT IN AERO   Inhalation   Inhale 2 puffs into the lungs 2 (two) times daily.           Docia Barrier IN   Inhalation  Inhale into the lungs.         Marland Kitchen TIOTROPIUM BROMIDE MONOHYDRATE 18 MCG IN CAPS   Inhalation   Place 18 mcg into inhaler and inhale daily.         Marland Kitchen HYDROCODONE-ACETAMINOPHEN 5-325 MG PO TABS   Oral   Take 2 tablets by mouth every 4 (four) hours as needed for pain.   20 tablet   0     BP 111/88  Pulse 79  Temp 98.5 F (36.9 C) (Oral)  Resp 18  SpO2 94%  LMP 04/01/2012  Physical Exam  Nursing note and vitals reviewed. Constitutional: She appears well-developed and well-nourished. No distress.  HENT:  Head: Normocephalic and atraumatic.  Eyes: Conjunctivae normal are normal.  Cardiovascular: Normal rate, regular rhythm and intact distal pulses.        Capillary refill less than 3 seconds  Pulmonary/Chest: Effort normal and breath sounds  normal.  Musculoskeletal: She exhibits edema and tenderness.       Left ankle: She exhibits decreased range of motion, swelling, ecchymosis and deformity. She exhibits no laceration and normal pulse. tenderness. Lateral malleolus and medial malleolus tenderness found.       ROM: Significantly decreased secondary to pain Left ankle with significant ecchymosis and swelling, pulses intact,   Neurological: She is alert. Coordination normal. GCS eye subscore is 4. GCS verbal subscore is 5. GCS motor subscore is 6.       Sensation intact to dull and sharp Strength minimally decreased in the left ankle secondary to pain  Skin: Skin is warm and dry. She is not diaphoretic.    ED Course  Procedures (including critical care time)  Labs Reviewed - No data to display Dg Ankle Complete Left  04/19/2012  *RADIOLOGY REPORT*  Clinical Data: Fall, ankle pain  LEFT ANKLE COMPLETE - 3+ VIEW  Comparison: None.  Findings: Three views of the left ankle submitted.  There is oblique mild displaced fracture of distal left fibula.  Mild displaced fracture of distal tibia medial malleolus.  There is disruption of the ankle mortise with widening of the medial tibial talar joint space. Mild medial displacement of the distal tibia. Soft tissue swelling noted adjacent to medial and lateral malleolus.  IMPRESSION:  There is oblique mild displaced fracture of distal left fibula. Mild displaced fracture of distal tibia medial malleolus.  There is disruption of the ankle mortise with widening of the medial tibial talar joint space. Mild medial displacement of the distal tibia. Soft tissue swelling noted adjacent to medial and lateral malleolus.   Original Report Authenticated By: Natasha Mead, M.D.      1. Bimalleolar ankle fracture   2. Left ankle injury   3. Fall   4. Diabetes       MDM  [name] presents after fall with left ankle fracture x 1 week. Fractures bimalleolar including the distal left fibula with  mild displacement and no distal tibial medial malleolus. Patient with diabetes which will slow healing.  X-ray with: oblique mild displaced fracture of distal left fibula; Mild displaced fracture of distal tibia medial malleolus. Patient with good pulses and sensation, no concern for compartment syndrome, no open wound. Patient given pain control here in the department, splint placed. Patient will use her wheelchair instead of crutches. I discussed the patient is not to weight-bear on this leg at all.  I discussed the patient with Dr. Shelle Iron with [facility] will see her in his office tomorrow.  Discussed these  things with the patient, her splint is in place with adequate capillary refill after placement.  I have also discussed reasons to return immediately to the ER.  Patient expresses understanding and agrees with plan.  1. Medications: Vicodin, usual home medications 2. Treatment: rest, drink plenty of fluids, take medications as prescribed rest, ice, elevate, compress 3. Follow Up: Please followup with your primary doctor for discussion of your diagnoses and further evaluation after today's visit; if you do not have a primary care doctor use the resource guide provided to find one; followup with Dr. Shelle Iron tomorrow morning        Dierdre Forth, PA-C 04/19/12 1631

## 2012-04-19 NOTE — ED Notes (Signed)
Ortho tech at bedside 

## 2012-04-19 NOTE — ED Provider Notes (Signed)
Medical screening examination/treatment/procedure(s) were performed by non-physician practitioner and as supervising physician I was immediately available for consultation/collaboration.   Aluna Whiston Y. Melik Blancett, MD 04/19/12 2308 

## 2012-04-19 NOTE — ED Notes (Signed)
Pt reports tripped on her shoe and fell x 1 week.  Pt states that she called EMS and was advised to come to the ED if it got worse.  Pt's LLE appears swollen and bruised.

## 2012-04-19 NOTE — ED Notes (Signed)
Ortho tech paged for application of short leg posterior splint.

## 2012-04-20 ENCOUNTER — Other Ambulatory Visit: Payer: Self-pay | Admitting: Orthopedic Surgery

## 2012-04-20 ENCOUNTER — Encounter (HOSPITAL_COMMUNITY): Payer: Self-pay | Admitting: *Deleted

## 2012-04-23 ENCOUNTER — Encounter (HOSPITAL_COMMUNITY): Payer: Self-pay | Admitting: Certified Registered Nurse Anesthetist

## 2012-04-23 ENCOUNTER — Ambulatory Visit (HOSPITAL_COMMUNITY): Payer: Medicaid Other | Admitting: Certified Registered Nurse Anesthetist

## 2012-04-23 ENCOUNTER — Encounter (HOSPITAL_COMMUNITY): Payer: Self-pay | Admitting: *Deleted

## 2012-04-23 ENCOUNTER — Observation Stay (HOSPITAL_COMMUNITY)
Admission: RE | Admit: 2012-04-23 | Discharge: 2012-04-24 | Disposition: A | Discharge status: Home / Self Care | Payer: Medicaid Other | Source: Ambulatory Visit | Attending: Specialist | Admitting: Specialist

## 2012-04-23 ENCOUNTER — Ambulatory Visit (HOSPITAL_COMMUNITY): Payer: Medicaid Other

## 2012-04-23 ENCOUNTER — Encounter (HOSPITAL_COMMUNITY)
Admission: RE | Disposition: A | Discharge status: Home / Self Care | Payer: Self-pay | Source: Ambulatory Visit | Attending: Specialist

## 2012-04-23 DIAGNOSIS — J449 Chronic obstructive pulmonary disease, unspecified: Secondary | ICD-10-CM | POA: Insufficient documentation

## 2012-04-23 DIAGNOSIS — S82843A Displaced bimalleolar fracture of unspecified lower leg, initial encounter for closed fracture: Principal | ICD-10-CM | POA: Insufficient documentation

## 2012-04-23 DIAGNOSIS — Z79899 Other long term (current) drug therapy: Secondary | ICD-10-CM | POA: Insufficient documentation

## 2012-04-23 DIAGNOSIS — S62319A Displaced fracture of base of unspecified metacarpal bone, initial encounter for closed fracture: Secondary | ICD-10-CM

## 2012-04-23 DIAGNOSIS — X500XXA Overexertion from strenuous movement or load, initial encounter: Secondary | ICD-10-CM | POA: Insufficient documentation

## 2012-04-23 DIAGNOSIS — J4489 Other specified chronic obstructive pulmonary disease: Secondary | ICD-10-CM | POA: Insufficient documentation

## 2012-04-23 HISTORY — DX: Other fracture of unspecified lower leg, initial encounter for closed fracture: S82.899A

## 2012-04-23 HISTORY — DX: Other specified postprocedural states: Z98.890

## 2012-04-23 HISTORY — DX: Dependence on supplemental oxygen: Z99.81

## 2012-04-23 HISTORY — PX: ORIF ANKLE FRACTURE: SHX5408

## 2012-04-23 LAB — BASIC METABOLIC PANEL
CO2: 31 mEq/L (ref 19–32)
Calcium: 9.9 mg/dL (ref 8.4–10.5)
GFR calc non Af Amer: >90 mL/min (ref >90)
Sodium: 134 mEq/L — ABNORMAL LOW (ref 135–145)

## 2012-04-23 LAB — GLUCOSE, CAPILLARY

## 2012-04-23 LAB — CBC
MCV: 98.9 fL (ref 78.0–100.0)
Platelets: 152 10*3/uL (ref 150–400)
RBC: 4.6 MIL/uL (ref 3.87–5.11)
WBC: 4.5 10*3/uL (ref 4.0–10.5)

## 2012-04-23 LAB — HCG, SERUM, QUALITATIVE: Preg, Serum: NEGATIVE

## 2012-04-23 SURGERY — OPEN REDUCTION INTERNAL FIXATION (ORIF) ANKLE FRACTURE
Anesthesia: Spinal | Site: Ankle | Laterality: Left | Wound class: Clean

## 2012-04-23 MED ORDER — CHLORHEXIDINE GLUCONATE 4 % EX LIQD
60.0000 mL | Freq: Once | CUTANEOUS | Status: DC
  Filled 2012-04-23: qty 60

## 2012-04-23 MED ORDER — ALBUTEROL SULFATE HFA 108 (90 BASE) MCG/ACT IN AERS
2.0000 | INHALATION_SPRAY | RESPIRATORY_TRACT | Status: DC | PRN
  Administered 2012-04-23: 2 via RESPIRATORY_TRACT
  Filled 2012-04-23: qty 6.7

## 2012-04-23 MED ORDER — LACTATED RINGERS IV SOLN: INTRAVENOUS | Status: DC

## 2012-04-23 MED ORDER — KETOROLAC TROMETHAMINE 15 MG/ML IJ SOLN
INTRAMUSCULAR | Status: DC | PRN
  Administered 2012-04-23: 15 mg via INTRAVENOUS

## 2012-04-23 MED ORDER — EPHEDRINE SULFATE 50 MG/ML IJ SOLN
INTRAMUSCULAR | Status: DC | PRN
  Administered 2012-04-23: 5 mg via INTRAVENOUS

## 2012-04-23 MED ORDER — FENTANYL CITRATE 0.05 MG/ML IJ SOLN
INTRAMUSCULAR | Status: DC | PRN
  Administered 2012-04-23 (×2): 25 ug via INTRAVENOUS

## 2012-04-23 MED ORDER — METHOCARBAMOL 500 MG PO TABS
500.0000 mg | ORAL_TABLET | Freq: Four times a day (QID) | ORAL | Status: DC | PRN
  Administered 2012-04-24: 500 mg via ORAL
  Filled 2012-04-23: qty 1

## 2012-04-23 MED ORDER — ALBUTEROL SULFATE (5 MG/ML) 0.5% IN NEBU: 2.5000 mg | INHALATION_SOLUTION | RESPIRATORY_TRACT | Status: DC | PRN

## 2012-04-23 MED ORDER — METOCLOPRAMIDE HCL 10 MG PO TABS: 5.0000 mg | ORAL_TABLET | Freq: Three times a day (TID) | ORAL | Status: DC | PRN

## 2012-04-23 MED ORDER — CLINDAMYCIN PHOSPHATE 600 MG/50ML IV SOLN
600.0000 mg | Freq: Once | INTRAVENOUS | Status: AC
  Administered 2012-04-23: 600 mg via INTRAVENOUS
  Filled 2012-04-23: qty 50

## 2012-04-23 MED ORDER — PANTOPRAZOLE SODIUM 40 MG PO TBEC
40.0000 mg | DELAYED_RELEASE_TABLET | Freq: Every day | ORAL | Status: DC
  Administered 2012-04-24: 40 mg via ORAL
  Filled 2012-04-23: qty 1

## 2012-04-23 MED ORDER — OXYCODONE-ACETAMINOPHEN 5-325 MG PO TABS: 1.0000 | ORAL_TABLET | ORAL | Status: DC | PRN

## 2012-04-23 MED ORDER — MUPIROCIN 2 % EX OINT
TOPICAL_OINTMENT | Freq: Two times a day (BID) | CUTANEOUS | Status: DC
  Filled 2012-04-23: qty 22

## 2012-04-23 MED ORDER — ENOXAPARIN SODIUM 30 MG/0.3ML ~~LOC~~ SOLN
30.0000 mg | Freq: Once | SUBCUTANEOUS | Status: AC
  Administered 2012-04-24: 30 mg via SUBCUTANEOUS
  Filled 2012-04-23: qty 0.3

## 2012-04-23 MED ORDER — BUPIVACAINE HCL (PF) 0.75 % IJ SOLN
INTRAMUSCULAR | Status: DC | PRN
  Administered 2012-04-23: 13 mg via INTRATHECAL

## 2012-04-23 MED ORDER — HYDROCODONE-ACETAMINOPHEN 5-325 MG PO TABS: 1.0000 | ORAL_TABLET | Freq: Four times a day (QID) | ORAL | Status: DC | PRN

## 2012-04-23 MED ORDER — PROPOFOL INFUSION 10 MG/ML OPTIME
INTRAVENOUS | Status: DC | PRN
  Administered 2012-04-23: 140 ug/kg/min via INTRAVENOUS

## 2012-04-23 MED ORDER — SENNA 8.6 MG PO TABS
1.0000 | ORAL_TABLET | Freq: Two times a day (BID) | ORAL | Status: DC
  Administered 2012-04-23 – 2012-04-24 (×2): 8.6 mg via ORAL
  Filled 2012-04-23 (×2): qty 1

## 2012-04-23 MED ORDER — ZOLPIDEM TARTRATE 5 MG PO TABS: 5.0000 mg | ORAL_TABLET | Freq: Every evening | ORAL | Status: DC | PRN

## 2012-04-23 MED ORDER — ONDANSETRON HCL 4 MG PO TABS: 4.0000 mg | ORAL_TABLET | Freq: Four times a day (QID) | ORAL | Status: DC | PRN

## 2012-04-23 MED ORDER — HYDROMORPHONE HCL PF 1 MG/ML IJ SOLN: 0.2500 mg | INTRAMUSCULAR | Status: DC | PRN

## 2012-04-23 MED ORDER — LACTATED RINGERS IV SOLN
INTRAVENOUS | Status: DC
  Administered 2012-04-23: 23:00:00 via INTRAVENOUS

## 2012-04-23 MED ORDER — METHOCARBAMOL 100 MG/ML IJ SOLN
500.0000 mg | Freq: Four times a day (QID) | INTRAVENOUS | Status: DC | PRN
  Filled 2012-04-23: qty 5

## 2012-04-23 MED ORDER — LIDOCAINE HCL (PF) 1 % IJ SOLN
INTRAMUSCULAR | Status: DC | PRN
  Administered 2012-04-23: 6 mL

## 2012-04-23 MED ORDER — HYDROCODONE-ACETAMINOPHEN 5-325 MG PO TABS
1.0000 | ORAL_TABLET | ORAL | Status: DC | PRN
  Administered 2012-04-23 (×2): 1 via ORAL
  Administered 2012-04-24 (×3): 2 via ORAL
  Filled 2012-04-23: qty 1
  Filled 2012-04-23 (×3): qty 2

## 2012-04-23 MED ORDER — DIPHENHYDRAMINE HCL 12.5 MG/5ML PO ELIX: 12.5000 mg | ORAL_SOLUTION | ORAL | Status: DC | PRN

## 2012-04-23 MED ORDER — TIOTROPIUM BROMIDE MONOHYDRATE 18 MCG IN CAPS
18.0000 ug | ORAL_CAPSULE | Freq: Every day | RESPIRATORY_TRACT | Status: DC
  Administered 2012-04-24: 18 ug via RESPIRATORY_TRACT
  Filled 2012-04-23: qty 5

## 2012-04-23 MED ORDER — BUDESONIDE-FORMOTEROL FUMARATE 160-4.5 MCG/ACT IN AERO
2.0000 | INHALATION_SPRAY | Freq: Two times a day (BID) | RESPIRATORY_TRACT | Status: DC
  Administered 2012-04-23 – 2012-04-24 (×2): 2 via RESPIRATORY_TRACT
  Filled 2012-04-23: qty 6

## 2012-04-23 MED ORDER — ACETAMINOPHEN 10 MG/ML IV SOLN
INTRAVENOUS | Status: AC
  Filled 2012-04-23: qty 100

## 2012-04-23 MED ORDER — HYDROCODONE-ACETAMINOPHEN 5-325 MG PO TABS
1.0000 | ORAL_TABLET | ORAL | Status: DC | PRN
  Filled 2012-04-23: qty 1

## 2012-04-23 MED ORDER — MIDAZOLAM HCL 5 MG/5ML IJ SOLN
INTRAMUSCULAR | Status: DC | PRN
  Administered 2012-04-23 (×2): 0.5 mg via INTRAVENOUS

## 2012-04-23 MED ORDER — LACTATED RINGERS IV SOLN
INTRAVENOUS | Status: DC
  Administered 2012-04-23: 1000 mL via INTRAVENOUS

## 2012-04-23 MED ORDER — ACETAMINOPHEN 10 MG/ML IV SOLN
INTRAVENOUS | Status: DC | PRN
  Administered 2012-04-23: 1000 mg via INTRAVENOUS

## 2012-04-23 MED ORDER — CEFAZOLIN SODIUM-DEXTROSE 2-3 GM-% IV SOLR
2.0000 g | INTRAVENOUS | Status: AC
  Administered 2012-04-23: 2 g via INTRAVENOUS

## 2012-04-23 MED ORDER — DEXAMETHASONE SODIUM PHOSPHATE 4 MG/ML IJ SOLN
INTRAMUSCULAR | Status: DC | PRN
  Administered 2012-04-23: 5 mg via INTRAVENOUS

## 2012-04-23 MED ORDER — CEFAZOLIN SODIUM 1-5 GM-% IV SOLN
1.0000 g | Freq: Three times a day (TID) | INTRAVENOUS | Status: AC
  Administered 2012-04-23 – 2012-04-24 (×2): 1 g via INTRAVENOUS
  Filled 2012-04-23 (×2): qty 50

## 2012-04-23 MED ORDER — SODIUM CHLORIDE 0.9 % IR SOLN
Status: DC | PRN
  Administered 2012-04-23: 17:00:00

## 2012-04-23 MED ORDER — DOCUSATE SODIUM 100 MG PO CAPS
100.0000 mg | ORAL_CAPSULE | Freq: Two times a day (BID) | ORAL | Status: DC
  Administered 2012-04-23 – 2012-04-24 (×2): 100 mg via ORAL

## 2012-04-23 MED ORDER — CEFAZOLIN SODIUM-DEXTROSE 2-3 GM-% IV SOLR
INTRAVENOUS | Status: AC
  Filled 2012-04-23: qty 50

## 2012-04-23 MED ORDER — ONDANSETRON HCL 4 MG/2ML IJ SOLN
INTRAMUSCULAR | Status: DC | PRN
  Administered 2012-04-23: 4 mg via INTRAVENOUS

## 2012-04-23 MED ORDER — ONDANSETRON HCL 4 MG/2ML IJ SOLN: 4.0000 mg | Freq: Four times a day (QID) | INTRAMUSCULAR | Status: DC | PRN

## 2012-04-23 MED ORDER — MORPHINE SULFATE 2 MG/ML IJ SOLN: 1.0000 mg | INTRAMUSCULAR | Status: DC | PRN

## 2012-04-23 MED ORDER — METOCLOPRAMIDE HCL 5 MG/ML IJ SOLN: 5.0000 mg | Freq: Three times a day (TID) | INTRAMUSCULAR | Status: DC | PRN

## 2012-04-23 SURGICAL SUPPLY — 56 items
BAG ZIPLOCK 12X15 (MISCELLANEOUS) ×2 IMPLANT
BANDAGE ELASTIC 4 VELCRO ST LF (GAUZE/BANDAGES/DRESSINGS) ×2 IMPLANT
BANDAGE ELASTIC 6 VELCRO ST LF (GAUZE/BANDAGES/DRESSINGS) ×2 IMPLANT
BANDAGE GAUZE ELAST BULKY 4 IN (GAUZE/BANDAGES/DRESSINGS) IMPLANT
BIT DRILL 2.5X2.75 QC CALB (BIT) ×2 IMPLANT
CLOTH BEACON ORANGE TIMEOUT ST (SAFETY) ×2 IMPLANT
COVER MAYO STAND STRL (DRAPES) ×2 IMPLANT
CUFF TOURN SGL QUICK 24 (TOURNIQUET CUFF) ×1
CUFF TRNQT CYL 24X4X40X1 (TOURNIQUET CUFF) ×1 IMPLANT
DRAPE C-ARM 42X72 X-RAY (DRAPES) ×2 IMPLANT
DRAPE POUCH INSTRU U-SHP 10X18 (DRAPES) ×2 IMPLANT
DRAPE U-SHAPE 47X51 STRL (DRAPES) ×2 IMPLANT
DRSG ADAPTIC 3X8 NADH LF (GAUZE/BANDAGES/DRESSINGS) ×2 IMPLANT
DRSG PAD ABDOMINAL 8X10 ST (GAUZE/BANDAGES/DRESSINGS) IMPLANT
DURAPREP 26ML APPLICATOR (WOUND CARE) ×2 IMPLANT
ELECT REM PT RETURN 9FT ADLT (ELECTROSURGICAL) ×2
ELECTRODE REM PT RTRN 9FT ADLT (ELECTROSURGICAL) ×1 IMPLANT
GLOVE BIOGEL PI IND STRL 7.5 (GLOVE) ×1 IMPLANT
GLOVE BIOGEL PI IND STRL 8 (GLOVE) IMPLANT
GLOVE BIOGEL PI INDICATOR 7.5 (GLOVE) ×1
GLOVE BIOGEL PI INDICATOR 8 (GLOVE)
GLOVE SURG SS PI 7.5 STRL IVOR (GLOVE) ×2 IMPLANT
GLOVE SURG SS PI 8.0 STRL IVOR (GLOVE) ×4 IMPLANT
GOWN PREVENTION PLUS LG XLONG (DISPOSABLE) ×2 IMPLANT
GOWN STRL REIN XL XLG (GOWN DISPOSABLE) ×2 IMPLANT
K-WIRE ACE 1.6X6 (WIRE) ×2
KIT BASIN OR (CUSTOM PROCEDURE TRAY) ×2 IMPLANT
KWIRE ACE 1.6X6 (WIRE) ×1 IMPLANT
MANIFOLD NEPTUNE II (INSTRUMENTS) ×2 IMPLANT
NEEDLE HYPO 22GX1.5 SAFETY (NEEDLE) IMPLANT
PACK LOWER EXTREMITY WL (CUSTOM PROCEDURE TRAY) ×2 IMPLANT
PAD ABD 7.5X8 STRL (GAUZE/BANDAGES/DRESSINGS) ×2 IMPLANT
PAD CAST 4YDX4 CTTN HI CHSV (CAST SUPPLIES) ×1 IMPLANT
PADDING CAST ABS 6INX4YD NS (CAST SUPPLIES) ×1
PADDING CAST ABS COTTON 6X4 NS (CAST SUPPLIES) ×1 IMPLANT
PADDING CAST COTTON 4X4 STRL (CAST SUPPLIES) ×1
PLATE ACE 100DEG 6HOLE (Plate) ×2 IMPLANT
POSITIONER SURGICAL ARM (MISCELLANEOUS) ×2 IMPLANT
SCREW ACE CAN 4.0 40M (Screw) ×2 IMPLANT
SCREW CORTICAL 3.5MM  12MM (Screw) IMPLANT
SCREW CORTICAL 3.5MM  16MM (Screw) ×1 IMPLANT
SCREW CORTICAL 3.5MM 12MM (Screw) IMPLANT
SCREW CORTICAL 3.5MM 14MM (Screw) ×4 IMPLANT
SCREW CORTICAL 3.5MM 16MM (Screw) ×1 IMPLANT
SCREW CORTICAL 3.5MM 18MM (Screw) ×2 IMPLANT
SCREW NLOCK CANC HEX 4X18 (Screw) ×1 IMPLANT
SCREW NLOCK CANC HEX 4X18 FIB (Screw) ×1 IMPLANT
SPONGE GAUZE 4X4 12PLY (GAUZE/BANDAGES/DRESSINGS) IMPLANT
SUCTION FRAZIER 12FR DISP (SUCTIONS) ×2 IMPLANT
SUT ETHILON 4 0 PS 2 18 (SUTURE) IMPLANT
SUT VIC AB 1 CT1 27 (SUTURE) ×2
SUT VIC AB 1 CT1 27XBRD ANTBC (SUTURE) ×2 IMPLANT
SUT VIC AB 2-0 CT1 27 (SUTURE) ×2
SUT VIC AB 2-0 CT1 TAPERPNT 27 (SUTURE) ×2 IMPLANT
SYR CONTROL 10ML LL (SYRINGE) IMPLANT
WASHER CUP TIMAX (Trauma) ×2 IMPLANT

## 2012-04-23 NOTE — H&P (Signed)
[name] is an 41 y.o. female.   Chief Complaint: left ankle pain HPI: left ankle pain s/p rotational type injury 12/12. Pt initially thought pain would go away. Was seen in ER 12/19 and at [facility] by myself and Dr. Shelle Iron 12/20 dx with bimalleolar ankle fx. She has remained NWB since injury. Noted continued pain and swelling. She was placed in a posterior and sugartong splint. Denies any past hx of prior ankle injury.  Past Medical History  Diagnosis Date  . Arthritis   . COPD (chronic obstructive pulmonary disease)   . Respiratory failure, acute   . Diabetes mellitus     borderline  . Hx of toe surgery     X 2  . Ankle fracture     LEFT  . On supplemental oxygen therapy     2 L continuous    Past Surgical History  Procedure Date  . Cholecystectomy   . Knee arthroscopy     bilateral  . Toe surgery     x2    Family History  Problem Relation Age of Onset  . Diabetes    . Asthma     Social History:  reports that she quit smoking about 2 years ago. She has never used smokeless tobacco. She reports that she does not drink alcohol or use illicit drugs.  Allergies: No Known Allergies  Medications Prior to Admission  Medication Sig Dispense Refill  . albuterol (PROVENTIL) (2.5 MG/3ML) 0.083% nebulizer solution Take 2.5 mg by nebulization every 4 (four) hours as needed.        Marland Kitchen albuterol (VENTOLIN HFA) 108 (90 BASE) MCG/ACT inhaler Inhale 2 puffs into the lungs every 4 (four) hours as needed.        . budesonide-formoterol (SYMBICORT) 160-4.5 MCG/ACT inhaler Inhale 2 puffs into the lungs 2 (two) times daily.       Marland Kitchen HYDROcodone-acetaminophen (NORCO/VICODIN) 5-325 MG per tablet Take 2 tablets by mouth every 4 (four) hours as needed for pain.  20 tablet  0  . tiotropium (SPIRIVA) 18 MCG inhalation capsule Place 18 mcg into inhaler and inhale daily.        Results for orders placed during the hospital encounter of 04/23/12 (from the past 48 hour(s))   SURGICAL PCR SCREEN     Status: Normal   Collection Time   04/23/12 12:57 PM      Component Value Range Comment   MRSA, PCR NEGATIVE  NEGATIVE    Staphylococcus aureus NEGATIVE  NEGATIVE   HCG, SERUM, QUALITATIVE     Status: Normal   Collection Time   04/23/12  1:27 PM      Component Value Range Comment   Preg, Serum NEGATIVE  NEGATIVE   BASIC METABOLIC PANEL     Status: Abnormal   Collection Time   04/23/12  1:29 PM      Component Value Range Comment   Sodium 134 (*) 135 - 145 mEq/L    Potassium 4.4  3.5 - 5.1 mEq/L    Chloride 93 (*) 96 - 112 mEq/L    CO2 31  19 - 32 mEq/L    Glucose, Bld 92  70 - 99 mg/dL    BUN 4 (*) 6 - 23 mg/dL    Creatinine, Ser 8.65 (*) 0.50 - 1.10 mg/dL    Calcium 9.9  8.4 - 78.4 mg/dL    GFR calc non Af Amer >90  >90 mL/min    GFR calc Af Amer >90  >90  mL/min   CBC     Status: Abnormal   Collection Time   04/23/12  1:29 PM      Component Value Range Comment   WBC 4.5  4.0 - 10.5 K/uL    RBC 4.60  3.87 - 5.11 MIL/uL    Hemoglobin 16.3 (*) 12.0 - 15.0 g/dL    HCT 95.6  21.3 - 08.6 %    MCV 98.9  78.0 - 100.0 fL    MCH 35.4 (*) 26.0 - 34.0 pg    MCHC 35.8  30.0 - 36.0 g/dL    RDW 57.8  46.9 - 62.9 %    Platelets 152  150 - 400 K/uL    No results found.  Review of Systems  Constitutional: Negative.   HENT: Negative.   Eyes: Negative.   Respiratory: Negative.   Cardiovascular: Negative.   Gastrointestinal: Negative.   Genitourinary: Negative.   Musculoskeletal: Positive for joint pain and falls.  Skin: Negative.   Neurological: Negative.   Endo/Heme/Allergies: Negative.   Psychiatric/Behavioral: Negative.     Blood pressure 104/62, pulse 70, temperature 98 F (36.7 C), temperature source Oral, resp. rate 18, height 5' (1.524 m), weight 38.8 kg (85 lb 8.6 oz), last menstrual period 04/01/2012, SpO2 92.00%. Physical Exam  Constitutional: She is oriented to person, place, and time. She appears well-developed and well-nourished.  HENT:   Head: Normocephalic and atraumatic.  Eyes: Pupils are equal, round, and reactive to light.  Neck: Normal range of motion. Neck supple.  Cardiovascular: Normal rate, regular rhythm and normal heart sounds.   Respiratory: Effort normal and breath sounds normal.  GI: Soft. Bowel sounds are normal.  Musculoskeletal:       Diffuse swelling and tenderness about left ankle. Swelling into dorsum L foot. Decreased ROM and strength secondary to pain and injury. Sensation intact. Good pedal pulses. NVI distally  Neurological: She is alert and oriented to person, place, and time. She has normal reflexes.  Skin: Skin is warm and dry.     Assessment/Plan Left ankle xrays with bimalleolar fx, slightly displaced distal fibular component, with mortise widening  Dx: L ankle bimalleolar fx Plan: plan to proceed with ORIF L ankle fx today. Discussed risks, complications, and alternatives including but not limited to DVT, infx, bleeding, failure of procedure, need for secondary procedure, malunion, nonunion, anesthesia risk, even death. Pt desires to proceed. She is a nonsmoker. She is diabetic and she understands this can increase her chance for infx and delayed healing. Will remain NWB post-op at least 6 weeks. DVT ppx.  Santana, Patricia M. 04/23/2012, 3:19 PM

## 2012-04-23 NOTE — Preoperative (Signed)
Beta Blockers   Reason not to administer Beta Blockers:Not Applicable 

## 2012-04-23 NOTE — Anesthesia Procedure Notes (Signed)
Spinal  Patient location during procedure: OR Start time: 04/23/2012 4:04 PM End time: 04/23/2012 4:09 PM Staffing Anesthesiologist: Ronelle Nigh L Performed by: anesthesiologist  Preanesthetic Checklist Completed: patient identified, site marked, surgical consent, pre-op evaluation, timeout performed, IV checked, risks and benefits discussed and monitors and equipment checked Spinal Block Patient position: sitting Prep: Betadine Patient monitoring: heart rate, continuous pulse ox and blood pressure Approach: midline Location: L3-4 Injection technique: single-shot Needle Needle type: Whitacre  Needle gauge: 25 G Needle length: 9 cm Assessment Sensory level: T6 Additional Notes Expiration date of kit checked and confirmed. Patient tolerated procedure well, without complications.

## 2012-04-23 NOTE — Transfer of Care (Signed)
Immediate Anesthesia Transfer of Care Note  Patient: [name]  Procedure(s) Performed: Procedure(s) (LRB) with comments: OPEN REDUCTION INTERNAL FIXATION (ORIF) ANKLE FRACTURE (Left)  Patient Location: PACU  Anesthesia Type:Spinal  Level of Consciousness: awake, alert  and oriented  Airway & Oxygen Therapy: Patient Spontanous Breathing and Patient connected to face mask oxygen  Post-op Assessment: Report given to PACU RN and Post -op Vital signs reviewed and stable  Post vital signs: Reviewed and stable  Complications: No apparent anesthesia complications

## 2012-04-23 NOTE — Anesthesia Preprocedure Evaluation (Signed)
Anesthesia Evaluation  Patient identified by MRN, date of birth, ID band Patient awake    Reviewed: Allergy & Precautions, H&P , NPO status , Patient's Chart, lab work & pertinent test results  Airway Mallampati: II TM Distance: >3 FB Neck ROM: full    Dental No notable dental hx. 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  negative psych ROS   GI/Hepatic negative GI ROS, Neg liver ROS,   Endo/Other  diabetesBorderline DM  Renal/GU negative Renal ROS  negative genitourinary   Musculoskeletal   Abdominal   Peds  Hematology negative hematology ROS (+)   Anesthesia Other Findings   Reproductive/Obstetrics negative OB ROS                           Anesthesia Physical Anesthesia Plan  ASA: III  Anesthesia Plan: Spinal   Post-op Pain Management:    Induction:   Airway Management Planned: Simple Face Mask  Additional Equipment:   Intra-op Plan:   Post-operative Plan:   Informed Consent: I have reviewed the patients History and Physical, chart, labs and discussed the procedure including the risks, benefits and alternatives for the proposed anesthesia with the patient or authorized representative who has indicated his/her understanding and acceptance.   Dental Advisory Given  Plan Discussed with: CRNA and Surgeon  Anesthesia Plan Comments:         Anesthesia Quick Evaluation

## 2012-04-23 NOTE — Brief Op Note (Signed)
04/23/2012  5:47 PM  PATIENT:  [name]  41 y.o. female  PRE-OPERATIVE DIAGNOSIS:  LEFT ANKLE BIMALLEOLAR FRACTURE  POST-OPERATIVE DIAGNOSIS:  LEFT ANKLE BIMALLEOLAR FRACTURE  PROCEDURE:  Procedure(s) (LRB) with comments: OPEN REDUCTION INTERNAL FIXATION (ORIF) ANKLE FRACTURE (Left)  SURGEON:  Surgeon(s) and Role:    * Javier Docker, MD - Primary  PHYSICIAN ASSISTANT:   ASSISTANTS: Bissell   ANESTHESIA:   general  EBL:  Total I/O In: -  Out: 75 [Blood:75]  BLOOD ADMINISTERED:none  DRAINS: none   LOCAL MEDICATIONS USED:  MARCAINE     SPECIMEN:  No Specimen  DISPOSITION OF SPECIMEN:  N/A  COUNTS:  YES  TOURNIQUET:  * Missing tourniquet times found for documented tourniquets in log:  [id number] *  DICTATION: .Other Dictation: Dictation Number [id number]  PLAN OF CARE: Admit for overnight observation  PATIENT DISPOSITION:  PACU - hemodynamically stable.   Delay start of Pharmacological VTE agent (>24hrs) due to surgical blood loss or risk of bleeding: no

## 2012-04-24 LAB — BASIC METABOLIC PANEL
BUN: 4 mg/dL — ABNORMAL LOW (ref 6–23)
CO2: 31 mEq/L (ref 19–32)
Chloride: 94 mEq/L — ABNORMAL LOW (ref 96–112)
Glucose, Bld: 147 mg/dL — ABNORMAL HIGH (ref 70–99)
Potassium: 3.9 mEq/L (ref 3.5–5.1)
Sodium: 132 mEq/L — ABNORMAL LOW (ref 135–145)

## 2012-04-24 LAB — GLUCOSE, CAPILLARY
Glucose-Capillary: 142 mg/dL — ABNORMAL HIGH (ref 70–99)
Glucose-Capillary: 98 mg/dL (ref 70–99)

## 2012-04-24 NOTE — Progress Notes (Signed)
Pt to d/c home. AVS reviewed and "My Chart" discussed with pt. Pt capable of verbalizing medications and follow-up appointments. Remains hemodynamically stable. No signs and symptoms of distress. Educated pt to return to ER in the case of SOB, dizziness, or chest pain.  

## 2012-04-24 NOTE — Progress Notes (Signed)
Utilization review completed.  

## 2012-04-24 NOTE — Evaluation (Signed)
Physical Therapy Evaluation Patient Details Name: [name] MRN: [id number] DOB: August 29, 1970 Today's Date: 04/24/2012 Time: 8657-8469 PT Time Calculation (min): 28 min  PT Assessment / Plan / Recommendation Clinical Impression  Pt s/p anikle fx presents with functional mobility limitations 2* NWB on L, pain, and premorbid balance issues.    PT Assessment  Patient needs continued PT services    Follow Up Recommendations       Does the patient have the potential to tolerate intense rehabilitation      Barriers to Discharge        Equipment Recommendations  None recommended by PT    Recommendations for Other Services     Frequency 7X/week    Precautions / Restrictions Precautions Precautions: Fall Restrictions Weight Bearing Restrictions: Yes RLE Weight Bearing: Non weight bearing   Pertinent Vitals/Pain       Mobility  Bed Mobility Bed Mobility: Supine to Sit;Sit to Supine Supine to Sit: 5: Supervision Sit to Supine: 5: Supervision Transfers Transfers: Sit to Stand;Stand to Sit Sit to Stand: 4: Min assist Stand to Sit: 4: Min assist Details for Transfer Assistance: cues for LE management and use of UEs to self assist Ambulation/Gait Ambulation/Gait Assistance: 4: Min assist Ambulation Distance (Feet): 24 Feet Assistive device: Rolling walker Ambulation/Gait Assistance Details: cues for posture, position from RW, and management of L LE to insure NWB Gait Pattern: Step-to pattern General Gait Details: Pt demonstrating moderate general instability with gait - states has poor balance prior to this and utilized w/c to go out of house Stairs: No    Shoulder Instructions     Exercises     PT Diagnosis: Difficulty walking  PT Problem List: Decreased strength;Decreased range of motion;Decreased mobility;Decreased activity tolerance;Decreased knowledge of use of DME;Decreased safety awareness;Pain;Decreased balance PT Treatment Interventions: DME  instruction;Gait training;Stair training;Functional mobility training;Therapeutic activities;Therapeutic exercise;Patient/family education   PT Goals Acute Rehab PT Goals PT Goal Formulation: With patient Time For Goal Achievement: 04/26/12 Potential to Achieve Goals: Good Pt will go Sit to Stand: with supervision PT Goal: Sit to Stand - Progress: Goal set today Pt will go Stand to Sit: with supervision PT Goal: Stand to Sit - Progress: Goal set today Pt will Ambulate: 16 - 50 feet;with supervision;with rolling walker PT Goal: Ambulate - Progress: Goal set today  Visit Information  Last PT Received On: 04/24/12 Assistance Needed: +1    Subjective Data  Subjective: I only walked in the house.  Otherwise I used the w/c because of my balance and breathing.  I broke my ankle 2 weeks ago and my bf was carrying me around because I couldn't walk Patient Stated Goal: Resume previous lifestyle   Prior Functioning  Home Living Lives With: Significant other Available Help at Discharge: Family Type of Home: House Home Access: Stairs to enter [employer] of Steps: 4 Entrance Stairs-Rails: Right;Left Home Layout: One level Home Adaptive Equipment: Walker - rolling;Wheelchair - manual Prior Function Level of Independence: Independent;Independent with assistive device(s);Needs assistance Able to Take Stairs?: No Vocation: On disability Communication Communication: No difficulties    Cognition  Overall Cognitive Status: Appears within functional limits for tasks assessed/performed Arousal/Alertness: Awake/alert Orientation Level: Appears intact for tasks assessed Behavior During Session: [facility] for tasks performed    Extremity/Trunk Assessment Right Upper Extremity Assessment RUE ROM/Strength/Tone: [facility] for tasks assessed Left Upper Extremity Assessment LUE ROM/Strength/Tone: [facility] for tasks assessed Right Lower Extremity Assessment RLE ROM/Strength/Tone: [facility] for tasks  assessed Left Lower Extremity Assessment LLE ROM/Strength/Tone: Deficits  LLE ROM/Strength/Tone Deficits: ankle in cast   Balance    End of Session PT - End of Session Equipment Utilized During Treatment: Gait belt Activity Tolerance: Patient tolerated treatment well Patient left: in chair;with call bell/phone within reach Nurse Communication: Mobility status  GP     Teagan Ozawa 04/24/2012, 12:46 PM

## 2012-04-24 NOTE — Care Management Note (Addendum)
    Page 1 of 2   04/24/2012     3:08:27 PM   CARE MANAGEMENT NOTE 04/24/2012  Patient:  [name], [name]   Account Number:  [id number]  Date Initiated:  04/24/2012  Documentation initiated by:  Lorenda Ishihara  Subjective/Objective Assessment:   41 yo female admitted s/p ORIF of ankle. PTA lived at home with a friend.     Action/Plan:   Home when stable   Anticipated DC Date:  04/24/2012   Anticipated DC Plan:  HOME/SELF CARE      DC Planning Services  CM consult      PAC Choice  DURABLE MEDICAL EQUIPMENT  HOME HEALTH   Choice offered to / List presented to:  C-1 Patient   DME arranged  3-N-1      DME agency  [facility]     [facility] arranged  HH-1 RN      [facility] agency  [facility]   Status of service:  Completed, signed off Medicare Important Message given?   (If response is "NO", the following Medicare IM given date fields will be blank) Date Medicare IM given:   Date Additional Medicare IM given:    Discharge Disposition:  HOME W HOME HEALTH SERVICES  Per UR Regulation:  Reviewed for med. necessity/level of care/duration of stay  If discussed at Long Length of Stay Meetings, dates discussed:    Comments:

## 2012-04-24 NOTE — Progress Notes (Signed)
Pt's CBG was 248 at 2248. Paged Avel Peace who said to continue to monitor pt and recheck in am. If still high, Dr. Stann Mainland address in am. Will continue to monitor pt.

## 2012-04-24 NOTE — Progress Notes (Signed)
Physical Therapy Treatment Patient Details Name: MARTENA EMANUELE MRN: [id number] DOB: November 07, 1970 Today's Date: 04/24/2012 Time: 4098-1191 PT Time Calculation (min): 29 min  PT Assessment / Plan / Recommendation Comments on Treatment Session  Pt's sig other present and states his son will assist with stairs in w/c and that he worked for years as International aid/development worker and is familiar with assisting pt's wtih wallkers and in [facility]    Follow Up Recommendations  Home health PT     Does the patient have the potential to tolerate intense rehabilitation     Barriers to Discharge        Equipment Recommendations  Other (comment) (3n1)    Recommendations for Other Services    Frequency 7X/week   Plan Discharge plan remains appropriate    Precautions / Restrictions Precautions Precautions: Fall Restrictions Weight Bearing Restrictions: Yes RLE Weight Bearing: Non weight bearing   Pertinent Vitals/Pain     Mobility  Bed Mobility Bed Mobility: Supine to Sit;Sit to Supine Supine to Sit: 5: Supervision Sit to Supine: 5: Supervision Transfers Transfers: Sit to Stand;Stand to Sit Sit to Stand: 4: Min assist Stand to Sit: 4: Min assist Details for Transfer Assistance: cues for LE management and use of UEs to self assist Ambulation/Gait Ambulation/Gait Assistance: 4: Min assist Ambulation Distance (Feet): 35 Feet Assistive device: Rolling walker Ambulation/Gait Assistance Details: cues for posture, position from RW, sequence and saftey awareness Gait Pattern: Step-to pattern General Gait Details: Pt demonstrating moderate general instability with gait - states has poor balance prior to this and utilized w/c to go out of house Stairs: No    Exercises     PT Diagnosis: Difficulty walking  PT Problem List: Decreased strength;Decreased range of motion;Decreased mobility;Decreased activity tolerance;Decreased knowledge of use of DME;Decreased safety awareness;Pain;Decreased balance PT  Treatment Interventions: DME instruction;Gait training;Stair training;Functional mobility training;Therapeutic activities;Therapeutic exercise;Patient/family education   PT Goals Acute Rehab PT Goals PT Goal Formulation: With patient Time For Goal Achievement: 04/26/12 Potential to Achieve Goals: Good Pt will go Sit to Stand: with supervision PT Goal: Sit to Stand - Progress: Progressing toward goal Pt will go Stand to Sit: with supervision PT Goal: Stand to Sit - Progress: Progressing toward goal Pt will Ambulate: 16 - 50 feet;with supervision;with rolling walker PT Goal: Ambulate - Progress: Progressing toward goal  Visit Information  Last PT Received On: 04/24/12 Assistance Needed: +1    Subjective Data  Subjective: I only walked in the house.  Otherwise I used the w/c because of my balance and breathing.  I broke my ankle 2 weeks ago and my bf was carrying me around because I couldn't walk Patient Stated Goal: Resume previous lifestyle   Cognition  Overall Cognitive Status: Appears within functional limits for tasks assessed/performed Arousal/Alertness: Awake/alert Orientation Level: Appears intact for tasks assessed Behavior During Session: [facility] for tasks performed    Balance     End of Session PT - End of Session Equipment Utilized During Treatment: Gait belt Activity Tolerance: Patient tolerated treatment well Patient left: in chair;with call bell/phone within reach;with family/visitor present Nurse Communication: Mobility status   GP Functional Assessment Tool Used: clinical judgement Functional Limitation: Mobility: Walking and moving around Mobility: Walking and Moving Around Current Status [phone]): At least 40 percent but less than 60 percent impaired, limited or restricted Mobility: Walking and Moving Around Goal Status [phone]): At least 20 percent but less than 40 percent impaired, limited or restricted Mobility: Walking and Moving Around Discharge Status [phone]):  At  least 20 percent but less than 40 percent impaired, limited or restricted   Delta Deshmukh 04/24/2012, 1:43 PM

## 2012-04-24 NOTE — Discharge Summary (Signed)
Physician Discharge Summary  Patient ID: [name] MRN: [id number] DOB/AGE: 39/12/1970 39 y.o.  Admit date: 04/23/2012 Discharge date: 04/24/2012  Admission Diagnoses: Bimalleolar ankle fracture  Discharge Diagnoses: Same Active Problems:  * No active hospital problems. *    Discharged Condition: good  Hospital Course: Tolerated well. No complications  Consults: None  Significant Diagnostic Studies: Elevated glucose. Diet controlled  Treatments: surgery: ORIF ankle  Discharge Exam: Blood pressure 102/62, pulse 62, temperature 97.6 F (36.4 C), temperature source Oral, resp. rate 16, height 5' (1.524 m), weight 38.8 kg (85 lb 8.6 oz), last menstrual period 04/01/2012, SpO2 98.00%. NVI. compartments soft  Disposition: 01-Home or Self Care     Medication List     As of 04/24/2012  6:54 AM    TAKE these medications         HYDROcodone-acetaminophen 5-325 MG per tablet   Commonly known as: NORCO/VICODIN   Take 1-2 tablets by mouth every 6 (six) hours as needed for pain.      ASK your doctor about these medications         albuterol (2.5 MG/3ML) 0.083% nebulizer solution   Commonly known as: PROVENTIL   Take 2.5 mg by nebulization every 4 (four) hours as needed.      VENTOLIN HFA 108 (90 BASE) MCG/ACT inhaler   Generic drug: albuterol   Inhale 2 puffs into the lungs every 4 (four) hours as needed.      budesonide-formoterol 160-4.5 MCG/ACT inhaler   Commonly known as: SYMBICORT   Inhale 2 puffs into the lungs 2 (two) times daily.      HYDROcodone-acetaminophen 5-325 MG per tablet   Commonly known as: NORCO/VICODIN   Take 2 tablets by mouth every 4 (four) hours as needed for pain.      tiotropium 18 MCG inhalation capsule   Commonly known as: SPIRIVA   Place 18 mcg into inhaler and inhale daily.           Follow-up Information    Follow up with Lavada Langsam C, MD. In 2 weeks.   Contact information:   [address] [place]  [place] [address] [phone]          Signed: Javier Docker 04/24/2012, 6:54 AM

## 2012-04-24 NOTE — Progress Notes (Signed)
Subjective: 1 Day Post-Op Procedure(s) (LRB): OPEN REDUCTION INTERNAL FIXATION (ORIF) ANKLE FRACTURE (Left) Patient reports pain as 2 on 0-10 scale.    Objective: Vital signs in last 24 hours: Temp:  [97.6 F (36.4 C)-98 F (36.7 C)] 97.6 F (36.4 C) (12/24 0510) Pulse Rate:  [52-89] 62  (12/24 0510) Resp:  [12-18] 16  (12/24 0510) BP: (97-124)/(62-81) 102/62 mmHg (12/24 0510) SpO2:  [92 %-100 %] 98 % (12/24 0510) Weight:  [38.8 kg (85 lb 8.6 oz)] 38.8 kg (85 lb 8.6 oz) (12/23 1307)  Intake/Output from previous day: 12/23 0701 - 12/24 0700 In: 2107.5 [P.O.:840; I.V.:1267.5] Out: 1675 [Urine:1600; Blood:75] Intake/Output this shift: Total I/O In: 1357.5 [P.O.:840; I.V.:517.5] Out: 1600 [Urine:1600]   Basename 04/23/12 1329  HGB 16.3*    Basename 04/23/12 1329  WBC 4.5  RBC 4.60  HCT 45.5  PLT 152    Basename 04/24/12 0431 04/23/12 1329  NA 132* 134*  K 3.9 4.4  CL 94* 93*  CO2 31 31  BUN 4* 4*  CREATININE 0.33* 0.36*  GLUCOSE 147* 92  CALCIUM 9.3 9.9   No results found for this basename: LABPT:2,INR:2 in the last 72 hours  Neurologically intact Dorsiflexion/Plantar flexion intact Compartment soft  Assessment/Plan: 1 Day Post-Op Procedure(s) (LRB): OPEN REDUCTION INTERNAL FIXATION (ORIF) ANKLE FRACTURE (Left) Advance diet Discharge home with home health D/C instructions given Start ASA at home 325mg  QD with meal. Reports no history of PUD. NWB . See Dr. Shelle Iron in 10-14days. Has pain meds at home. Bivalve cast  Cristin Penaflor C 04/24/2012, 6:50 AM

## 2012-04-24 NOTE — Op Note (Signed)
[name], [name]            ACCOUNT NO.:  [id number]  MEDICAL RECORD NO.:  [id number]  LOCATION:  1608                         FACILITY:  [facility]  PHYSICIAN:  Jene Every, M.D.    DATE OF BIRTH:  October 30, 1970  DATE OF PROCEDURE: DATE OF DISCHARGE:                              OPERATIVE REPORT   PREOPERATIVE DIAGNOSIS:  Bimalleolar ankle fracture.  POSTOPERATIVE DIAGNOSIS:  Bimalleolar ankle fracture.  PROCEDURE PERFORMED:  ORIF ankle fracture, bimalleolar.  ANESTHESIA:  General.  ASSISTANT:  Lanna Poche, PA.  HISTORY:  A 41 year old, fracture widening on the mortise fibula and distal tibia indicated for ORIF.  Risks and benefits discussed including bleeding, infection, damage to neurovascular, DVT, PE, anesthetic complications, nonunion.  The patient has a history of diabetes.  The patient had a limited weight 85 pounds.  We discussed ORIF.  TECHNIQUE:  With the patient in supine position, after induction of adequate general anesthesia, 2 g of Kefzol and 600 clindamycin due to poor dentition, the left lower extremity was prepped and draped in usual sterile fashion.  Then, I used a tourniquet.  Incision was made over the fibula.  Subcutaneous tissue was essentially absent.  The lateral aspect of the fibular fracture was reduced.  We used extraperiosteal plating with the fibular contoured to a third tubular plate, contoured and placed a cancellous screw distally, bicortical screws above and below the fracture site.  The most proximal screw hole, however, did not provide adequate fixation, therefore that was removed.  This was drilled, depth gauge utilized, insertion of the screw, anatomic reduction in the mortise was intact.  Turned attention immediately. Incision was made over the medial malleolus.  Down the fracture site was cleaned, evacuated, and reduced the small fragment that we were able to engage with a K-wire and advanced into the tibia reducing it.  It  is only would except 1 screw, we placed a washer over that because of the patient's extreme osteoporosis noted.  We placed a 40 partially threaded screw, reduced it, held it fairly well.  We then irrigated both wounds. The AP and lateral plane, anatomic reduction was noted.  Closed the subcu with 2-0 Vicryl simple sutures.  Skin was reapproximated with staples.  Again, care was taken to avoid the neurovascular structures. There was no active bleeding.  Electrocautery was utilized.  In the AP and lateral plane, we had stress radiographs which were anatomic.  No widening of the mortise.  Fibula was intact.  Syndesmosis was intact. Sterile dressing was applied. Placed in a short-leg cast, well-molded.  Extubated without difficulty and transported to the recovery in satisfactory condition.  The patient tolerated the procedure well.  No complications.  Minimal blood loss.     Jene Every, M.D.     [name]  D:  04/23/2012  T:  04/24/2012  Job:  [id number]

## 2012-04-26 NOTE — Anesthesia Postprocedure Evaluation (Signed)
  Anesthesia Post-op Note  Patient: [name]  Procedure(s) Performed: Procedure(s) (LRB): OPEN REDUCTION INTERNAL FIXATION (ORIF) ANKLE FRACTURE (Left)  Patient Location: PACU  Anesthesia Type: Spinal  Level of Consciousness: awake and alert   Airway and Oxygen Therapy: Patient Spontanous Breathing  Post-op Pain: mild  Post-op Assessment: Post-op Vital signs reviewed, Patient's Cardiovascular Status Stable, Respiratory Function Stable, Patent Airway and No signs of Nausea or vomiting  Last Vitals:  Filed Vitals:   04/24/12 1030  BP: 108/73  Pulse: 61  Temp: 36.5 C  Resp: 16    Post-op Vital Signs: stable   Complications: No apparent anesthesia complications

## 2012-04-27 ENCOUNTER — Encounter (HOSPITAL_COMMUNITY): Payer: Self-pay | Admitting: Specialist

## 2012-05-25 ENCOUNTER — Telehealth: Payer: Self-pay | Admitting: Pulmonary Disease

## 2012-05-28 NOTE — Telephone Encounter (Signed)
After looking back, the last O2 sats on room air was recorded on 08/02/11 and [name] states this is too old. Medicaid is needing new sats for pt to keep oxygen. Pt will see CY on 06/19/12 and we can do this at that time.

## 2012-05-28 NOTE — Telephone Encounter (Signed)
Leah returned triage's call & can be reached [phone].  Stated that a verbal on pt's qualifying sats done on 04/09/12 would be ok, or it can be faxed to [phone].  Tacey Ruiz states this needs to include the date, what the sat is & that this is on room air.  Antionette Fairy

## 2012-05-28 NOTE — Telephone Encounter (Signed)
LMTCB  For Leah with [employer]. Pt last qualifying sats were done on 04/09/12. Do we need to repeat this?

## 2012-06-07 ENCOUNTER — Other Ambulatory Visit (HOSPITAL_COMMUNITY): Payer: Self-pay | Admitting: Internal Medicine

## 2012-06-07 ENCOUNTER — Ambulatory Visit (HOSPITAL_COMMUNITY)
Admission: RE | Admit: 2012-06-07 | Discharge: 2012-06-07 | Disposition: A | Discharge status: Home / Self Care | Payer: Medicaid Other | Source: Ambulatory Visit | Attending: Internal Medicine | Admitting: Internal Medicine

## 2012-06-07 DIAGNOSIS — J209 Acute bronchitis, unspecified: Secondary | ICD-10-CM

## 2012-06-07 DIAGNOSIS — J449 Chronic obstructive pulmonary disease, unspecified: Secondary | ICD-10-CM | POA: Insufficient documentation

## 2012-06-07 DIAGNOSIS — J4489 Other specified chronic obstructive pulmonary disease: Secondary | ICD-10-CM | POA: Insufficient documentation

## 2012-06-07 DIAGNOSIS — M4120 Other idiopathic scoliosis, site unspecified: Secondary | ICD-10-CM | POA: Insufficient documentation

## 2012-06-12 ENCOUNTER — Ambulatory Visit: Payer: Medicaid Other | Attending: Specialist | Admitting: Physical Therapy

## 2012-06-15 ENCOUNTER — Emergency Department (HOSPITAL_COMMUNITY): Payer: Medicaid Other

## 2012-06-15 ENCOUNTER — Encounter (HOSPITAL_COMMUNITY): Payer: Self-pay | Admitting: Emergency Medicine

## 2012-06-15 ENCOUNTER — Inpatient Hospital Stay (HOSPITAL_COMMUNITY)
Admission: EM | Admit: 2012-06-15 | Discharge: 2012-06-21 | DRG: 495 | Disposition: A | Discharge status: Home Health | Payer: Medicaid Other | Attending: Internal Medicine | Admitting: Internal Medicine

## 2012-06-15 DIAGNOSIS — R7309 Other abnormal glucose: Secondary | ICD-10-CM | POA: Diagnosis present

## 2012-06-15 DIAGNOSIS — Z681 Body mass index (BMI) 19 or less, adult: Secondary | ICD-10-CM

## 2012-06-15 DIAGNOSIS — Z22322 Carrier or suspected carrier of Methicillin resistant Staphylococcus aureus: Secondary | ICD-10-CM

## 2012-06-15 DIAGNOSIS — L0291 Cutaneous abscess, unspecified: Secondary | ICD-10-CM

## 2012-06-15 DIAGNOSIS — T50905A Adverse effect of unspecified drugs, medicaments and biological substances, initial encounter: Secondary | ICD-10-CM

## 2012-06-15 DIAGNOSIS — Z79899 Other long term (current) drug therapy: Secondary | ICD-10-CM

## 2012-06-15 DIAGNOSIS — E871 Hypo-osmolality and hyponatremia: Secondary | ICD-10-CM

## 2012-06-15 DIAGNOSIS — T847XXA Infection and inflammatory reaction due to other internal orthopedic prosthetic devices, implants and grafts, initial encounter: Secondary | ICD-10-CM

## 2012-06-15 DIAGNOSIS — J4489 Other specified chronic obstructive pulmonary disease: Secondary | ICD-10-CM

## 2012-06-15 DIAGNOSIS — J449 Chronic obstructive pulmonary disease, unspecified: Secondary | ICD-10-CM | POA: Diagnosis present

## 2012-06-15 DIAGNOSIS — A4902 Methicillin resistant Staphylococcus aureus infection, unspecified site: Secondary | ICD-10-CM | POA: Diagnosis present

## 2012-06-15 DIAGNOSIS — Y831 Surgical operation with implant of artificial internal device as the cause of abnormal reaction of the patient, or of later complication, without mention of misadventure at the time of the procedure: Secondary | ICD-10-CM | POA: Diagnosis present

## 2012-06-15 DIAGNOSIS — G1111 Friedreich ataxia: Secondary | ICD-10-CM

## 2012-06-15 DIAGNOSIS — E46 Unspecified protein-calorie malnutrition: Secondary | ICD-10-CM | POA: Diagnosis present

## 2012-06-15 DIAGNOSIS — E876 Hypokalemia: Secondary | ICD-10-CM | POA: Diagnosis not present

## 2012-06-15 DIAGNOSIS — R7401 Elevation of levels of liver transaminase levels: Secondary | ICD-10-CM | POA: Diagnosis present

## 2012-06-15 DIAGNOSIS — T40605A Adverse effect of unspecified narcotics, initial encounter: Secondary | ICD-10-CM | POA: Diagnosis not present

## 2012-06-15 DIAGNOSIS — K279 Peptic ulcer, site unspecified, unspecified as acute or chronic, without hemorrhage or perforation: Secondary | ICD-10-CM

## 2012-06-15 DIAGNOSIS — F1010 Alcohol abuse, uncomplicated: Secondary | ICD-10-CM | POA: Diagnosis present

## 2012-06-15 DIAGNOSIS — Z87891 Personal history of nicotine dependence: Secondary | ICD-10-CM

## 2012-06-15 DIAGNOSIS — T40601A Poisoning by unspecified narcotics, accidental (unintentional), initial encounter: Secondary | ICD-10-CM

## 2012-06-15 DIAGNOSIS — R7402 Elevation of levels of lactic acid dehydrogenase (LDH): Secondary | ICD-10-CM | POA: Diagnosis present

## 2012-06-15 DIAGNOSIS — Z993 Dependence on wheelchair: Secondary | ICD-10-CM

## 2012-06-15 DIAGNOSIS — L0390 Cellulitis, unspecified: Secondary | ICD-10-CM

## 2012-06-15 DIAGNOSIS — T400X1A Poisoning by opium, accidental (unintentional), initial encounter: Secondary | ICD-10-CM

## 2012-06-15 DIAGNOSIS — J9600 Acute respiratory failure, unspecified whether with hypoxia or hypercapnia: Secondary | ICD-10-CM | POA: Diagnosis not present

## 2012-06-15 DIAGNOSIS — T8131XA Disruption of external operation (surgical) wound, not elsewhere classified, initial encounter: Secondary | ICD-10-CM

## 2012-06-15 DIAGNOSIS — M009 Pyogenic arthritis, unspecified: Secondary | ICD-10-CM

## 2012-06-15 DIAGNOSIS — L03116 Cellulitis of left lower limb: Secondary | ICD-10-CM

## 2012-06-15 DIAGNOSIS — R748 Abnormal levels of other serum enzymes: Secondary | ICD-10-CM

## 2012-06-15 DIAGNOSIS — S62319A Displaced fracture of base of unspecified metacarpal bone, initial encounter for closed fracture: Secondary | ICD-10-CM

## 2012-06-15 DIAGNOSIS — Z9981 Dependence on supplemental oxygen: Secondary | ICD-10-CM

## 2012-06-15 DIAGNOSIS — R8789 Other abnormal findings in specimens from female genital organs: Secondary | ICD-10-CM

## 2012-06-15 LAB — COMPREHENSIVE METABOLIC PANEL
CO2: 31 mEq/L (ref 19–32)
Calcium: 8.2 mg/dL — ABNORMAL LOW (ref 8.4–10.5)
Creatinine, Ser: 0.31 mg/dL — ABNORMAL LOW (ref 0.50–1.10)
GFR calc Af Amer: >90 mL/min (ref >90)
GFR calc non Af Amer: >90 mL/min (ref >90)
Glucose, Bld: 90 mg/dL (ref 70–99)

## 2012-06-15 LAB — CBC WITH DIFFERENTIAL/PLATELET
Basophils Absolute: 0 10*3/uL (ref 0.0–0.1)
Basophils Relative: 1 % (ref 0–1)
Eosinophils Absolute: 0.1 10*3/uL (ref 0.0–0.7)
Eosinophils Relative: 1 % (ref 0–5)
HCT: 47.4 % — ABNORMAL HIGH (ref 36.0–46.0)
MCHC: 34.4 g/dL (ref 30.0–36.0)
MCV: 101.3 fL — ABNORMAL HIGH (ref 78.0–100.0)
Monocytes Absolute: 0.8 10*3/uL (ref 0.1–1.0)
Neutro Abs: 2.2 10*3/uL (ref 1.7–7.7)
RDW: 15.2 % (ref 11.5–15.5)

## 2012-06-15 MED ORDER — SODIUM CHLORIDE 0.9 % IV BOLUS (SEPSIS)
1000.0000 mL | Freq: Once | INTRAVENOUS | Status: AC
  Administered 2012-06-15: 1000 mL via INTRAVENOUS

## 2012-06-15 MED ORDER — SODIUM CHLORIDE 0.9 % IV SOLN
Freq: Once | INTRAVENOUS | Status: AC
  Administered 2012-06-15: 21:00:00 via INTRAVENOUS

## 2012-06-15 MED ORDER — ONDANSETRON HCL 4 MG/2ML IJ SOLN
4.0000 mg | Freq: Once | INTRAMUSCULAR | Status: AC
  Administered 2012-06-15: 4 mg via INTRAVENOUS
  Filled 2012-06-15: qty 2

## 2012-06-15 MED ORDER — IOHEXOL 300 MG/ML  SOLN
100.0000 mL | Freq: Once | INTRAMUSCULAR | Status: AC | PRN
  Administered 2012-06-15: 80 mL via INTRAVENOUS

## 2012-06-15 MED ORDER — HYDROMORPHONE HCL PF 1 MG/ML IJ SOLN
1.0000 mg | Freq: Once | INTRAMUSCULAR | Status: AC
  Administered 2012-06-15: 1 mg via INTRAVENOUS
  Filled 2012-06-15: qty 1

## 2012-06-15 MED ORDER — VANCOMYCIN HCL 10 G IV SOLR
1000.0000 mg | Freq: Once | INTRAVENOUS | Status: DC
  Filled 2012-06-15: qty 1000

## 2012-06-15 MED ORDER — IPRATROPIUM BROMIDE 0.02 % IN SOLN
0.5000 mg | Freq: Once | RESPIRATORY_TRACT | Status: AC
  Administered 2012-06-15: 0.5 mg via RESPIRATORY_TRACT
  Filled 2012-06-15: qty 2.5

## 2012-06-15 MED ORDER — NALOXONE HCL 0.4 MG/ML IJ SOLN
INTRAMUSCULAR | Status: AC
  Administered 2012-06-15: 0.8 mg via INTRAVENOUS
  Filled 2012-06-15: qty 3

## 2012-06-15 MED ORDER — VANCOMYCIN HCL IN DEXTROSE 1-5 GM/200ML-% IV SOLN
1000.0000 mg | Freq: Once | INTRAVENOUS | Status: AC
  Administered 2012-06-15: 1000 mg via INTRAVENOUS
  Filled 2012-06-15: qty 200

## 2012-06-15 MED ORDER — ALBUTEROL SULFATE (5 MG/ML) 0.5% IN NEBU
2.5000 mg | INHALATION_SOLUTION | Freq: Once | RESPIRATORY_TRACT | Status: AC
  Administered 2012-06-15: 2.5 mg via RESPIRATORY_TRACT
  Filled 2012-06-15: qty 0.5

## 2012-06-15 NOTE — ED Notes (Signed)
Pt c/o pain and bleeding to L ankle, pt states she had ORIF Dec 23 with hardware. Blood noted on bandage. Pt appears pale, on 2 L cont 02.

## 2012-06-15 NOTE — ED Provider Notes (Addendum)
History     CSN: [id number]  Arrival date & time 06/15/12  1842   First MD Initiated Contact with Patient 06/15/12 1930      Chief Complaint  Patient presents with  . Wound Check    (Consider location/radiation/quality/duration/timing/severity/associated sxs/prior treatment) Patient is a 42 y.o. female presenting with wound check. The history is provided by the patient.  Wound Check  She had open reduction and internal fixation of a left ankle fracture on December 23. 3 days ago, she had 2 screws removed because they were working their way through the skin. Today, she started having bleeding from that site. She was scheduled to see her physician today, but the appointment was canceled because of weather. She is complaining of severe pain in that ankle and she rates at 10/10. She denies fever, chills, sweats. She is taking an antibiotic but she does not know which one it is.  Past Medical History  Diagnosis Date  . Arthritis   . COPD (chronic obstructive pulmonary disease)   . Respiratory failure, acute   . Diabetes mellitus     borderline  . Hx of toe surgery     X 2  . Ankle fracture     LEFT  . On supplemental oxygen therapy     2 L continuous    Past Surgical History  Procedure Laterality Date  . Cholecystectomy    . Knee arthroscopy      bilateral  . Toe surgery      x2  . Orif ankle fracture  04/23/2012    Procedure: OPEN REDUCTION INTERNAL FIXATION (ORIF) ANKLE FRACTURE;  Surgeon: Javier Docker, MD;  Location: [facility];  Service: Orthopedics;  Laterality: Left;    Family History  Problem Relation Age of Onset  . Diabetes    . Asthma      History  Substance Use Topics  . Smoking status: Former Smoker -- 0.50 packs/day for 10 years    Quit date: 01/30/2010  . Smokeless tobacco: Never Used  . Alcohol Use: No    OB History   Grav Para Term Preterm Abortions TAB SAB Ect Mult Living                  Review of Systems  All other systems reviewed and  are negative.    Allergies  Review of patient's allergies indicates no known allergies.  Home Medications   Current Outpatient Rx  Name  Route  Sig  Dispense  Refill  . albuterol (PROVENTIL) (2.5 MG/3ML) 0.083% nebulizer solution   Nebulization   Take 2.5 mg by nebulization every 4 (four) hours as needed.           Marland Kitchen albuterol (VENTOLIN HFA) 108 (90 BASE) MCG/ACT inhaler   Inhalation   Inhale 2 puffs into the lungs every 4 (four) hours as needed.           . budesonide-formoterol (SYMBICORT) 160-4.5 MCG/ACT inhaler   Inhalation   Inhale 2 puffs into the lungs 2 (two) times daily.          Marland Kitchen HYDROcodone-acetaminophen (NORCO) 5-325 MG per tablet   Oral   Take 1-2 tablets by mouth every 6 (six) hours as needed for pain.   30 tablet   0   . HYDROcodone-acetaminophen (NORCO/VICODIN) 5-325 MG per tablet   Oral   Take 2 tablets by mouth every 4 (four) hours as needed for pain.   20 tablet   0   . tiotropium (  SPIRIVA) 18 MCG inhalation capsule   Inhalation   Place 18 mcg into inhaler and inhale daily.           BP 125/72  Pulse 105  Temp(Src) 99.3 F (37.4 C) (Oral)  Resp 20  SpO2 86%  Physical Exam  Nursing note and vitals reviewed.  42 year old female, who appears to be in pain, but is in no acute distress. Vital signs are significant for tachycardia with heart rate 105. Oxygen saturation is 86%, which is hypoxic. Head is normocephalic and atraumatic. PERRLA, EOMI. Oropharynx is clear. Neck is nontender and supple without adenopathy or JVD. Back is nontender and there is no CVA tenderness. Lungs are clear without rales, wheezes, or rhonchi. Chest is nontender. Heart has regular rate and rhythm without murmur. Abdomen is soft, flat, nontender without masses or hepatosplenomegaly and peristalsis is normoactive. Extremities: There is a dressing present over the left ankle with some purulent drainage coming superior to the dressing. Dressing is removed and any  pallor is noted. There are 2 holes in the skin which is apparently where the screws had been removed and there is no obvious drainage from that site. There is a break in the skin which was just superior to the head dressing and this is where the drainage is from. Skin has intense erythema at this point. Cultures were obtained and there was a little resistance to putting the culture swab and through the skin. Skin is warm and  dry without other rash. Neurologic: Mental status is normal, cranial nerves are intact, there are no motor or sensory deficits.  ED Course  Procedures (including critical care time)  Results for orders placed during the hospital encounter of 06/15/12  CBC WITH DIFFERENTIAL      Result Value Range   WBC 3.9 (*) 4.0 - 10.5 K/uL   RBC 4.68  3.87 - 5.11 MIL/uL   Hemoglobin 16.3 (*) 12.0 - 15.0 g/dL   HCT 16.1 (*) 09.6 - 04.5 %   MCV 101.3 (*) 78.0 - 100.0 fL   MCH 34.8 (*) 26.0 - 34.0 pg   MCHC 34.4  30.0 - 36.0 g/dL   RDW 40.9  81.1 - 91.4 %   Platelets 68 (*) 150 - 400 K/uL   Neutrophils Relative 58  43 - 77 %   Neutro Abs 2.2  1.7 - 7.7 K/uL   Lymphocytes Relative 21  12 - 46 %   Lymphs Abs 0.8  0.7 - 4.0 K/uL   Monocytes Relative 19 (*) 3 - 12 %   Monocytes Absolute 0.8  0.1 - 1.0 K/uL   Eosinophils Relative 1  0 - 5 %   Eosinophils Absolute 0.1  0.0 - 0.7 K/uL   Basophils Relative 1  0 - 1 %   Basophils Absolute 0.0  0.0 - 0.1 K/uL   WBC Morphology ATYPICAL LYMPHOCYTES     RBC Morphology POLYCHROMASIA PRESENT     Smear Review PLATELET COUNT CONFIRMED BY SMEAR    COMPREHENSIVE METABOLIC PANEL      Result Value Range   Sodium 130 (*) 135 - 145 mEq/L   Potassium 5.1  3.5 - 5.1 mEq/L   Chloride 88 (*) 96 - 112 mEq/L   CO2 31  19 - 32 mEq/L   Glucose, Bld 90  70 - 99 mg/dL   BUN 4 (*) 6 - 23 mg/dL   Creatinine, Ser 7.82 (*) 0.50 - 1.10 mg/dL   Calcium 8.2 (*) 8.4 - 10.5  mg/dL   Total Protein 6.5  6.0 - 8.3 g/dL   Albumin 2.6 (*) 3.5 - 5.2 g/dL   AST 161  (*) 0 - 37 U/L   ALT 120 (*) 0 - 35 U/L   Alkaline Phosphatase 261 (*) 39 - 117 U/L   Total Bilirubin 0.8  0.3 - 1.2 mg/dL   GFR calc non Af Amer >90  >90 mL/min   GFR calc Af Amer >90  >90 mL/min  SEDIMENTATION RATE      Result Value Range   Sed Rate 2  0 - 22 mm/hr  LACTIC ACID, PLASMA      Result Value Range   Lactic Acid, Venous 2.0  0.5 - 2.2 mmol/L   Ct Tibia Fibula Left W Contrast  06/15/2012  *RADIOLOGY REPORT*  Clinical Data: Evaluate for infection; status post internal fixation of left ankle fracture.  CT OF THE LEFT TIBIA AND FIBULA WITH CONTRAST  Contrast: 80mL OMNIPAQUE IOHEXOL 300 MG/ML  SOLN  Comparison: Fluoroscopic C-arm images of the left ankle performed 04/23/2012  Findings: Evaluation for osteomyelitis is significantly limited on CT; no definite significant osseous erosions are seen, though this is difficult to differentiate from postoperative change.  MRI would likely also be suboptimal, given underlying hardware, unless areas of concern are situated distal to the hardware.  No abscess is identified.  Soft tissue swelling is noted along the left lower leg and ankle, with mild soft tissue swelling along the dorsum of the midfoot.  There is a focal soft tissue defect overlying the distal aspect of the fibular plate, with minimal air tracking superiorly along the fibular plate.  This appears to expose the underlying hardware.  The visualized vasculature is grossly unremarkable in appearance. The visualized flexor extensor tendons are grossly unremarkable. Mild soft tissue inflammation is noted about the peroneus longus and brevis.  A small knee joint effusion is noted.  The knee joint is otherwise grossly unremarkable in appearance.  An ankle joint effusion is seen.  The fracture lines through the distal fibula and medial malleolus are still seen, though transfixed by hardware as expected.  There is no evidence of loosening or hardware fracture.  There is diffuse osteopenia of  visualized osseous structures.  IMPRESSION:  1.  No significant osseous erosions are seen, though evaluation for osteomyelitis is significantly limited on CT.  MRI would likely also be suboptimal, given underlying hardware, unless areas of concern are situated distal to the hardware. 2.  No evidence of abscess. 3.  Focal soft tissue defect overlying the distal aspect of the fibular plate, with minimal air tracking superiorly along the fibular plate.  The soft tissue defect appears to expose the underlying hardware. 4.  No evidence of loosening with respect to the tibial and fibular hardware; diffuse osteopenia of visualized osseous structures. Fracture lines are still evident.  5.  Ankle joint effusion seen; small knee joint effusion noted. 6.  Suggestion of mild soft tissue inflammation about the peroneus longus and brevis. 7.  Soft tissue swelling along the left lower leg and ankle, with mild soft tissue swelling along the dorsum of the midfoot.   Original Report Authenticated By: Tonia Ghent, M.D.       1. Cellulitis of left leg   2. Medication reaction   3. Hyponatremia   4. Elevated liver enzymes    CRITICAL CARE Performed by: WRUEA,[email or web address]   Total critical care time: 45 minutes  Critical care time was exclusive of separately billable procedures and treating  other patients.  Critical care was necessary to treat or prevent imminent or life-threatening deterioration.  Critical care was time spent personally by me on the following activities: development of treatment plan with patient and/or surrogate as well as nursing, discussions with consultants, evaluation of patient's response to treatment, examination of patient, obtaining history from patient or surrogate, ordering and performing treatments and interventions, ordering and review of laboratory studies, ordering and review of radiographic studies, pulse oximetry and re-evaluation of patient's condition.    MDM  Wound infection. It is  not clear how far the infection is spreading. CT will be obtained to help delineate that. She'll be started on vancomycin empirically. Old records are reviewed and she was seen in the ED in December for a bimalleolar ankle fracture with subsequent open reduction and internal fixation.  She was given hydromorphone for pain. Approximately one hour and 40 minutes later, patient had an episode of hypoventilation with oxygen saturations dropping to 55%. She was supported with bag-valve-mask ventilation and given naloxone intravenously with prompt restoration of normal level of consciousness and normal respirations and normal oxygen saturations. She was moved to the resuscitation room for closer observation. Case has been discussed with Dr. Sharyon Medicus of [facility] who agrees to admit the patient. Cause of elevated liver enzymes is unclear. However, she will need to be watched for an extended period of time because of hydromorphone is metabolized in the liver and half-life may be increased with underlying liver problems.  Dione Booze, MD 06/15/12 2300  Dione Booze, MD 06/15/12 2338  Case is discussed with Dr. Shon Baton of [facility] orthopedics. Dr. Shelle Iron will see the patient in consultation tomorrow. In the meantime, you requested a dry dressing be applied.  Dione Booze, MD 06/15/12 [phone]

## 2012-06-15 NOTE — ED Notes (Signed)
OZH:[id number]<HQ> Expected date:<BR> Expected time:<BR> Means of arrival:<BR> Comments:<BR> Rm 8

## 2012-06-15 NOTE — H&P (Signed)
[facility]                                                                                    Patient Demographics  [name], is a 42 y.o. female  CSN: [id number]  MRN: [id number]  DOB - [birth year]  Admit Date - 06/15/2012  Outpatient Primary MD for the patient is Dorrene German, MD   With History of -  Past Medical History  Diagnosis Date  . Arthritis   . COPD (chronic obstructive pulmonary disease)   . Respiratory failure, acute   . Diabetes mellitus     borderline  . Hx of toe surgery     X 2  . Ankle fracture     LEFT  . On supplemental oxygen therapy     2 L continuous      Past Surgical History  Procedure Laterality Date  . Cholecystectomy    . Knee arthroscopy      bilateral  . Toe surgery      x2  . Orif ankle fracture  04/23/2012    Procedure: OPEN REDUCTION INTERNAL FIXATION (ORIF) ANKLE FRACTURE;  Surgeon: Javier Docker, MD;  Location: [facility];  Service: Orthopedics;  Laterality: Left;    in for   Chief Complaint  Patient presents with  . Wound Check     HPI  [name]  is a 42 y.o. female, with a past medical history significant for left ankle malleolar fracture status post surgery with hardware placement on 04/23/2012 and removal of the screws last Tuesday presenting after missing an appointment with her position today due to the weather. The patient noted more bleeding from her ankle wound with redness and pain. No fever or chills. Dr. Shelle Iron from orthopedics was consulted and he will see the patient in a.m. Marland Kitchen  During her stay , patient received 1 mg of Dilaudid due to severe pain , after which she desated and she required Narcan given in the emergency room. Now the patient is more alert and awake and will be admitted for IV antibiotics, orthopedics consult, and monitoring after this episode.     Review of Systems    In addition to the HPI above,  No Fever-chills, No Headache, No changes with Vision or  hearing, No problems swallowing food or Liquids, No Chest pain, Cough or Shortness of Breath, No Abdominal pain, No Nausea or Vommitting, Bowel movements are regular, No Blood in stool or Urine, No dysuria, No new skin rashes or bruises,  No new weakness, tingling, numbness in any extremity, No recent weight gain or loss, No polyuria, polydypsia or polyphagia, No significant Mental Stressors.  A full 10 point Review of Systems was done, except as stated above, all other Review of Systems were negative.   Social History History  Substance Use Topics  . Smoking status: Former Smoker -- 0.50 packs/day for 10 years    Quit date: 01/30/2010  . Smokeless tobacco: Never Used  . Alcohol Use: No     Family History Family History  Problem Relation Age of Onset  . Diabetes    . Asthma       Prior to  Admission medications   Medication Sig Start Date End Date Taking? Authorizing Provider  albuterol (PROVENTIL) (2.5 MG/3ML) 0.083% nebulizer solution Take 2.5 mg by nebulization every 4 (four) hours as needed. For shortness of breath   Yes Historical Provider, MD  albuterol (VENTOLIN HFA) 108 (90 BASE) MCG/ACT inhaler Inhale 2 puffs into the lungs every 4 (four) hours as needed.     Yes Historical Provider, MD  budesonide-formoterol (SYMBICORT) 160-4.5 MCG/ACT inhaler Inhale 2 puffs into the lungs 2 (two) times daily.    Yes Historical Provider, MD  tiotropium (SPIRIVA) 18 MCG inhalation capsule Place 18 mcg into inhaler and inhale daily. 10/04/10 04/09/13 Yes Oretha Milch, MD  HYDROcodone-acetaminophen (NORCO) 5-325 MG per tablet Take 1-2 tablets by mouth every 6 (six) hours as needed for pain. 04/23/12   Dayna Barker. Bissell, PA-C    No Known Allergies  Physical Exam  Vitals  Blood pressure 130/88, pulse 108, temperature 99.3 F (37.4 C), temperature source Oral, resp. rate 20, height 5' (1.524 m), weight 39.463 kg (87 lb), SpO2 95.00%.   1. General white American female,  drowsy  2. Normal affect and insight, Not Suicidal or Homicidal, Awake Alert, Oriented X 3.  3. No F.N deficits, ALL C.Nerves Intact, Strength 5/5 all 4 extremities, Sensation intact all 4 extremities, Plantars down going.  4. Ears and Eyes appear puffy, Conjunctivae clear, PERRLA. Moist Oral Mucosa.  5. Supple Neck, No JVD, No cervical lymphadenopathy appriciated, No Carotid Bruits.  6. Symmetrical Chest wall movement, Good air movement bilaterally, mild rhonchi bilaterally.  7. RRR, No Gallops, Rubs or Murmurs, No Parasternal Heave.  8. Positive Bowel Sounds, Abdomen Soft, Non tender, No organomegaly appriciated,No rebound -guarding or rigidity.  9.  No Cyanosis, Normal Skin Turgor, No Skin Rash or Bruise.  10. Good muscle tone,  joints appear normal , no effusions, Normal ROM.  11. No Palpable Lymph Nodes in Neck or Axillae  12. Left ankle lateral open wound 10 cm in size with metal showing through the wound, redness and bloody discharge noted; right foot swelling noted but no wounds    Data Review  CBC  Recent Labs Lab 06/15/12 2007  WBC 3.9*  HGB 16.3*  HCT 47.4*  PLT 68*  MCV 101.3*  MCH 34.8*  MCHC 34.4  RDW 15.2  LYMPHSABS 0.8  MONOABS 0.8  EOSABS 0.1  BASOSABS 0.0   ------------------------------------------------------------------------------------------------------------------  Chemistries   Recent Labs Lab 06/15/12 2007  NA 130*  K 5.1  CL 88*  CO2 31  GLUCOSE 90  BUN 4*  CREATININE 0.31*  CALCIUM 8.2*  AST 191*  ALT 120*  ALKPHOS 261*  BILITOT 0.8     ---------------------------------------------------------------------------------------------------------------   ----------------------------------------------------------------------------------------------------------------   Imaging results:   Dg Chest 2 View  06/07/2012  *RADIOLOGY REPORT*  Clinical Data: Chronic bronchitis with wheezing  CHEST - 2 VIEW  Comparison: July 12, 2011.  Findings:  There is underlying emphysema.  There are bilateral nipple shadows.  There is no edema or consolidation.  The heart size and pulmonary vascularity normal.  No adenopathy.  There is thoracolumbar levoscoliosis.   IMPRESSION: Underlying emphysema.  No edema or consolidation.   Original Report Authenticated By: Bretta Bang, M.D.    Ct Tibia Fibula Left W Contrast  06/15/2012  *RADIOLOGY REPORT*  Clinical Data: Evaluate for infection; status post internal fixation of left ankle fracture.  CT OF THE LEFT TIBIA AND FIBULA WITH CONTRAST  Contrast: 80mL OMNIPAQUE IOHEXOL 300 MG/ML  SOLN  Comparison: Fluoroscopic C-arm images of the left ankle performed 04/23/2012  Findings: Evaluation for osteomyelitis is significantly limited on CT; no definite significant osseous erosions are seen, though this is difficult to differentiate from postoperative change.  MRI would likely also be suboptimal, given underlying hardware, unless areas of concern are situated distal to the hardware.  No abscess is identified.  Soft tissue swelling is noted along the left lower leg and ankle, with mild soft tissue swelling along the dorsum of the midfoot.  There is a focal soft tissue defect overlying the distal aspect of the fibular plate, with minimal air tracking superiorly along the fibular plate.  This appears to expose the underlying hardware.  The visualized vasculature is grossly unremarkable in appearance. The visualized flexor extensor tendons are grossly unremarkable. Mild soft tissue inflammation is noted about the peroneus longus and brevis.  A small knee joint effusion is noted.  The knee joint is otherwise grossly unremarkable in appearance.  An ankle joint effusion is seen.  The fracture lines through the distal fibula and medial malleolus are still seen, though transfixed by hardware as expected.  There is no evidence of loosening or hardware fracture.  There is diffuse osteopenia of visualized osseous  structures.  IMPRESSION:  1.  No significant osseous erosions are seen, though evaluation for osteomyelitis is significantly limited on CT.  MRI would likely also be suboptimal, given underlying hardware, unless areas of concern are situated distal to the hardware. 2.  No evidence of abscess. 3.  Focal soft tissue defect overlying the distal aspect of the fibular plate, with minimal air tracking superiorly along the fibular plate.  The soft tissue defect appears to expose the underlying hardware. 4.  No evidence of loosening with respect to the tibial and fibular hardware; diffuse osteopenia of visualized osseous structures. Fracture lines are still evident.  5.  Ankle joint effusion seen; small knee joint effusion noted. 6.  Suggestion of mild soft tissue inflammation about the peroneus longus and brevis. 7.  Soft tissue swelling along the left lower leg and ankle, with mild soft tissue swelling along the dorsum of the midfoot.   Original Report Authenticated By: Tonia Ghent, M.D.      Assessment & Plan    1. left ankle cellulitis with hardware infection, patient had hardware placed in December of last year by Dr. Shelle Iron . Her screws were removed on Tuesday. Patient noted redness and bleeding in the last few days. Start on IV Vanco and Zosyn, cultures ordered, wound care ordered. MRI is suboptimal due to hardware presence 2. Acute respiratory failure due to opiates, reversed on Narcan, patient doing better with pulse ox of around 95%. Will minimize her opiates. 3. Probable history of alcoholism although patient denies it. However her hyponatremia, transaminitis, and high MCV suggest it 4. Transaminitis, liver ultrasound and hepatitis profile ordered, 5. Hyponatremia, chronic noted in 2009 probably alcohol related 6. High MCV, probably alcohol related  Plan As mentioned above, start patient on IV antibiotics [employer] and Zosyn Local wound care Thiamine folate and Ativan Check ultrasound of  the liver and hepatitis profile Observe on telemetry for now due to the acute respiratory depression Follow labs in a.m.     DVT Prophylaxis Lovenox  AM Labs Ordered, also please review Full Orders  Family Communication: Admission, patients condition and plan of care including tests being ordered have been discussed with the patient. No family communication done  Code Status full  Disposition Plan: Home with home  health  Time spent in minutes : 42 minutes  Condition GUARDED

## 2012-06-15 NOTE — ED Notes (Signed)
Called to pt room at 2130 per Radiology Tech.  Stated pt was cyanotic on return from CT.  Pt found with respiratory suppression with sat of 50% and unresponsive.  MD notified.  Pt bagged and oxygenated prior to narcan being given.  Pt responded to narcan.  Sats returned to 95% on 4l per [place].  Pt transferred to Res A for continued monitoring.  Report given to [place], RN

## 2012-06-15 NOTE — ED Notes (Signed)
Patient states her hardware was removed Tuesday 06/12/12

## 2012-06-16 ENCOUNTER — Encounter (HOSPITAL_COMMUNITY)
Admission: EM | Disposition: A | Discharge status: Home Health | Payer: Self-pay | Source: Home / Self Care | Attending: Internal Medicine

## 2012-06-16 ENCOUNTER — Encounter (HOSPITAL_COMMUNITY): Payer: Self-pay | Admitting: Anesthesiology

## 2012-06-16 ENCOUNTER — Inpatient Hospital Stay (HOSPITAL_COMMUNITY): Payer: Medicaid Other

## 2012-06-16 ENCOUNTER — Inpatient Hospital Stay (HOSPITAL_COMMUNITY): Payer: Medicaid Other | Admitting: Anesthesiology

## 2012-06-16 ENCOUNTER — Encounter (HOSPITAL_COMMUNITY): Payer: Self-pay | Admitting: Oncology

## 2012-06-16 DIAGNOSIS — J449 Chronic obstructive pulmonary disease, unspecified: Secondary | ICD-10-CM

## 2012-06-16 DIAGNOSIS — L02419 Cutaneous abscess of limb, unspecified: Secondary | ICD-10-CM

## 2012-06-16 HISTORY — PX: APPLICATION OF WOUND VAC: SHX5189

## 2012-06-16 HISTORY — PX: HARDWARE REMOVAL: SHX979

## 2012-06-16 HISTORY — PX: INCISION AND DRAINAGE: SHX5863

## 2012-06-16 LAB — COMPREHENSIVE METABOLIC PANEL
AST: 188 U/L — ABNORMAL HIGH (ref 0–37)
Albumin: 2.3 g/dL — ABNORMAL LOW (ref 3.5–5.2)
BUN: 4 mg/dL — ABNORMAL LOW (ref 6–23)
Calcium: 7.9 mg/dL — ABNORMAL LOW (ref 8.4–10.5)
Chloride: 94 mEq/L — ABNORMAL LOW (ref 96–112)
Creatinine, Ser: 0.31 mg/dL — ABNORMAL LOW (ref 0.50–1.10)
Total Bilirubin: 0.9 mg/dL (ref 0.3–1.2)
Total Protein: 5.4 g/dL — ABNORMAL LOW (ref 6.0–8.3)

## 2012-06-16 LAB — CBC
HCT: 46.1 % — ABNORMAL HIGH (ref 36.0–46.0)
Hemoglobin: 15 g/dL (ref 12.0–15.0)
MCHC: 32.5 g/dL (ref 30.0–36.0)
RBC: 4.41 MIL/uL (ref 3.87–5.11)
WBC: 4 10*3/uL (ref 4.0–10.5)

## 2012-06-16 LAB — HEPATITIS PANEL, ACUTE
HCV Ab: NEGATIVE
Hep A IgM: NEGATIVE
Hep B C IgM: NEGATIVE
Hepatitis B Surface Ag: NEGATIVE

## 2012-06-16 LAB — MAGNESIUM: Magnesium: 1.7 mg/dL (ref 1.5–2.5)

## 2012-06-16 LAB — PHOSPHORUS: Phosphorus: 3.6 mg/dL (ref 2.3–4.6)

## 2012-06-16 SURGERY — REMOVAL, HARDWARE
Anesthesia: Spinal | Site: Ankle | Laterality: Left | Wound class: Dirty or Infected

## 2012-06-16 MED ORDER — ALBUTEROL SULFATE (5 MG/ML) 0.5% IN NEBU
2.5000 mg | INHALATION_SOLUTION | Freq: Four times a day (QID) | RESPIRATORY_TRACT | Status: DC
  Administered 2012-06-16 – 2012-06-20 (×18): 2.5 mg via RESPIRATORY_TRACT
  Filled 2012-06-16 (×17): qty 0.5

## 2012-06-16 MED ORDER — SODIUM CHLORIDE 0.9 % IV SOLN
INTRAVENOUS | Status: DC
  Administered 2012-06-16 (×2): via INTRAVENOUS

## 2012-06-16 MED ORDER — MIDAZOLAM HCL 5 MG/5ML IJ SOLN
INTRAMUSCULAR | Status: DC | PRN
  Administered 2012-06-16 (×2): 1 mg via INTRAVENOUS

## 2012-06-16 MED ORDER — LACTATED RINGERS IV SOLN
INTRAVENOUS | Status: DC | PRN
  Administered 2012-06-16: 14:00:00 via INTRAVENOUS

## 2012-06-16 MED ORDER — VANCOMYCIN HCL 1000 MG IV SOLR: 750.0000 mg | INTRAVENOUS | Status: DC

## 2012-06-16 MED ORDER — ACETAMINOPHEN 650 MG RE SUPP: 650.0000 mg | Freq: Four times a day (QID) | RECTAL | Status: DC | PRN

## 2012-06-16 MED ORDER — ONDANSETRON HCL 4 MG/2ML IJ SOLN
4.0000 mg | Freq: Four times a day (QID) | INTRAMUSCULAR | Status: DC | PRN
  Administered 2012-06-18: 4 mg via INTRAVENOUS
  Filled 2012-06-16: qty 2

## 2012-06-16 MED ORDER — VANCOMYCIN HCL 1000 MG IV SOLR
750.0000 mg | Freq: Two times a day (BID) | INTRAVENOUS | Status: DC
  Administered 2012-06-16 – 2012-06-18 (×5): 750 mg via INTRAVENOUS
  Filled 2012-06-16 (×5): qty 750

## 2012-06-16 MED ORDER — DEXTROSE-NACL 5-0.45 % IV SOLN
INTRAVENOUS | Status: DC
  Administered 2012-06-16: 01:00:00 via INTRAVENOUS

## 2012-06-16 MED ORDER — LORAZEPAM 2 MG/ML IJ SOLN: 0.5000 mg | Freq: Four times a day (QID) | INTRAMUSCULAR | Status: DC | PRN

## 2012-06-16 MED ORDER — TIOTROPIUM BROMIDE MONOHYDRATE 18 MCG IN CAPS
18.0000 ug | ORAL_CAPSULE | Freq: Every day | RESPIRATORY_TRACT | Status: DC
  Administered 2012-06-16 – 2012-06-21 (×6): 18 ug via RESPIRATORY_TRACT
  Filled 2012-06-16 (×2): qty 5

## 2012-06-16 MED ORDER — VITAMIN B-1 100 MG PO TABS
100.0000 mg | ORAL_TABLET | Freq: Every day | ORAL | Status: DC
  Administered 2012-06-17 – 2012-06-21 (×5): 100 mg via ORAL
  Filled 2012-06-16 (×6): qty 1

## 2012-06-16 MED ORDER — HYDROCODONE-ACETAMINOPHEN 5-325 MG PO TABS
1.0000 | ORAL_TABLET | Freq: Four times a day (QID) | ORAL | Status: DC | PRN
  Administered 2012-06-16 – 2012-06-20 (×8): 1 via ORAL
  Filled 2012-06-16 (×7): qty 1

## 2012-06-16 MED ORDER — SODIUM CHLORIDE 0.9 % IJ SOLN
3.0000 mL | Freq: Two times a day (BID) | INTRAMUSCULAR | Status: DC
  Administered 2012-06-19 – 2012-06-21 (×3): 3 mL via INTRAVENOUS

## 2012-06-16 MED ORDER — OXYCODONE HCL 5 MG PO TABS
10.0000 mg | ORAL_TABLET | ORAL | Status: DC | PRN
  Administered 2012-06-16 – 2012-06-19 (×5): 10 mg via ORAL
  Filled 2012-06-16: qty 2
  Filled 2012-06-16 (×3): qty 1
  Filled 2012-06-16 (×2): qty 2

## 2012-06-16 MED ORDER — ONDANSETRON HCL 4 MG PO TABS: 4.0000 mg | ORAL_TABLET | Freq: Four times a day (QID) | ORAL | Status: DC | PRN

## 2012-06-16 MED ORDER — FENTANYL CITRATE 0.05 MG/ML IJ SOLN
INTRAMUSCULAR | Status: DC | PRN
  Administered 2012-06-16: 50 ug via INTRAVENOUS

## 2012-06-16 MED ORDER — ALBUTEROL SULFATE (5 MG/ML) 0.5% IN NEBU: 2.5000 mg | INHALATION_SOLUTION | RESPIRATORY_TRACT | Status: DC | PRN

## 2012-06-16 MED ORDER — HYDROMORPHONE HCL PF 1 MG/ML IJ SOLN: 0.2500 mg | INTRAMUSCULAR | Status: DC | PRN

## 2012-06-16 MED ORDER — BUDESONIDE-FORMOTEROL FUMARATE 160-4.5 MCG/ACT IN AERO
2.0000 | INHALATION_SPRAY | Freq: Two times a day (BID) | RESPIRATORY_TRACT | Status: DC
  Administered 2012-06-16 – 2012-06-21 (×11): 2 via RESPIRATORY_TRACT
  Filled 2012-06-16: qty 6

## 2012-06-16 MED ORDER — PROMETHAZINE HCL 25 MG/ML IJ SOLN: 6.2500 mg | INTRAMUSCULAR | Status: DC | PRN

## 2012-06-16 MED ORDER — ENOXAPARIN SODIUM 30 MG/0.3ML ~~LOC~~ SOLN
30.0000 mg | SUBCUTANEOUS | Status: DC
  Filled 2012-06-16: qty 0.3

## 2012-06-16 MED ORDER — PROPOFOL 10 MG/ML IV EMUL
INTRAVENOUS | Status: DC | PRN
  Administered 2012-06-16: 25 ug/kg/min via INTRAVENOUS

## 2012-06-16 MED ORDER — ONDANSETRON HCL 4 MG/2ML IJ SOLN: 4.0000 mg | Freq: Four times a day (QID) | INTRAMUSCULAR | Status: DC | PRN

## 2012-06-16 MED ORDER — BUPIVACAINE IN DEXTROSE 0.75-8.25 % IT SOLN
INTRATHECAL | Status: DC | PRN
  Administered 2012-06-16: 1.7 mL via INTRATHECAL

## 2012-06-16 MED ORDER — PIPERACILLIN-TAZOBACTAM 3.375 G IVPB
3.3750 g | Freq: Three times a day (TID) | INTRAVENOUS | Status: DC
  Administered 2012-06-16 – 2012-06-19 (×11): 3.375 g via INTRAVENOUS
  Filled 2012-06-16 (×12): qty 50

## 2012-06-16 MED ORDER — HYDROCODONE-ACETAMINOPHEN 5-325 MG PO TABS: 1.0000 | ORAL_TABLET | Freq: Four times a day (QID) | ORAL | Status: DC | PRN

## 2012-06-16 MED ORDER — INFLUENZA VIRUS VACC SPLIT PF IM SUSP
0.5000 mL | INTRAMUSCULAR | Status: AC
  Administered 2012-06-17: 0.5 mL via INTRAMUSCULAR
  Filled 2012-06-16 (×2): qty 0.5

## 2012-06-16 MED ORDER — FOLIC ACID 1 MG PO TABS
1.0000 mg | ORAL_TABLET | Freq: Every day | ORAL | Status: DC
  Administered 2012-06-17 – 2012-06-21 (×5): 1 mg via ORAL
  Filled 2012-06-16 (×6): qty 1

## 2012-06-16 MED ORDER — ACETAMINOPHEN 325 MG PO TABS: 650.0000 mg | ORAL_TABLET | Freq: Four times a day (QID) | ORAL | Status: DC | PRN

## 2012-06-16 MED ORDER — SODIUM CHLORIDE 0.9 % IV SOLN
10.0000 mg | INTRAVENOUS | Status: DC | PRN
  Administered 2012-06-16: 25 ug/min via INTRAVENOUS

## 2012-06-16 MED ORDER — LACTATED RINGERS IV SOLN: INTRAVENOUS | Status: DC

## 2012-06-16 MED ORDER — MUPIROCIN 2 % EX OINT
TOPICAL_OINTMENT | CUTANEOUS | Status: AC
  Administered 2012-06-16: 14:00:00
  Filled 2012-06-16: qty 22

## 2012-06-16 SURGICAL SUPPLY — 53 items
BAG ZIPLOCK 12X15 (MISCELLANEOUS) ×2 IMPLANT
BANDAGE ELASTIC 4 VELCRO ST LF (GAUZE/BANDAGES/DRESSINGS) ×2 IMPLANT
BANDAGE ELASTIC 6 VELCRO ST LF (GAUZE/BANDAGES/DRESSINGS) ×2 IMPLANT
BANDAGE GAUZE ELAST BULKY 4 IN (GAUZE/BANDAGES/DRESSINGS) IMPLANT
CHLORAPREP W/TINT 26ML (MISCELLANEOUS) ×2 IMPLANT
CLOTH BEACON ORANGE TIMEOUT ST (SAFETY) ×2 IMPLANT
CUFF TOURN SGL QUICK 24 (TOURNIQUET CUFF) ×1
CUFF TOURN SGL QUICK 34 (TOURNIQUET CUFF)
CUFF TRNQT CYL 24X4X40X1 (TOURNIQUET CUFF) ×1 IMPLANT
CUFF TRNQT CYL 34X4X40X1 (TOURNIQUET CUFF) IMPLANT
DECANTER SPIKE VIAL GLASS SM (MISCELLANEOUS) IMPLANT
DRAPE C-ARM 42X72 X-RAY (DRAPES) IMPLANT
DRAPE OEC MINIVIEW 54X84 (DRAPES) IMPLANT
DRAPE U-SHAPE 47X51 STRL (DRAPES) ×2 IMPLANT
DRSG ADAPTIC 3X8 NADH LF (GAUZE/BANDAGES/DRESSINGS) ×2 IMPLANT
DRSG PAD ABDOMINAL 8X10 ST (GAUZE/BANDAGES/DRESSINGS) IMPLANT
DRSG VAC ATS SM SENSATRAC (GAUZE/BANDAGES/DRESSINGS) ×2 IMPLANT
DURAPREP 26ML APPLICATOR (WOUND CARE) IMPLANT
ELECT REM PT RETURN 9FT ADLT (ELECTROSURGICAL) ×2
ELECTRODE REM PT RTRN 9FT ADLT (ELECTROSURGICAL) ×1 IMPLANT
FACESHIELD LNG OPTICON STERILE (SAFETY) IMPLANT
GLOVE BIO SURGEON STRL SZ7.5 (GLOVE) ×2 IMPLANT
GLOVE ECLIPSE 8.0 STRL XLNG CF (GLOVE) ×4 IMPLANT
GLOVE ORTHO TXT STRL SZ7.5 (GLOVE) ×2 IMPLANT
GLOVE SURG ORTHO 7.0 STRL STRW (GLOVE) ×2 IMPLANT
GLOVE SURG ORTHO 8.5 STRL (GLOVE) ×2 IMPLANT
KIT BASIN OR (CUSTOM PROCEDURE TRAY) ×2 IMPLANT
MANIFOLD NEPTUNE II (INSTRUMENTS) ×2 IMPLANT
NEEDLE HYPO 22GX1.5 SAFETY (NEEDLE) IMPLANT
NEEDLE MAYO .5 CIRCLE (NEEDLE) IMPLANT
NS IRRIG 1000ML POUR BTL (IV SOLUTION) ×2 IMPLANT
PACK LOWER EXTREMITY WL (CUSTOM PROCEDURE TRAY) ×2 IMPLANT
PAD CAST 4YDX4 CTTN HI CHSV (CAST SUPPLIES) IMPLANT
PADDING CAST COTTON 4X4 STRL (CAST SUPPLIES)
POSITIONER SURGICAL ARM (MISCELLANEOUS) ×2 IMPLANT
SPLINT FIBERGLASS 4X30 (CAST SUPPLIES) ×2 IMPLANT
SPONGE GAUZE 4X4 12PLY (GAUZE/BANDAGES/DRESSINGS) ×2 IMPLANT
SPONGE LAP 18X18 X RAY DECT (DISPOSABLE) ×2 IMPLANT
SPONGE LAP 4X18 X RAY DECT (DISPOSABLE) ×4 IMPLANT
STAPLER VISISTAT 35W (STAPLE) IMPLANT
STRIP CLOSURE SKIN 1/2X4 (GAUZE/BANDAGES/DRESSINGS) IMPLANT
SUCTION FRAZIER TIP 10 FR DISP (SUCTIONS) IMPLANT
SUT ETHIBOND NAB BRD #0 18IN (SUTURE) IMPLANT
SUT ETHILON 4 0 PS 2 18 (SUTURE) IMPLANT
SUT PROLENE 3 0 PS 2 (SUTURE) IMPLANT
SUT VIC AB 2-0 CT1 27 (SUTURE)
SUT VIC AB 2-0 CT1 TAPERPNT 27 (SUTURE) IMPLANT
SUT VIC AB 3-0 PS2 18 (SUTURE)
SUT VIC AB 3-0 PS2 18XBRD (SUTURE) IMPLANT
SWAB COLLECTION DEVICE MRSA (MISCELLANEOUS) ×2 IMPLANT
SYR CONTROL 10ML LL (SYRINGE) IMPLANT
TUBE ANAEROBIC SPECIMEN COL (MISCELLANEOUS) ×2 IMPLANT
WATER STERILE IRR 1500ML POUR (IV SOLUTION) ×4 IMPLANT

## 2012-06-16 NOTE — Anesthesia Preprocedure Evaluation (Addendum)
Anesthesia Evaluation  Patient identified by MRN, date of birth, ID band Patient awake    Reviewed: Allergy & Precautions, H&P , NPO status , Patient's Chart, lab work & pertinent test results  Airway Mallampati: II TM Distance: >3 FB Neck ROM: full    Dental no notable dental hx. 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  negative psych ROS   GI/Hepatic Neg liver ROS, PUD,   Endo/Other  diabetesBorderline DM  Renal/GU negative Renal ROS  negative genitourinary   Musculoskeletal   Abdominal   Peds  Hematology negative hematology ROS (+)   Anesthesia Other Findings   Reproductive/Obstetrics negative OB ROS                           Anesthesia Physical Anesthesia Plan  ASA: III and emergent  Anesthesia Plan: Spinal   Post-op Pain Management:    Induction:   Airway Management Planned: Simple Face Mask  Additional Equipment:   Intra-op Plan:   Post-operative Plan:   Informed Consent: I have reviewed the patients History and Physical, chart, labs and discussed the procedure including the risks, benefits and alternatives for the proposed anesthesia with the patient or authorized representative who has indicated his/her understanding and acceptance.   Dental advisory given  Plan Discussed with: CRNA  Anesthesia Plan Comments:        Anesthesia Quick Evaluation

## 2012-06-16 NOTE — Progress Notes (Signed)
UR completed 

## 2012-06-16 NOTE — Consult Note (Signed)
Patricia German, MD Chief Complaint: Wound drainage History: She had open reduction and internal fixation of a left ankle fracture on December 23. 3 days ago, she had 2 screws removed because they were working their way through the skin. Today, she started having bleeding from that site. She was scheduled to see her physician today, but the appointment was canceled because of weather. She is complaining of severe pain in that ankle and she rates at 10/10. She denies fever, chills, sweats. She is taking an antibiotic but she does not know which one it is.   Past Medical History  Diagnosis Date  . Arthritis   . COPD (chronic obstructive pulmonary disease)   . Respiratory failure, acute   . Diabetes mellitus     borderline  . Hx of toe surgery     X 2  . Ankle fracture     LEFT  . On supplemental oxygen therapy     2 L continuous    No Known Allergies  No current facility-administered medications on file prior to encounter.   Current Outpatient Prescriptions on File Prior to Encounter  Medication Sig Dispense Refill  . albuterol (PROVENTIL) (2.5 MG/3ML) 0.083% nebulizer solution Take 2.5 mg by nebulization every 4 (four) hours as needed. For shortness of breath      . albuterol (VENTOLIN HFA) 108 (90 BASE) MCG/ACT inhaler Inhale 2 puffs into the lungs every 4 (four) hours as needed.        . budesonide-formoterol (SYMBICORT) 160-4.5 MCG/ACT inhaler Inhale 2 puffs into the lungs 2 (two) times daily.       Marland Kitchen tiotropium (SPIRIVA) 18 MCG inhalation capsule Place 18 mcg into inhaler and inhale daily.      Marland Kitchen HYDROcodone-acetaminophen (NORCO) 5-325 MG per tablet Take 1-2 tablets by mouth every 6 (six) hours as needed for pain.  30 tablet  0    Physical Exam: Filed Vitals:   06/15/12 2330  BP: 101/67  Pulse: 84  Temp:   Resp: 20  compartments soft/NT EHL/TA/GA intact Positive erythema/drainage from lateral aspect of distal fibula Able to see lateral fibular plate No sob/cp abd  soft/nt 1+ DP/PT pulses No knee pain or swelling  Image: Dg Chest 2 View  06/07/2012  *RADIOLOGY REPORT*  Clinical Data: Chronic bronchitis with wheezing  CHEST - 2 VIEW  Comparison: July 12, 2011.  Findings:  There is underlying emphysema.  There are bilateral nipple shadows.  There is no edema or consolidation.  The heart size and pulmonary vascularity normal.  No adenopathy.  There is thoracolumbar levoscoliosis.   IMPRESSION: Underlying emphysema.  No edema or consolidation.   Original Report Authenticated By: Bretta Bang, M.D.    Ct Tibia Fibula Left W Contrast  06/15/2012  *RADIOLOGY REPORT*  Clinical Data: Evaluate for infection; status post internal fixation of left ankle fracture.  CT OF THE LEFT TIBIA AND FIBULA WITH CONTRAST  Contrast: 80mL OMNIPAQUE IOHEXOL 300 MG/ML  SOLN  Comparison: Fluoroscopic C-arm images of the left ankle performed 04/23/2012  Findings: Evaluation for osteomyelitis is significantly limited on CT; no definite significant osseous erosions are seen, though this is difficult to differentiate from postoperative change.  MRI would likely also be suboptimal, given underlying hardware, unless areas of concern are situated distal to the hardware.  No abscess is identified.  Soft tissue swelling is noted along the left lower leg and ankle, with mild soft tissue swelling along the dorsum of the midfoot.  There is a focal soft tissue defect  overlying the distal aspect of the fibular plate, with minimal air tracking superiorly along the fibular plate.  This appears to expose the underlying hardware.  The visualized vasculature is grossly unremarkable in appearance. The visualized flexor extensor tendons are grossly unremarkable. Mild soft tissue inflammation is noted about the peroneus longus and brevis.  A small knee joint effusion is noted.  The knee joint is otherwise grossly unremarkable in appearance.  An ankle joint effusion is seen.  The fracture lines through the distal  fibula and medial malleolus are still seen, though transfixed by hardware as expected.  There is no evidence of loosening or hardware fracture.  There is diffuse osteopenia of visualized osseous structures.  IMPRESSION:  1.  No significant osseous erosions are seen, though evaluation for osteomyelitis is significantly limited on CT.  MRI would likely also be suboptimal, given underlying hardware, unless areas of concern are situated distal to the hardware. 2.  No evidence of abscess. 3.  Focal soft tissue defect overlying the distal aspect of the fibular plate, with minimal air tracking superiorly along the fibular plate.  The soft tissue defect appears to expose the underlying hardware. 4.  No evidence of loosening with respect to the tibial and fibular hardware; diffuse osteopenia of visualized osseous structures. Fracture lines are still evident.  5.  Ankle joint effusion seen; small knee joint effusion noted. 6.  Suggestion of mild soft tissue inflammation about the peroneus longus and brevis. 7.  Soft tissue swelling along the left lower leg and ankle, with mild soft tissue swelling along the dorsum of the midfoot.   Original Report Authenticated By: Tonia Ghent, M.D.     A/P:  Patient with lateral wound dehiscence. Able to see lateral fibular plate Recommend wound care consult in AM Continue Dry dressing changes Agree with IV vancomycin Will discuss with Dr Shelle Iron May require further surgery including removal of hardware and possible soft tissue coverage  Further recommendations pending

## 2012-06-16 NOTE — Progress Notes (Signed)
[facility] PROGRESS NOTE  [name]:[id number] DOB: 1971-01-02 DOA: 06/15/2012 PCP: Dorrene German, MD  Assessment/Plan:  left ankle cellulitis with hardware infection patient had hardware placed in December of last year by Dr. Shelle Iron . Her screws were removed on 06/12/12. Patient noted redness and bleeding following this.Marland Kitchen  MRI is suboptimal due to hardware presence  Started on IV Vanco and Zosyn. cultures ordered, wound care ordered.   Acute respiratory failure  Secondary to opiates. Reversed with narcan. She also has hx of  COPD on home o2. Follows with Dr Vassie Loll.  continue with o2 via [place] and nebs History of smoking since age 43, now quit.   transaminitis possibly in the setting of etoh use. She informs during 1-2  beers daily and denies heavy etoh use. Low platelets, high MCV ans transaminitis does suggest the possibility. Hepatitis panel and USG ordered. Will follow.   malnutrition  wlow albumin noted. 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 denies worsening of symptoms  Code Status: full Family Communication: none at bedside Disposition plan: pending   Consultants:  orthopedics  Procedures:  For OR today  Antibiotics:  IV vancomycin and zosyn ( 2/14>>)  HPI/Subjective: Admission H&P reviewed. Patient denies any symptoms. For OR this afternoon  Objective: Filed Vitals:   06/15/12 2330 06/16/12 0057 06/16/12 0206 06/16/12 0517  BP: 101/67 99/63  99/68  Pulse: 84 75  85  Temp:  98 F (36.7 C)  98.1 F (36.7 C)  TempSrc:  Oral  Oral  Resp: 20 18  18   Height:  5' (1.524 m)    Weight:  40.2 kg (88 lb 10 oz)    SpO2: 100% 95% 94% 94%    Intake/Output Summary (Last 24 hours) at 06/16/12 1117 Last data filed at 06/16/12 0900  Gross per 24 hour  Intake    750 ml  Output   1100 ml  Net   -350 ml   Filed Weights   06/15/12 2141 06/16/12 0057  Weight: 39.463 kg (87 lb) 40.2 kg (88 lb 10 oz)     Exam:   General: middle aged female appears quite frail and sleepy, in NAD  HEENT: no pallor, moist oral mucosa  Cardiovascular: NS1&S2, no murmurs  Respiratory: clear to auscultation b/l, no added sounds  Abdomen: soft, NT N,D BS+  Ext: warm, swelling over  left lateral malleolus, limited ROM and tender, no bleeding at present. Distal pulses palpable  CNS: AAOX3, appears fatigued and lethargic  Data Reviewed: Basic Metabolic Panel:  Recent Labs Lab 06/15/12 2007 06/16/12 0541  NA 130* 133*  K 5.1 4.8  CL 88* 94*  CO2 31 31  GLUCOSE 90 73  BUN 4* 4*  CREATININE 0.31* 0.31*  CALCIUM 8.2* 7.9*  MG  --  1.7  PHOS  --  3.6   Liver Function Tests:  Recent Labs Lab 06/15/12 2007 06/16/12 0541  AST 191* 188*  ALT 120* 119*  ALKPHOS 261* 237*  BILITOT 0.8 0.9  PROT 6.5 5.4*  ALBUMIN 2.6* 2.3*   No results found for this basename: LIPASE, AMYLASE,  in the last 168 hours No results found for this basename: AMMONIA,  in the last 168 hours CBC:  Recent Labs Lab 06/15/12 2007 06/16/12 0541  WBC 3.9* 4.0  NEUTROABS 2.2  --   HGB 16.3* 15.0  HCT 47.4* 46.1*  MCV 101.3* 104.5*  PLT 68* 60*   Cardiac Enzymes: No results found for  this basename: CKTOTAL, CKMB, CKMBINDEX, TROPONINI,  in the last 168 hours BNP (last 3 results) No results found for this basename: PROBNP,  in the last 8760 hours CBG: No results found for this basename: GLUCAP,  in the last 168 hours  No results found for this or any previous visit (from the past 240 hour(s)).   Studies: Ct Tibia Fibula Left W Contrast  06/15/2012  *RADIOLOGY REPORT*  Clinical Data: Evaluate for infection; status post internal fixation of left ankle fracture.  CT OF THE LEFT TIBIA AND FIBULA WITH CONTRAST  Contrast: 80mL OMNIPAQUE IOHEXOL 300 MG/ML  SOLN  Comparison: Fluoroscopic C-arm images of the left ankle performed 04/23/2012  Findings: Evaluation for osteomyelitis is significantly limited on CT; no  definite significant osseous erosions are seen, though this is difficult to differentiate from postoperative change.  MRI would likely also be suboptimal, given underlying hardware, unless areas of concern are situated distal to the hardware.  No abscess is identified.  Soft tissue swelling is noted along the left lower leg and ankle, with mild soft tissue swelling along the dorsum of the midfoot.  There is a focal soft tissue defect overlying the distal aspect of the fibular plate, with minimal air tracking superiorly along the fibular plate.  This appears to expose the underlying hardware.  The visualized vasculature is grossly unremarkable in appearance. The visualized flexor extensor tendons are grossly unremarkable. Mild soft tissue inflammation is noted about the peroneus longus and brevis.  A small knee joint effusion is noted.  The knee joint is otherwise grossly unremarkable in appearance.  An ankle joint effusion is seen.  The fracture lines through the distal fibula and medial malleolus are still seen, though transfixed by hardware as expected.  There is no evidence of loosening or hardware fracture.  There is diffuse osteopenia of visualized osseous structures.  IMPRESSION:  1.  No significant osseous erosions are seen, though evaluation for osteomyelitis is significantly limited on CT.  MRI would likely also be suboptimal, given underlying hardware, unless areas of concern are situated distal to the hardware. 2.  No evidence of abscess. 3.  Focal soft tissue defect overlying the distal aspect of the fibular plate, with minimal air tracking superiorly along the fibular plate.  The soft tissue defect appears to expose the underlying hardware. 4.  No evidence of loosening with respect to the tibial and fibular hardware; diffuse osteopenia of visualized osseous structures. Fracture lines are still evident.  5.  Ankle joint effusion seen; small knee joint effusion noted. 6.  Suggestion of mild soft tissue  inflammation about the peroneus longus and brevis. 7.  Soft tissue swelling along the left lower leg and ankle, with mild soft tissue swelling along the dorsum of the midfoot.   Original Report Authenticated By: Tonia Ghent, M.D.     Scheduled Meds: . albuterol  2.5 mg Nebulization Q6H  . budesonide-formoterol  2 puff Inhalation BID  . enoxaparin (LOVENOX) injection  30 mg Subcutaneous Q24H  . folic acid  1 mg Oral Daily  . [START ON 06/17/2012] influenza  inactive virus vaccine  0.5 mL Intramuscular Tomorrow-1000  . piperacillin-tazobactam (ZOSYN)  IV  3.375 g Intravenous Q8H  . sodium chloride  3 mL Intravenous Q12H  . thiamine  100 mg Oral Daily  . tiotropium  18 mcg Inhalation Daily  . vancomycin  750 mg Intravenous Q12H   Continuous Infusions: . sodium chloride 75 mL/hr at 06/16/12 1113  . dextrose 5 % and 0.45%  NaCl 75 mL/hr at 06/16/12 0109      Time spent: 25 minutes    Eddie North  [facility] Pager [phone]. If 8PM-8AM, please contact night-coverage at [email or web address], password [email or web address] 06/16/2012, 11:17 AM  LOS: 1 day

## 2012-06-16 NOTE — Progress Notes (Signed)
Subjective: 42 y/o female with PMH of diabetes and COPD admitted for left ankle cellulitis.  Pt c/o increasing pain at the left ankle with bleeding noted over the last few day.  No f/c/n/v.  Pain worse with WB and better with rest.  Pt is s/p ORIF of left ankle fracture in December.  Objective: Vital signs in last 24 hours: Temp:  [98 F (36.7 C)-99.3 F (37.4 C)] 98.1 F (36.7 C) (02/15 0517) Pulse Rate:  [75-108] 85 (02/15 0517) Resp:  [17-22] 18 (02/15 0517) BP: (99-130)/(63-88) 99/68 mmHg (02/15 0517) SpO2:  [86 %-100 %] 94 % (02/15 0517) Weight:  [39.463 kg (87 lb)-40.2 kg (88 lb 10 oz)] 40.2 kg (88 lb 10 oz) (02/15 0057)  Intake/Output from previous day: 02/14 0701 - 02/15 0700 In: 390 [I.V.:390] Out: 700 [Urine:700] Intake/Output this shift:     Recent Labs  06/15/12 2007 06/16/12 0541  HGB 16.3* 15.0    Recent Labs  06/15/12 2007 06/16/12 0541  WBC 3.9* 4.0  RBC 4.68 4.41  HCT 47.4* 46.1*  PLT 68* 60*    Recent Labs  06/15/12 2007 06/16/12 0541  NA 130* 133*  K 5.1 4.8  CL 88* 94*  CO2 31 31  BUN 4* 4*  CREATININE 0.31* 0.31*  GLUCOSE 90 73  CALCIUM 8.2* 7.9*   PE: left ankle with several small open wounds along the incision laterally.  Plate visible in the wounds.  Skin very thin.  Pt is quite cachectic appearing but in nad.  A and Ox 4.  Mood and affect normal.  EOMI.  Resp unlabored.  L ankle with cellulitis.  5/5 strength in PF and Df of the ankle and toes.  Sens to LT intact about the ankle.  Brisk cap refill at the toes.  Assessment/Plan: Left ankle wound dehiscence s/p ORIF of ankle fracture - now with cellulitis.  Pt is malnourished given her low albumin and has poor chance of healing this fracture or wound.  She says she has quit smoking.  Alcoholism suspected based on LFTs.  I believe her hardware needs to come out and a wound vac placed.  She may need soft tissue coverage by plastic surgery.  I'll make her NPO and plan to take her to the  OR this afternoon for I and D, hardware removal and wound vac placement.  She'll likely need HH for abx.  She's NWB on the L LE.   Alesia Oshields 06/16/2012, 8:32 AM

## 2012-06-16 NOTE — Brief Op Note (Signed)
06/15/2012 - 06/16/2012  3:23 PM  PATIENT:  [name]  42 y.o. female  PRE-OPERATIVE DIAGNOSIS:  1. Left ankle wound dehiscence s/p ORIF of left ankle fracture      2.  Cellulitis left ankle  POST-OPERATIVE DIAGNOSIS:  same  PROCEDURE:  1.  Left ankle removal of deep implants from the lateral malleolus   2.  Irrigation and excisional debridement of left ankle wound including skin, subcutaneous tissue, muscle and bone   3.  Application of negative pressure dressing to the left ankle wound (8.5 cm x 2.0 cm x 1.0 cm)  SURGEON:  Surgeon(s) and Role:    * Toni Arthurs, MD - Primary  ANESTHESIA:   spinal  EBL:  minimal  DRAINS: wound VAC  SPECIMEN:  Deep tissue from the lateral malleolus to micro for culture  TOURNIQUET:  20 min at 250 mm Hg  DICTATION: 161096  PATIENT DISPOSITION:  Extubated, awake and stable to recovery

## 2012-06-16 NOTE — Anesthesia Procedure Notes (Signed)
Spinal  Patient location during procedure: OR Start time: 06/16/2012 2:30 PM End time: 06/16/2012 2:36 PM Staffing Anesthesiologist: Lucille Passy F Performed by: anesthesiologist  Preanesthetic Checklist Completed: patient identified, site marked, surgical consent, pre-op evaluation, timeout performed, IV checked, risks and benefits discussed and monitors and equipment checked Spinal Block Patient position: sitting Prep: Betadine Patient monitoring: heart rate, continuous pulse ox and blood pressure Approach: midline Location: L3-4 Injection technique: single-shot Needle Needle type: Spinocan  Needle gauge: 22 G Needle length: 9 cm Additional Notes Expiration date of kit checked and confirmed. Patient tolerated procedure well, without complications. Negative heme/paresthesia DOE 06/2013 Lot [id number]

## 2012-06-16 NOTE — Transfer of Care (Signed)
Immediate Anesthesia Transfer of Care Note  Patient: [name]  Procedure(s) Performed: Procedure(s) with comments: HARDWARE REMOVAL (Left) - left ankle INCISION AND DRAINAGE (Left) APPLICATION OF WOUND VAC (Left)  Patient Location: PACU  Anesthesia Type:Spinal  Level of Consciousness: awake  Airway & Oxygen Therapy: Patient Spontanous Breathing and Patient connected to face mask oxygen  Post-op Assessment: Report given to PACU RN and Post -op Vital signs reviewed and stable  Post vital signs: Reviewed and stable  Complications: No apparent anesthesia complications

## 2012-06-16 NOTE — Progress Notes (Signed)
ANTIBIOTIC CONSULT NOTE - INITIAL  Pharmacy Consult for Vancomycin & Zosyn Indication: Cellulitis  No Known Allergies  Patient Measurements: Height: 5' (152.4 cm) Weight: 87 lb (39.463 kg) IBW/kg (Calculated) : 45.5  Vital Signs: Temp: 99.3 F (37.4 C) (02/14 1917) Temp src: Oral (02/14 1917) BP: 101/67 mmHg (02/14 2330) Pulse Rate: 84 (02/14 2330) Intake/Output from previous day:   Intake/Output from this shift:    Labs:  Recent Labs  06/15/12 2007  WBC 3.9*  HGB 16.3*  PLT 68*  CREATININE 0.31*   Estimated Creatinine Clearance: 58.9 ml/min (by C-G formula based on Cr of 0.31). No results found for this basename: VANCOTROUGH, VANCOPEAK, VANCORANDOM, GENTTROUGH, GENTPEAK, GENTRANDOM, TOBRATROUGH, TOBRAPEAK, TOBRARND, AMIKACINPEAK, AMIKACINTROU, AMIKACIN,  in the last 72 hours   Microbiology: No results found for this or any previous visit (from the past 720 hour(s)).  Medical History: Past Medical History  Diagnosis Date  . Arthritis   . COPD (chronic obstructive pulmonary disease)   . Respiratory failure, acute   . Diabetes mellitus     borderline  . Hx of toe surgery     X 2  . Ankle fracture     LEFT  . On supplemental oxygen therapy     2 L continuous    Medications:  Scheduled:  . [COMPLETED] sodium chloride   Intravenous Once  . [COMPLETED] albuterol  2.5 mg Nebulization Once  . albuterol  2.5 mg Nebulization Q6H  . budesonide-formoterol  2 puff Inhalation BID  . enoxaparin (LOVENOX) injection  30 mg Subcutaneous Q24H  . folic acid  1 mg Oral Daily  . [COMPLETED] HYDROmorphone  1 mg Intravenous Once  . [COMPLETED] ipratropium  0.5 mg Nebulization Once  . [COMPLETED] naloxone      . [COMPLETED] ondansetron (ZOFRAN) IV  4 mg Intravenous Once  . [COMPLETED] sodium chloride  1,000 mL Intravenous Once  . sodium chloride  3 mL Intravenous Q12H  . thiamine  100 mg Oral Daily  . tiotropium  18 mcg Inhalation Daily  . [COMPLETED] vancomycin  1,000  mg Intravenous Once  . [DISCONTINUED] vancomycin  1,000 mg Intravenous Once   Infusions:  . dextrose 5 % and 0.45% NaCl     Assessment:  42 year old female s/p ankle fracture and ORIF 04/2012.  Screws were removed 3 days ago. Redness and bleeding reported over past few days.  Patient received Vancomycin 1 gm IV x 1 in ED @ 20:21 on 06/15/12  IV Vancomycin & Zosyn to continue upon admission for cellulitis  Goal of Therapy:  Vancomycin trough level 10-15 mcg/ml  Plan:  Measure antibiotic drug levels at steady state Follow up culture results Vancomycin 750 mg IV q24h Zosyn 3.375gm IV q8h (each dose infused over 4 hrs)  Myriah Boggus, Joselyn Glassman, PharmD 06/16/2012,12:50 AM

## 2012-06-16 NOTE — Progress Notes (Signed)
ANTIBIOTIC CONSULT NOTE - FOLLOW UP  Pharmacy Consult for Vanc, Zosyn Indication: cellulitis with hardware  No Known Allergies  Patient Measurements: Height: 5' (152.4 cm) Weight: 88 lb 10 oz (40.2 kg) IBW/kg (Calculated) : 45.5  Vital Signs: Temp: 98.1 F (36.7 C) (02/15 0517) Temp src: Oral (02/15 0517) BP: 99/68 mmHg (02/15 0517) Pulse Rate: 85 (02/15 0517) Intake/Output from previous day: 02/14 0701 - 02/15 0700 In: 390 [I.V.:390] Out: 700 [Urine:700] Intake/Output from this shift: Total I/O In: 360 [P.O.:360] Out: 400 [Urine:400]  Labs:  Recent Labs  06/15/12 2007 06/16/12 0541  WBC 3.9* 4.0  HGB 16.3* 15.0  PLT 68* 60*  CREATININE 0.31* 0.31*   Estimated Creatinine Clearance: 59.9 ml/min (by C-G formula based on Cr of 0.31).   Assessment: 42 year old female s/p ankle fracture and ORIF 04/23/2012. On 06/12/2012, patient had 2 screws removed bc they were working their way through the skin.  Patient presented 2/14 with bleeding and severe pain from site.  IV Vancomycin & Zosyn to continue upon admission for cellulitis.    Today is Day#2 Vanc, Zosyn.  Patient is afebrile, WBC wnl.  Scr low at 0.31 for CG CrCl of 60 ml/min and Normalized CrCl > 100 ml/min.  Wound culture pending.  Ortho on board, plan I&D with hardware removal this afternoon.   Goal of Therapy:  Vancomycin trough level 10-15 mcg/ml (will aim on the higher end giving hardware)  Plan:   Change Vanc to 750 mg IV q12h  Continue Zosyn 3.375 gm IV q8h  Pharmacy will f/u  Geoffry Paradise, PharmD, BCPS Pager: [phone] 10:55 AM Pharmacy #: [id number]

## 2012-06-16 NOTE — Op Note (Signed)
NAMEKAMRON, PORTEE            ACCOUNT NO.:  [id number]  MEDICAL RECORD NO.:  [id number]  LOCATION:  1419                         FACILITY:  [facility]  PHYSICIAN:  Toni Arthurs, MD        DATE OF BIRTH:  1971-04-04  DATE OF PROCEDURE:  06/16/2012 DATE OF DISCHARGE:                              OPERATIVE REPORT   PREOPERATIVE DIAGNOSIS: 1. Left ankle wound dehiscence status post open reduction and internal     fixation of left ankle fracture. 2. Cellulitis of left ankle.  POSTOPERATIVE DIAGNOSIS: 1. Left ankle wound dehiscence status post open reduction and internal     fixation of left ankle fracture. 2. Cellulitis of left ankle.  PROCEDURE: 1. Left ankle removal of deep implants from the lateral malleolus. 2. Irrigation and excisional debridement of left ankle wound including     skin, subcutaneous tissue, muscle, and bone. 3. Application of negative pressure wound dressing to the left ankle     wound (8.5 cm x 2.0 cm x 1.0 cm).  SURGEON:  Toni Arthurs, MD  ANESTHESIA:  Spinal.  ESTIMATED BLOOD LOSS:  Minimal.  DRAINS:  Wound VAC.  SPECIMENS:  Deep tissue to Microbiology for aerobic and anaerobic culture.  TOURNIQUET TIME:  20 minutes at 250 mmHg.  COMPLICATIONS:  None.  APPARENT DISPOSITION:  Extubated, awake, and stable to recovery.  INDICATION FOR PROCEDURE:  The patient is a 42 year old female, who is status post ORIF of a left ankle bimalleolar fracture, back in late December 2013.  She has experienced wound dehiscence and presented to the emergency department with cellulitis of the left ankle.  She was admitted to the medicine service and started on IV vancomycin and Zosyn. She presents now for removal of the hardware, irrigation and debridement of the wound, and application of a wound VAC negative pressure dressing. She understands the risks and benefits, the alternative treatment options and elects surgical treatment.  She specifically understands risks of  bleeding, infection, nerve damage, blood clots, need for additional surgery, amputation, and death.  PROCEDURE IN DETAIL:  After preoperative consent was obtained, and the correct operative site was identified, the patient was brought to the operating room and placed supine on the operating table.  A spinal anesthesia was administered.  The patient was already on therapeutic antibiotics.  A surgical time-out was taken.  Left lower extremity was prepped and draped in standard sterile fashion the tourniquet around the thigh.  The extremity was exsanguinated and tourniquet was inflated to 250 mmHg.  The patient's previous incision was made again sharply.  The plate was evident through several areas of open wound.  All the screws were removed.  The plate was removed in its entirety.  The deep tissue was obtained from several of the screw holes and passed off the field as a specimen to Microbiology.  Excisional debridement was then carried out in circumferential fashion.  Using a scalpel and rongeur.  The wound edges were excised in their entirety in circumferential fashion.  The subcutaneous tissue was all then debrided of necrotic and fibrinous tissue.  The screw holes were curetted of all fibrous tissue and loose bone.  The fracture site maintained a reduced position  throughout this procedure.  The fracture site appeared generally stable.  The muscle of the lateral compartment was also sharply debrided at the level of the incision.  The wound was then irrigated with 3 L of normal saline, and again excisional debridement was carried out as described above.  Final irrigation was performed, and the wound was measured.  Wound measured 8.5 cm long x 2 cm wide x 1 cm deep.  The wound VAC sponge was then cut to fit this wound it was held in place while the occlusive dressings were applied.  The [facility] device was then applied and 125 mmHg suction was started.  Wound VAC was noted to have appropriate  seal.  Tourniquet was released after 20 minutes.  The patient was then awakened from anesthesia and transported to recovery room in stable condition.  Prior to awakening her from anesthesia, a posterior splint was applied in neutral position.  FOLLOWUP PLAN:  The patient will be nonweightbearing on the left lower extremity.  She will continue on vancomycin and Zosyn pending the outcome of her cultures.  She will likely need a Plastic Surgery consultation for lateral wound coverage.     Toni Arthurs, MD     JH/MEDQ  D:  06/16/2012  T:  06/16/2012  Job:  [id number]

## 2012-06-16 NOTE — ED Notes (Signed)
Wound area left undressed to show floor nurse. Floor RN will address sterile dressing

## 2012-06-17 ENCOUNTER — Inpatient Hospital Stay (HOSPITAL_COMMUNITY): Payer: Medicaid Other

## 2012-06-17 DIAGNOSIS — T8131XA Disruption of external operation (surgical) wound, not elsewhere classified, initial encounter: Secondary | ICD-10-CM

## 2012-06-17 LAB — BASIC METABOLIC PANEL
BUN: 5 mg/dL — ABNORMAL LOW (ref 6–23)
CO2: 31 mEq/L (ref 19–32)
Calcium: 7.8 mg/dL — ABNORMAL LOW (ref 8.4–10.5)
Creatinine, Ser: 0.37 mg/dL — ABNORMAL LOW (ref 0.50–1.10)
Glucose, Bld: 80 mg/dL (ref 70–99)

## 2012-06-17 LAB — CBC
HCT: 44 % (ref 36.0–46.0)
MCH: 34.4 pg — ABNORMAL HIGH (ref 26.0–34.0)
MCV: 105.8 fL — ABNORMAL HIGH (ref 78.0–100.0)
Platelets: 59 10*3/uL — ABNORMAL LOW (ref 150–400)
RDW: 15.7 % — ABNORMAL HIGH (ref 11.5–15.5)

## 2012-06-17 LAB — HEPATIC FUNCTION PANEL
Bilirubin, Direct: 0.2 mg/dL (ref 0.0–0.3)
Indirect Bilirubin: 0.6 mg/dL (ref 0.3–0.9)
Total Bilirubin: 0.8 mg/dL (ref 0.3–1.2)

## 2012-06-17 MED ORDER — GLUCERNA SHAKE PO LIQD
237.0000 mL | Freq: Two times a day (BID) | ORAL | Status: DC
  Administered 2012-06-17 – 2012-06-21 (×8): 237 mL via ORAL
  Filled 2012-06-17 (×9): qty 237

## 2012-06-17 MED ORDER — POLYVINYL ALCOHOL 1.4 % OP SOLN
1.0000 [drp] | OPHTHALMIC | Status: DC | PRN
  Administered 2012-06-17: 1 [drp] via OPHTHALMIC
  Filled 2012-06-17: qty 15

## 2012-06-17 NOTE — Progress Notes (Signed)
Subjective: 1 Day Post-Op Procedure(s) (LRB): HARDWARE REMOVAL (Left) INCISION AND DRAINAGE (Left) APPLICATION OF WOUND VAC (Left) Patient reports pain as 3 on 0-10 scale.    Objective: Vital signs in last 24 hours: Temp:  [98 F (36.7 C)-98.7 F (37.1 C)] 98.7 F (37.1 C) (02/16 0459) Pulse Rate:  [53-97] 73 (02/16 0459) Resp:  [8-22] 18 (02/16 0459) BP: (90-136)/(60-87) 93/62 mmHg (02/16 0459) SpO2:  [95 %-100 %] 96 % (02/16 0459)  Intake/Output from previous day: 02/15 0701 - 02/16 0700 In: 1280 [P.O.:480; I.V.:800] Out: 1450 [Urine:1450] Intake/Output this shift:     Recent Labs  06/15/12 2007 06/16/12 0541 06/17/12 0513  HGB 16.3* 15.0 14.3    Recent Labs  06/16/12 0541 06/17/12 0513  WBC 4.0 4.1  RBC 4.41 4.16  HCT 46.1* 44.0  PLT 60* 59*    Recent Labs  06/16/12 0541 06/17/12 0513  NA 133* 133*  K 4.8 4.0  CL 94* 95*  CO2 31 31  BUN 4* 5*  CREATININE 0.31* 0.37*  GLUCOSE 73 80  CALCIUM 7.9* 7.8*   No results found for this basename: LABPT, INR,  in the last 72 hours  Incision: dressing C/D/I Toes good cap refill vac intact Assessment/Plan: 1 Day Post-Op Procedure(s) (LRB): HARDWARE REMOVAL (Left) INCISION AND DRAINAGE (Left) APPLICATION OF WOUND VAC (Left) Continue ABX therapy due to Post-op infection cultures pending continue current treatments no change  [name] [name] 06/17/2012, 9:30 AM

## 2012-06-17 NOTE — Evaluation (Addendum)
Physical Therapy Evaluation Patient Details Name: [name] MRN: [id number] DOB: 01-16-1971 Today's Date: 06/17/2012 Time: 1354-1410 PT Time Calculation (min): 16 min  PT Assessment / Plan / Recommendation Clinical Impression  Pt presents with cellulitis and wound dihiscence s/p hardware removal and wound vac placement on LLE.  Noted that pt is s/p ORIF of L ankle in Dec 2013 following fracture.  Also note in chart, pts history of Friedreich's ataxia.  Tolerated sitting EOB and was able to pivot to chair with +2 assist for safety due to ataxic movements.  Pt will benefit from skilled PT in acute venue to address deficits.  PT recommends SNF for follow up at D/C to maximize pts safety.    PT Assessment  Patient needs continued PT services    Follow Up Recommendations  SNF;Supervision/Assistance - 24 hour    Does the patient have the potential to tolerate intense rehabilitation      Barriers to Discharge Decreased caregiver support pt states 24/7 care, no family present at time of eval.     Equipment Recommendations  None recommended by PT    Recommendations for Other Services OT consult   Frequency Min 3X/week    Precautions / Restrictions Precautions Precautions: Fall Precaution Comments: has wound vac on left leg Restrictions Weight Bearing Restrictions: Yes LLE Weight Bearing: Non weight bearing   Pertinent Vitals/Pain Pt with pain in LLE during mobility.       Mobility  Bed Mobility Bed Mobility: Supine to Sit Supine to Sit: 4: Min assist Details for Bed Mobility Assistance: Assist for LLE out of bed with cues for hand placement and technique.  Noted ataxic like movements in LLE when moving to EOB.   Transfers Transfers: Sit to Stand;Stand to Sit;Stand Pivot Transfers Sit to Stand: 1: +2 Total assist;From elevated surface;With upper extremity assist;From bed Sit to Stand: Patient Percentage: 60% Stand to Sit: 1: +2 Total assist;With upper extremity  assist;With armrests;To chair/3-in-1 Stand to Sit: Patient Percentage: 50% Stand Pivot Transfers: 1: +2 Total assist Stand Pivot Transfers: Patient Percentage: 50% Details for Transfer Assistance: Assist to rise, steady and ensure controlled descent with max cues for hand placement, safety and sequencing/technique with RW.  Pt able take small hops and pivot to chair, however again noted ataxic like movements in LLE and also when using RW, requiring assist to negotiate RW during transfer.  Ambulation/Gait Ambulation/Gait Assistance: Not tested (comment) Assistive device: Rolling walker    Exercises     PT Diagnosis: Difficulty walking;Generalized weakness;Acute pain  PT Problem List: Decreased strength;Decreased activity tolerance;Decreased balance;Decreased mobility;Decreased coordination;Decreased knowledge of use of DME;Decreased safety awareness;Decreased knowledge of precautions;Pain PT Treatment Interventions: DME instruction;Gait training;Functional mobility training;Therapeutic activities;Therapeutic exercise;Balance training;Patient/family education   PT Goals Acute Rehab PT Goals PT Goal Formulation: With patient Time For Goal Achievement: 07/01/12 Potential to Achieve Goals: Fair Pt will go Supine/Side to Sit: with supervision PT Goal: Supine/Side to Sit - Progress: Goal set today Pt will go Sit to Supine/Side: with supervision PT Goal: Sit to Supine/Side - Progress: Goal set today Pt will go Sit to Stand: with min assist PT Goal: Sit to Stand - Progress: Goal set today Pt will go Stand to Sit: with min assist PT Goal: Stand to Sit - Progress: Goal set today Pt will Transfer Bed to Chair/Chair to Bed: with mod assist PT Transfer Goal: Bed to Chair/Chair to Bed - Progress: Goal set today Pt will Ambulate: 1 - 15 feet;with mod assist;with least restrictive assistive device PT  Goal: Ambulate - Progress: Goal set today  Visit Information  Last PT Received On:  06/17/12 Assistance Needed: +2    Subjective Data  Subjective: I was using a w/c at home.  Patient Stated Goal: n/a   Prior Functioning  Home Living Lives With: Alone;Son Available Help at Discharge: Family;Available 24 hours/day Type of Home: House Home Access: Stairs to enter [employer] of Steps: 2-3 Home Layout: One level Bathroom Toilet: Standard Home Adaptive Equipment: Bedside commode/3-in-1;Walker - rolling;Straight cane;Wheelchair - manual Additional Comments: pt states family was getting her up/down stairs with w/c.  She also states that she was not getting up at home, only getting into w/c.  Prior Function Level of Independence: Needs assistance Needs Assistance: Bathing;Dressing;Meal Prep;Light Housekeeping;Transfers;Toileting Able to Take Stairs?: No Driving: No Communication Communication: No difficulties    Cognition  Cognition Overall Cognitive Status: Appears within functional limits for tasks assessed/performed Arousal/Alertness: Awake/alert Orientation Level: Appears intact for tasks assessed Behavior During Session: [facility] for tasks performed    Extremity/Trunk Assessment Right Lower Extremity Assessment RLE ROM/Strength/Tone: [facility] for tasks assessed RLE Sensation: WFL - Light Touch Left Lower Extremity Assessment LLE ROM/Strength/Tone: Unable to fully assess;Due to precautions LLE Sensation: WFL - Light Touch Trunk Assessment Trunk Assessment: Normal   Balance    End of Session PT - End of Session Equipment Utilized During Treatment: Gait belt Activity Tolerance: Patient limited by pain Patient left: in chair;with call bell/phone within reach Nurse Communication: Mobility status  GP     Vista Deck 06/17/2012, 3:04 PM

## 2012-06-17 NOTE — Progress Notes (Signed)
[facility] PROGRESS NOTE  [name]:[id number] DOB: 01-12-1971 DOA: 06/15/2012 PCP: Dorrene German, MD   Brief narrative 42 year old female with headaches ataxia currently wheelchair-bound, history of COPD on home O2 with recent removal of hardware from her left ankle fracture (from ORIF of left ankle fracture 1 year back) presented to the ED with cellulitis and wound dehiscence of the left ankle.  Assessment/plan left ankle cellulitis with hardware infection  patient had hardware placed in December of last year by Dr. Shelle Iron . Her screws were removed on 06/12/12. Patient noted redness and bleeding following this.Marland Kitchen MRI is suboptimal due to hardware presence  Started on IV Vanco and Zosyn. Culture growing moderate staph aureus. Sensitivity pending Patient seen by orthopedics and taken to warmer with removal of deep implants from lateral malleolus with irrigation and excisional debridement of left ankle wound including skin, subcutaneous tissue muscle and bone. She tolerated procedure well. Remains afebrile. Continue IV vancomycin and Zosyn for now. -Pain control with close monitoring  Acute respiratory failure  Patient given a dose of Dilaudid in the ED and went into acute respiratory failure.. Reversed with narcan. She also has hx of COPD on home o2. Follows with Dr Vassie Loll.  continue with o2 via [place] and nebs  History of smoking since age 71, now quit.   transaminitis  possibly in the setting of etoh use. She informs having 1-2 beers daily and denies heavy etoh use. Low platelets, high MCV ans transaminitis does suggest the possibility. Hepatitis panel negative. Ultrasound abdomen shows fatty changes. Counseled on EtOH cessation  malnutrition  Patient clinically appears quite malnourished low albumin noted. The patient consulted  Frederic's ataxia  patient is wheelchair bound. denies worsening of symptoms   Code Status: full  Family Communication: none at bedside   Disposition plan: pending   Consultants:  orthopedics Procedures:  Irrigation and debridement of left ankle wound on 2/15 Antibiotics:  IV vancomycin and zosyn ( 2/14>>) HPI/Subjective:  Admission H&P reviewed. Patient denies any symptoms. For OR this afternoon   Objective: Filed Vitals:   06/16/12 2126 06/17/12 0223 06/17/12 0459 06/17/12 1207  BP: 96/71  93/62   Pulse: 63  73   Temp: 98.6 F (37 C)  98.7 F (37.1 C)   TempSrc: Oral  Oral   Resp: 16  18   Height:      Weight:      SpO2: 98% 97% 96% 97%    Intake/Output Summary (Last 24 hours) at 06/17/12 1243 Last data filed at 06/17/12 0500  Gross per 24 hour  Intake    920 ml  Output   1050 ml  Net   -130 ml   Filed Weights   06/15/12 2141 06/16/12 0057  Weight: 39.463 kg (87 lb) 40.2 kg (88 lb 10 oz)    Exam: General: middle aged female appears quite frail in NAD , more alert today HEENT: no pallor, moist oral mucosa  Cardiovascular: NS1&S2, no murmurs  Respiratory: clear to auscultation b/l, no added sounds  Abdomen: soft, NT N,D BS+  Ext: warm, dressing over left ankle CNS: AAOX3, appears fatigued      Data Reviewed: Basic Metabolic Panel:  Recent Labs Lab 06/15/12 2007 06/16/12 0541 06/17/12 0513  NA 130* 133* 133*  K 5.1 4.8 4.0  CL 88* 94* 95*  CO2 31 31 31   GLUCOSE 90 73 80  BUN 4* 4* 5*  CREATININE 0.31* 0.31* 0.37*  CALCIUM 8.2* 7.9* 7.8*  MG  --  1.7  --  PHOS  --  3.6  --    Liver Function Tests:  Recent Labs Lab 06/15/12 2007 06/16/12 0541  AST 191* 188*  ALT 120* 119*  ALKPHOS 261* 237*  BILITOT 0.8 0.9  PROT 6.5 5.4*  ALBUMIN 2.6* 2.3*   No results found for this basename: LIPASE, AMYLASE,  in the last 168 hours No results found for this basename: AMMONIA,  in the last 168 hours CBC:  Recent Labs Lab 06/15/12 2007 06/16/12 0541 06/17/12 0513  WBC 3.9* 4.0 4.1  NEUTROABS 2.2  --   --   HGB 16.3* 15.0 14.3  HCT 47.4* 46.1* 44.0  MCV 101.3* 104.5* 105.8*   PLT 68* 60* 59*   Cardiac Enzymes: No results found for this basename: CKTOTAL, CKMB, CKMBINDEX, TROPONINI,  in the last 168 hours BNP (last 3 results) No results found for this basename: PROBNP,  in the last 8760 hours CBG:  Recent Labs Lab 06/16/12 1355 06/16/12 1604  GLUCAP 94 82    Recent Results (from the past 240 hour(s))  WOUND CULTURE     Status: None   Collection Time    06/15/12  7:56 PM      Result Value Range Status   Specimen Description LEG   Final   Special Requests Normal   Final   Gram Stain     Final   Value: FEW WBC PRESENT, PREDOMINANTLY PMN     RARE SQUAMOUS EPITHELIAL CELLS PRESENT     FEW GRAM POSITIVE COCCI     IN CLUSTERS   Culture     Final   Value: MODERATE STAPHYLOCOCCUS AUREUS     Note: RIFAMPIN AND GENTAMICIN SHOULD NOT BE USED AS SINGLE DRUGS FOR TREATMENT OF STAPH INFECTIONS.   Report Status PENDING   Incomplete  MRSA PCR SCREENING     Status: None   Collection Time    06/16/12 11:00 AM      Result Value Range Status   MRSA by PCR NEGATIVE  NEGATIVE Final   Comment:            The GeneXpert MRSA Assay (FDA     approved for NASAL specimens     only), is one component of a     comprehensive MRSA colonization     surveillance program. It is not     intended to diagnose MRSA     infection nor to guide or     monitor treatment for     MRSA infections.  TISSUE CULTURE     Status: None   Collection Time    06/16/12  2:59 PM      Result Value Range Status   Specimen Description ANKLE LEFT TISSUE   Final   Special Requests NONE ZOSYN AND VANCOMYCIN   Final   Gram Stain PENDING   Incomplete   Culture Culture reincubated for better growth   Final   Report Status PENDING   Incomplete  ANAEROBIC CULTURE     Status: None   Collection Time    06/16/12  2:59 PM      Result Value Range Status   Specimen Description ANKLE LEFT TISSUE   Final   Special Requests ZOSYN AND VANCOMYCIN   Final   Gram Stain PENDING   Incomplete   Culture      Final   Value: NO ANAEROBES ISOLATED; CULTURE IN PROGRESS FOR 5 DAYS   Report Status PENDING   Incomplete     Studies: US Abdomen Complete  06/17/2012  *RADIOLOGY REPORT*  Clinical Data:  42 year old female with elevated LFTs, hypernatremia and abdominal discomfort.  History of cholecystectomy.  ABDOMINAL ULTRASOUND COMPLETE  Comparison:  None.  Findings:  Gallbladder: The gallbladder is not visualized compatible with cholecystectomy.  Common Bile Duct:  There is no evidence of intrahepatic or extrahepatic biliary dilation. The CBD measures 5.3 mm in greatest diameter.  Liver: The hepatic parenchyma is slightly coarse and mildly increased in echogenicity.  No focal abnormalities are identified.  IVC:  Appears normal.  Pancreas:  Although the pancreas is difficult to visualize in its entirety, no focal pancreatic abnormality is identified.  Spleen:  Within normal limits in size and echotexture.  Right kidney:  The right kidney is normal in size and parenchymal echogenicity.  There is no evidence of solid mass, hydronephrosis or definite renal calculi.  The right kidney measures 11.7 cm.  Left kidney:  The left kidney is normal in size and parenchymal echogenicity.  There is no evidence of solid mass, hydronephrosis or definite renal calculi.   The left kidney measures 12.1 cm.  Abdominal Aorta:  No abdominal aortic aneurysm identified.  There is no evidence of ascites. Very small bilateral pleural effusions are noted.  IMPRESSION: Slightly coarsened and echogenic hepatic parenchyma - question fatty infiltration.  Very small bilateral pleural effusions.  No evidence of biliary dilatation.   Original Report Authenticated By: Harmon Pier, M.D.    Ct Tibia Fibula Left W Contrast  06/15/2012  *RADIOLOGY REPORT*  Clinical Data: Evaluate for infection; status post internal fixation of left ankle fracture.  CT OF THE LEFT TIBIA AND FIBULA WITH CONTRAST  Contrast: 80mL OMNIPAQUE IOHEXOL 300 MG/ML  SOLN  Comparison:  Fluoroscopic C-arm images of the left ankle performed 04/23/2012  Findings: Evaluation for osteomyelitis is significantly limited on CT; no definite significant osseous erosions are seen, though this is difficult to differentiate from postoperative change.  MRI would likely also be suboptimal, given underlying hardware, unless areas of concern are situated distal to the hardware.  No abscess is identified.  Soft tissue swelling is noted along the left lower leg and ankle, with mild soft tissue swelling along the dorsum of the midfoot.  There is a focal soft tissue defect overlying the distal aspect of the fibular plate, with minimal air tracking superiorly along the fibular plate.  This appears to expose the underlying hardware.  The visualized vasculature is grossly unremarkable in appearance. The visualized flexor extensor tendons are grossly unremarkable. Mild soft tissue inflammation is noted about the peroneus longus and brevis.  A small knee joint effusion is noted.  The knee joint is otherwise grossly unremarkable in appearance.  An ankle joint effusion is seen.  The fracture lines through the distal fibula and medial malleolus are still seen, though transfixed by hardware as expected.  There is no evidence of loosening or hardware fracture.  There is diffuse osteopenia of visualized osseous structures.  IMPRESSION:  1.  No significant osseous erosions are seen, though evaluation for osteomyelitis is significantly limited on CT.  MRI would likely also be suboptimal, given underlying hardware, unless areas of concern are situated distal to the hardware. 2.  No evidence of abscess. 3.  Focal soft tissue defect overlying the distal aspect of the fibular plate, with minimal air tracking superiorly along the fibular plate.  The soft tissue defect appears to expose the underlying hardware. 4.  No evidence of loosening with respect to the tibial and fibular hardware; diffuse osteopenia of visualized  osseous  structures. Fracture lines are still evident.  5.  Ankle joint effusion seen; small knee joint effusion noted. 6.  Suggestion of mild soft tissue inflammation about the peroneus longus and brevis. 7.  Soft tissue swelling along the left lower leg and ankle, with mild soft tissue swelling along the dorsum of the midfoot.   Original Report Authenticated By: Tonia Ghent, M.D.     Scheduled Meds: . albuterol  2.5 mg Nebulization Q6H  . budesonide-formoterol  2 puff Inhalation BID  . folic acid  1 mg Oral Daily  . influenza  inactive virus vaccine  0.5 mL Intramuscular Tomorrow-1000  . piperacillin-tazobactam (ZOSYN)  IV  3.375 g Intravenous Q8H  . sodium chloride  3 mL Intravenous Q12H  . thiamine  100 mg Oral Daily  . tiotropium  18 mcg Inhalation Daily  . vancomycin  750 mg Intravenous Q12H   Continuous Infusions: . dextrose 5 % and 0.45% NaCl 75 mL/hr at 06/16/12 0109      Time spent: 25 minutes    Vikrant Pryce  [facility] Pager [phone] If 8PM-8AM, please contact night-coverage at [email or web address], password [facility] 06/17/2012, 12:43 PM  LOS: 2 days

## 2012-06-18 ENCOUNTER — Encounter (HOSPITAL_COMMUNITY): Payer: Self-pay | Admitting: Orthopedic Surgery

## 2012-06-18 DIAGNOSIS — Z22322 Carrier or suspected carrier of Methicillin resistant Staphylococcus aureus: Secondary | ICD-10-CM

## 2012-06-18 LAB — WOUND CULTURE

## 2012-06-18 MED ORDER — HYDROMORPHONE HCL PF 1 MG/ML IJ SOLN
0.2500 mg | INTRAMUSCULAR | Status: DC | PRN
  Administered 2012-06-18: 0.5 mg via INTRAVENOUS
  Filled 2012-06-18: qty 1

## 2012-06-18 MED ORDER — VANCOMYCIN HCL 500 MG IV SOLR
500.0000 mg | Freq: Two times a day (BID) | INTRAVENOUS | Status: DC
  Administered 2012-06-19 (×2): 500 mg via INTRAVENOUS
  Filled 2012-06-18 (×3): qty 500

## 2012-06-18 MED ORDER — DM-GUAIFENESIN ER 30-600 MG PO TB12
1.0000 | ORAL_TABLET | Freq: Two times a day (BID) | ORAL | Status: DC
  Administered 2012-06-18 (×2): 1 via ORAL
  Administered 2012-06-19: 23:00:00 via ORAL
  Administered 2012-06-19 – 2012-06-21 (×4): 1 via ORAL
  Filled 2012-06-18 (×8): qty 1

## 2012-06-18 NOTE — Progress Notes (Signed)
ANTIBIOTIC CONSULT NOTE - FOLLOW UP  Pharmacy Consult for Vanc, Zosyn Indication: cellulitis with hardware  No Known Allergies  Patient Measurements: Height: 5' (152.4 cm) Weight: 88 lb 10 oz (40.2 kg) IBW/kg (Calculated) : 45.5  Vital Signs: Temp: 98.6 F (37 C) (02/17 0700) Temp src: Oral (02/17 0700) BP: 90/56 mmHg (02/17 0700) Pulse Rate: 76 (02/17 0700) Intake/Output from previous day: 02/16 0701 - 02/17 0700 In: 1080 [P.O.:1080] Out: 1750 [Urine:1700; Drains:50] Intake/Output from this shift: Total I/O In: 357 [P.O.:357] Out: 800 [Urine:500; Stool:300]  Labs:  Recent Labs  06/15/12 2007 06/16/12 0541 06/17/12 0513  WBC 3.9* 4.0 4.1  HGB 16.3* 15.0 14.3  PLT 68* 60* 59*  CREATININE 0.31* 0.31* 0.37*   Estimated Creatinine Clearance: 59.9 ml/min (by C-G formula based on Cr of 0.37).   Assessment: 42 year old female s/p ankle fracture and ORIF 04/23/2012. On 06/12/2012, patient had 2 screws removed bc they were working their way through the skin.  Patient presented 2/14 with bleeding and severe pain from site.  IV Vancomycin & Zosyn to continue upon admission for cellulitis.    Today is Day#4 Vanc, Zosyn.  Patient is afebrile, WBC wnl.  Scr low at 0.37 for CG CrCl of 60 ml/min and Normalized CrCl > 100 ml/min.  Wound culture growing MRSA s/p I&D with hardware removal 2/15  Blood cultures ordered today to r/o bacteremia  Vanc trough slightly supratherapeutic (21.9) on 750mg  IV q12h  Goal of Therapy:  Vancomycin trough level 10-15 mcg/ml (will aim on the higher end given hardware)  Plan:   Decrease Vanc to 500 mg IV q12h  Continue Zosyn 3.375 gm IV q8h  Pharmacy will f/u  Loralee Pacas, PharmD, BCPS Pager: [phone] 06/18/2012 11:19 AM

## 2012-06-18 NOTE — Progress Notes (Signed)
PT Cancellation Note  ___Treatment cancelled today due to medical issues with patient which prohibited therapy  ___ Treatment cancelled today due to patient receiving procedure or test   ___ Treatment cancelled today due to patient's refusal to participate   _X_ Treatment cancelled today due to hypotension...Marland KitchenMarland KitchenMarland Kitchenblood pressure 86/61 and difficult to arouse pt enough to participate.  Felecia Shelling  PTA [facility]  Acute  Rehab Pager      [phone]

## 2012-06-18 NOTE — Consult Note (Signed)
WOC consult Note Reason for Consult:First surgical dressing change to Left LE.  PA Andrez Grime present and assisting with dressing change. Wound type: Surgical Pressure Ulcer POA: No Measurement: 9cm x 2cm x 1cm  Wound ZOX:WRUEA red and bleeding (venous) Drainage (amount, consistency, odor) None Periwound:intact Dressing procedure/placement/frequency: Patient was premedicated with PO analgesic; patient very uncomfortable during and following dressing change, so IV order given by PA Bissell for pre-dressing changes as needed.   Patient tolerated establishment of NPWT "seal" and is resting comfortable at the time of this writing-10 minutes post dressing change.  negative pressure (continuous) wound therapy established and dressing intact.  One piece of black granufoam used as wound contact layer.  Dressing changes may be performed by staff nurse on a M-W-F schedule. I will not follow, but will remain available to this patient as well as the nursing and medical and ortho teams.  Please re-consult if needed. Thanks, Ladona Mow, MSN, RN, [facility], CWOCN [phone])

## 2012-06-18 NOTE — Progress Notes (Signed)
Subjective: 2 Days Post-Op Procedure(s) (LRB): HARDWARE REMOVAL (Left) INCISION AND DRAINAGE (Left) APPLICATION OF WOUND VAC (Left) Patient reports pain as mild.  Improving but still notes soreness L ankle. No other c/o's  Objective: Vital signs in last 24 hours: Temp:  [97.8 F (36.6 C)-98.6 F (37 C)] 98.6 F (37 C) (02/17 0700) Pulse Rate:  [65-76] 76 (02/17 0700) Resp:  [18-20] 18 (02/17 0700) BP: (90-103)/(56-72) 90/56 mmHg (02/17 0700) SpO2:  [97 %-98 %] 98 % (02/17 0840)  Intake/Output from previous day: 02/16 0701 - 02/17 0700 In: 1080 [P.O.:1080] Out: 1750 [Urine:1700; Drains:50] Intake/Output this shift: Total I/O In: -  Out: 200 [Urine:200]   Recent Labs  06/15/12 2007 06/16/12 0541 06/17/12 0513  HGB 16.3* 15.0 14.3    Recent Labs  06/16/12 0541 06/17/12 0513  WBC 4.0 4.1  RBC 4.41 4.16  HCT 46.1* 44.0  PLT 60* 59*    Recent Labs  06/16/12 0541 06/17/12 0513  NA 133* 133*  K 4.8 4.0  CL 94* 95*  CO2 31 31  BUN 4* 5*  CREATININE 0.31* 0.37*  GLUCOSE 73 80  CALCIUM 7.9* 7.8*   No results found for this basename: LABPT, INR,  in the last 72 hours  Neurologically intact Neurovascular intact Sensation intact distally Intact pulses distally Dorsiflexion/Plantar flexion intact Incision: dressing C/D/I Compartment soft Wound vac in place No calf pain or sign of DVT  Assessment/Plan: 2 Days Post-Op Procedure(s) (LRB): HARDWARE REMOVAL (Left) INCISION AND DRAINAGE (Left) APPLICATION OF WOUND VAC (Left) Advance diet Up with therapy Continue ABX therapy due to Post-op infection Consult plastics and wound care for vac care per Dr. Shelle Iron Cx pending, will watch Discussed with Dr. Elissa Lovett, Dayna Barker. 06/18/2012, 9:22 AM

## 2012-06-18 NOTE — Progress Notes (Signed)
[facility] PROGRESS NOTE  [name] ZOX:[id number] DOB: 1970/07/29 DOA: 06/15/2012 PCP: Dorrene German, MD  Brief narrative  42 year old female with headaches ataxia currently wheelchair-bound, history of COPD on home O2 with recent removal of hardware from her left ankle fracture (from ORIF of left ankle fracture 1 year back) presented to the ED with cellulitis and wound dehiscence of the left ankle.   Assessment/plan  left ankle cellulitis with hardware infection  patient had hardware placed in December of last year by Dr. Shelle Iron . Her screws were removed on 06/12/12. Patient noted redness and bleeding following this.Marland Kitchen MRI is suboptimal due to hardware presence  Started on IV Vanco and Zosyn. Culture growing moderate staph aureus. Sensitivity pending  Patient seen by orthopedics and taken to warmer with removal of deep implants from lateral malleolus with irrigation and excisional debridement of left ankle wound including skin, subcutaneous tissue muscle and bone. Has a wound VAC in place. She tolerated procedure well. Remains afebrile. Continue IV vancomycin and Zosyn for now.  -Pain control with close monitoring  -Culture growing MRSA. Blood culture ordered. Likely needs prolonged IV antibiotics with PICC line. We'll discuss with ID following final culture and sensitivity  Acute respiratory failure  Patient given a dose of Dilaudid in the ED and went into acute respiratory failure.. Reversed with narcan. She also has hx of COPD on home o2. Follows with Dr Vassie Loll.  continue with o2 via  and nebs  History of smoking since age 54, now quit.   transaminitis  possibly in the setting of etoh use. She informs having 1-2 beers daily and denies heavy etoh use. Low platelets, high MCV ans transaminitis does suggest the possibility. Hepatitis panel negative. Ultrasound abdomen shows fatty changes. Counseled on EtOH cessation  -LFTs improving  malnutrition  Patient clinically appears  quite malnourished low albumin noted. 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 denies worsening of symptoms   Code Status: full  Family Communication: none at bedside  Disposition plan: Likely home with home health versus  SNF  Consultants:  orthopedics Procedures:  Irrigation and debridement of left ankle wound on 2/15 Antibiotics:  IV vancomycin and zosyn ( 2/14>>)  HPI/Subjective:  Patient complains of some soreness over the surgical site. Wound culture growing MRSA. Remains afebrile. Complain of some cough      Objective: Filed Vitals:   06/18/12 0140 06/18/12 0700 06/18/12 0840 06/18/12 1130  BP:  90/56  92/60  Pulse:  76  75  Temp:  98.6 F (37 C)  98.2 F (36.8 C)  TempSrc:  Oral  Oral  Resp:  18  18  Height:      Weight:      SpO2: 97% 97% 98% 96%    Intake/Output Summary (Last 24 hours) at 06/18/12 1315 Last data filed at 06/18/12 1044  Gross per 24 hour  Intake   1317 ml  Output   2550 ml  Net  -1233 ml   Filed Weights   06/15/12 2141 06/16/12 0057  Weight: 39.463 kg (87 lb) 40.2 kg (88 lb 10 oz)    Exam:  General: middle aged female appears quite frail in NAD ,  HEENT: no pallor, moist oral mucosa Cardiovascular: NS1&S2, no murmurs  Respiratory: clear to auscultation b/l, no added sounds  Abdomen: soft, NT ND BS+  Ext: warm, dressing over left ankle. Wound VAC in place  CNS: AAOX3, appears fatigued    Data Reviewed: Basic Metabolic Panel:  Recent Labs  Lab 06/15/12 2007 06/16/12 0541 06/17/12 0513  NA 130* 133* 133*  K 5.1 4.8 4.0  CL 88* 94* 95*  CO2 31 31 31   GLUCOSE 90 73 80  BUN 4* 4* 5*  CREATININE 0.31* 0.31* 0.37*  CALCIUM 8.2* 7.9* 7.8*  MG  --  1.7  --   PHOS  --  3.6  --    Liver Function Tests:  Recent Labs Lab 06/15/12 2007 06/16/12 0541 06/17/12 0513  AST 191* 188* 144*  ALT 120* 119* 103*  ALKPHOS 261* 237* 194*  BILITOT 0.8 0.9 0.8  PROT 6.5 5.4* 5.3*  ALBUMIN 2.6* 2.3*  2.1*   No results found for this basename: LIPASE, AMYLASE,  in the last 168 hours No results found for this basename: AMMONIA,  in the last 168 hours CBC:  Recent Labs Lab 06/15/12 2007 06/16/12 0541 06/17/12 0513  WBC 3.9* 4.0 4.1  NEUTROABS 2.2  --   --   HGB 16.3* 15.0 14.3  HCT 47.4* 46.1* 44.0  MCV 101.3* 104.5* 105.8*  PLT 68* 60* 59*   Cardiac Enzymes: No results found for this basename: CKTOTAL, CKMB, CKMBINDEX, TROPONINI,  in the last 168 hours BNP (last 3 results) No results found for this basename: PROBNP,  in the last 8760 hours CBG:  Recent Labs Lab 06/16/12 1355 06/16/12 1604  GLUCAP 94 82    Recent Results (from the past 240 hour(s))  WOUND CULTURE     Status: None   Collection Time    06/15/12  7:56 PM      Result Value Range Status   Specimen Description LEG   Final   Special Requests Normal   Final   Gram Stain     Final   Value: FEW WBC PRESENT, PREDOMINANTLY PMN     RARE SQUAMOUS EPITHELIAL CELLS PRESENT     FEW GRAM POSITIVE COCCI     IN CLUSTERS   Culture     Final   Value: MODERATE METHICILLIN RESISTANT STAPHYLOCOCCUS AUREUS     Note: RIFAMPIN AND GENTAMICIN SHOULD NOT BE USED AS SINGLE DRUGS FOR TREATMENT OF STAPH INFECTIONS.     Note: CRITICAL RESULT CALLED TO, READ BACK BY AND VERIFIED WITH: ELISE NACABUAG @ 9:34AM  06/18/12 BY DWEEKS   Report Status 06/18/2012 FINAL   Final   Organism ID, Bacteria METHICILLIN RESISTANT STAPHYLOCOCCUS AUREUS   Final  MRSA PCR SCREENING     Status: None   Collection Time    06/16/12 11:00 AM      Result Value Range Status   MRSA by PCR NEGATIVE  NEGATIVE Final   Comment:            The GeneXpert MRSA Assay (FDA     approved for NASAL specimens     only), is one component of a     comprehensive MRSA colonization     surveillance program. It is not     intended to diagnose MRSA     infection nor to guide or     monitor treatment for     MRSA infections.  TISSUE CULTURE     Status: None    Collection Time    06/16/12  2:59 PM      Result Value Range Status   Specimen Description ANKLE LEFT TISSUE   Final   Special Requests NONE ZOSYN AND VANCOMYCIN   Final   Gram Stain     Final   Value: MODERATE WBC PRESENT, PREDOMINANTLY PMN  RARE SQUAMOUS EPITHELIAL CELLS PRESENT     RARE YEAST   Culture     Final   Value: MODERATE STAPHYLOCOCCUS AUREUS     Note: RIFAMPIN AND GENTAMICIN SHOULD NOT BE USED AS SINGLE DRUGS FOR TREATMENT OF STAPH INFECTIONS.   Report Status PENDING   Incomplete  ANAEROBIC CULTURE     Status: None   Collection Time    06/16/12  2:59 PM      Result Value Range Status   Specimen Description ANKLE LEFT TISSUE   Final   Special Requests ZOSYN AND VANCOMYCIN   Final   Gram Stain PENDING   Incomplete   Culture     Final   Value: NO ANAEROBES ISOLATED; CULTURE IN PROGRESS FOR 5 DAYS   Report Status PENDING   Incomplete     Studies: US Abdomen Complete  06/17/2012  *RADIOLOGY REPORT*  Clinical Data:  42 year old female with elevated LFTs, hypernatremia and abdominal discomfort.  History of cholecystectomy.  ABDOMINAL ULTRASOUND COMPLETE  Comparison:  None.  Findings:  Gallbladder: The gallbladder is not visualized compatible with cholecystectomy.  Common Bile Duct:  There is no evidence of intrahepatic or extrahepatic biliary dilation. The CBD measures 5.3 mm in greatest diameter.  Liver: The hepatic parenchyma is slightly coarse and mildly increased in echogenicity.  No focal abnormalities are identified.  IVC:  Appears normal.  Pancreas:  Although the pancreas is difficult to visualize in its entirety, no focal pancreatic abnormality is identified.  Spleen:  Within normal limits in size and echotexture.  Right kidney:  The right kidney is normal in size and parenchymal echogenicity.  There is no evidence of solid mass, hydronephrosis or definite renal calculi.  The right kidney measures 11.7 cm.  Left kidney:  The left kidney is normal in size and parenchymal  echogenicity.  There is no evidence of solid mass, hydronephrosis or definite renal calculi.   The left kidney measures 12.1 cm.  Abdominal Aorta:  No abdominal aortic aneurysm identified.  There is no evidence of ascites. Very small bilateral pleural effusions are noted.  IMPRESSION: Slightly coarsened and echogenic hepatic parenchyma - question fatty infiltration.  Very small bilateral pleural effusions.  No evidence of biliary dilatation.   Original Report Authenticated By: Harmon Pier, M.D.     Scheduled Meds: . albuterol  2.5 mg Nebulization Q6H  . budesonide-formoterol  2 puff Inhalation BID  . feeding supplement  237 mL Oral BID BM  . folic acid  1 mg Oral Daily  . piperacillin-tazobactam (ZOSYN)  IV  3.375 g Intravenous Q8H  . sodium chloride  3 mL Intravenous Q12H  . thiamine  100 mg Oral Daily  . tiotropium  18 mcg Inhalation Daily  . vancomycin  500 mg Intravenous Q12H   Continuous Infusions:     Time spent: 25 minutes    Jakeel Starliper  [facility] Pager [phone] If 8PM-8AM, please contact night-coverage at [email or web address], password [facility] 06/18/2012, 1:15 PM  LOS: 3 days

## 2012-06-19 ENCOUNTER — Ambulatory Visit: Payer: Medicaid Other | Admitting: Internal Medicine

## 2012-06-19 LAB — TISSUE CULTURE

## 2012-06-19 LAB — COMPREHENSIVE METABOLIC PANEL
Alkaline Phosphatase: 195 U/L — ABNORMAL HIGH (ref 39–117)
BUN: 5 mg/dL — ABNORMAL LOW (ref 6–23)
CO2: 32 mEq/L (ref 19–32)
Chloride: 92 mEq/L — ABNORMAL LOW (ref 96–112)
GFR calc Af Amer: >90 mL/min (ref >90)
Glucose, Bld: 123 mg/dL — ABNORMAL HIGH (ref 70–99)
Potassium: 3.1 mEq/L — ABNORMAL LOW (ref 3.5–5.1)
Total Bilirubin: 0.7 mg/dL (ref 0.3–1.2)

## 2012-06-19 LAB — CBC
HCT: 42.6 % (ref 36.0–46.0)
Hemoglobin: 14.2 g/dL (ref 12.0–15.0)
RBC: 4.19 MIL/uL (ref 3.87–5.11)
WBC: 6 10*3/uL (ref 4.0–10.5)

## 2012-06-19 MED ORDER — OXYCODONE HCL 5 MG PO TABS: ORAL_TABLET | ORAL | Status: DC

## 2012-06-19 MED ORDER — SULFAMETHOXAZOLE-TRIMETHOPRIM 400-80 MG PO TABS
1.0000 | ORAL_TABLET | Freq: Two times a day (BID) | ORAL | Status: DC
  Administered 2012-06-19: 1 via ORAL
  Filled 2012-06-19 (×3): qty 1

## 2012-06-19 NOTE — Progress Notes (Signed)
Assessed pt's beginning of shift EKG strip from CMT done at 19:18. Interpretation showed SB with HR 55. Pt has been NSR on monitor, according to my best understanding. Was not notified by CMT that pt was SB. HR taken at 2245 was stable at 61, but BP was 92/52. Pt asymptomatic at time. Order in [facility] to notify MD when DBP is <60. Notified MD on call of pt's BP. Never got a page back. Assuming MD not concerned. Will continue to monitor pt. - Christell Faith, RN

## 2012-06-19 NOTE — Progress Notes (Addendum)
[facility] PROGRESS NOTE  ANNAELLE KASEL ZOX:[id number] DOB: [birth year] DOA: 06/15/2012 PCP: Dorrene German, MD  Brief narrative  42 year old female with headaches ataxia currently wheelchair-bound, history of COPD on home O2 with recent removal of hardware from her left ankle fracture (from ORIF of left ankle fracture 1 year back) presented to the ED with cellulitis and wound dehiscence of the left ankle.   Assessment/plan  left ankle cellulitis with hardware infection  patient had hardware placed in December of last year by Dr. Shelle Iron . Her screws were removed on 06/12/12. Patient noted redness and bleeding following this.Marland Kitchen MRI is suboptimal due to hardware presence  Started on IV Vanco and Zosyn. Culture growing moderate staph aureus. Sensitivity pending  Patient seen by orthopedics and taken to OR with removal of deep implants from lateral malleolus with irrigation and excisional debridement of left ankle wound including skin, subcutaneous tissue muscle and bone. Has a wound VAC in place.  She tolerated procedure well. Remains afebrile.  -Still requiring IV for pain control. -Culture growing MRSA sensitive to tender, Bactrim and doxycycline.. Blood culture negative  -Continue vancomycin and Zosyn and started patient on Bactrim to stop will  need at least 2 weeks of antibiotic -Orthopedic recommend to discharge patient on a wound VAC and followup with plastic surgery and wound care as outpatient. Followup with Dr. Shelle Iron in 2 weeks. - I Have spoken with Dr Kelly Splinter with plastic surgery and recommended to have patient followup on Monday next week (2/24) with her the wound care clinic. Patient should call the wound care clinic to set up an appointment.  Acute respiratory failure  Patient given a dose of Dilaudid in the ED and went into acute respiratory failure.. Reversed with narcan. She also has hx of COPD on home o2. Follows with Dr Vassie Loll.  continue with o2 via [place] and nebs  History of  smoking since age 49, now quit.   transaminitis  possibly in the setting of etoh use. She informs having 1-2 beers daily and denies heavy etoh use. Low platelets, high MCV ans transaminitis does suggest the possibility. Hepatitis panel negative. Ultrasound abdomen shows fatty changes. Counseled on EtOH cessation  -Repeat LFTs   malnutrition  Patient clinically appears quite malnourished low albumin noted. 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 denies worsening of symptoms   Code Status: full  Family Communication: none at bedside  Disposition plan: Home with home health PT and RN likely tomorrow if pain better controlled  Consultants:  orthopedics Procedures:  Irrigation and debridement of left ankle wound on 2/15 Antibiotics:  IV vancomycin and zosyn ( 2/14>>2/18)    Bactrim (2/18)  HPI/Subjective: Feels better but still complains of pain in her left leg  Objective: Filed Vitals:   06/19/12 0635 06/19/12 0835 06/19/12 1300 06/19/12 1420  BP: 96/57  98/61   Pulse: 69  65   Temp: 98.8 F (37.1 C)  98.7 F (37.1 C)   TempSrc: Oral  Oral   Resp: 18  15   Height:      Weight:      SpO2: 98% 96% 98% 97%    Intake/Output Summary (Last 24 hours) at 06/19/12 1630 Last data filed at 06/19/12 1455  Gross per 24 hour  Intake    850 ml  Output   1450 ml  Net   -600 ml   Filed Weights   06/15/12 2141 06/16/12 0057  Weight: 39.463 kg (87 lb) 40.2 kg (88 lb  10 oz)    Exam: General: middle aged female appears quite frail in NAD ,  HEENT: no pallor, moist oral mucosa  Cardiovascular: NS1&S2, no murmurs  Respiratory: clear to auscultation b/l, no added sounds  Abdomen: soft, NT ND BS+  Ext: warm, dressing over left ankle. Wound VAC in place  CNS: AAOX3, appears fatigued    Data Reviewed: Basic Metabolic Panel:  Recent Labs Lab 06/15/12 2007 06/16/12 0541 06/17/12 0513  NA 130* 133* 133*  K 5.1 4.8 4.0  CL 88* 94* 95*  CO2 31 31  31   GLUCOSE 90 73 80  BUN 4* 4* 5*  CREATININE 0.31* 0.31* 0.37*  CALCIUM 8.2* 7.9* 7.8*  MG  --  1.7  --   PHOS  --  3.6  --    Liver Function Tests:  Recent Labs Lab 06/15/12 2007 06/16/12 0541 06/17/12 0513  AST 191* 188* 144*  ALT 120* 119* 103*  ALKPHOS 261* 237* 194*  BILITOT 0.8 0.9 0.8  PROT 6.5 5.4* 5.3*  ALBUMIN 2.6* 2.3* 2.1*   No results found for this basename: LIPASE, AMYLASE,  in the last 168 hours No results found for this basename: AMMONIA,  in the last 168 hours CBC:  Recent Labs Lab 06/15/12 2007 06/16/12 0541 06/17/12 0513  WBC 3.9* 4.0 4.1  NEUTROABS 2.2  --   --   HGB 16.3* 15.0 14.3  HCT 47.4* 46.1* 44.0  MCV 101.3* 104.5* 105.8*  PLT 68* 60* 59*   Cardiac Enzymes: No results found for this basename: CKTOTAL, CKMB, CKMBINDEX, TROPONINI,  in the last 168 hours BNP (last 3 results) No results found for this basename: PROBNP,  in the last 8760 hours CBG:  Recent Labs Lab 06/16/12 1355 06/16/12 1604  GLUCAP 94 82    Recent Results (from the past 240 hour(s))  WOUND CULTURE     Status: None   Collection Time    06/15/12  7:56 PM      Result Value Range Status   Specimen Description LEG   Final   Special Requests Normal   Final   Gram Stain     Final   Value: FEW WBC PRESENT, PREDOMINANTLY PMN     RARE SQUAMOUS EPITHELIAL CELLS PRESENT     FEW GRAM POSITIVE COCCI     IN CLUSTERS   Culture     Final   Value: MODERATE METHICILLIN RESISTANT STAPHYLOCOCCUS AUREUS     Note: RIFAMPIN AND GENTAMICIN SHOULD NOT BE USED AS SINGLE DRUGS FOR TREATMENT OF STAPH INFECTIONS.     Note: CRITICAL RESULT CALLED TO, READ BACK BY AND VERIFIED WITH: ELISE NACABUAG @ 9:34AM  06/18/12 BY DWEEKS   Report Status 06/18/2012 FINAL   Final   Organism ID, Bacteria METHICILLIN RESISTANT STAPHYLOCOCCUS AUREUS   Final  MRSA PCR SCREENING     Status: None   Collection Time    06/16/12 11:00 AM      Result Value Range Status   MRSA by PCR NEGATIVE  NEGATIVE  Final   Comment:            The GeneXpert MRSA Assay (FDA     approved for NASAL specimens     only), is one component of a     comprehensive MRSA colonization     surveillance program. It is not     intended to diagnose MRSA     infection nor to guide or     monitor treatment for  MRSA infections.  TISSUE CULTURE     Status: None   Collection Time    06/16/12  2:59 PM      Result Value Range Status   Specimen Description ANKLE LEFT TISSUE   Final   Special Requests NONE ZOSYN AND VANCOMYCIN   Final   Gram Stain     Final   Value: MODERATE WBC PRESENT, PREDOMINANTLY PMN     RARE SQUAMOUS EPITHELIAL CELLS PRESENT     RARE YEAST   Culture     Final   Value: MODERATE METHICILLIN RESISTANT STAPHYLOCOCCUS AUREUS     Note: RIFAMPIN AND GENTAMICIN SHOULD NOT BE USED AS SINGLE DRUGS FOR TREATMENT OF STAPH INFECTIONS. CRITICAL RESULT CALLED TO, READ BACK BY AND VERIFIED WITH: KRISTIN PHILLIPS BY INGRAM A 2/18 810AM   Report Status 06/19/2012 FINAL   Final   Organism ID, Bacteria METHICILLIN RESISTANT STAPHYLOCOCCUS AUREUS   Final  ANAEROBIC CULTURE     Status: None   Collection Time    06/16/12  2:59 PM      Result Value Range Status   Specimen Description ANKLE LEFT TISSUE   Final   Special Requests ZOSYN AND VANCOMYCIN   Final   Gram Stain PENDING   Incomplete   Culture     Final   Value: NO ANAEROBES ISOLATED; CULTURE IN PROGRESS FOR 5 DAYS   Report Status PENDING   Incomplete  CULTURE, BLOOD (ROUTINE X 2)     Status: None   Collection Time    06/18/12 11:48 AM      Result Value Range Status   Specimen Description BLOOD LEFT HAND   Final   Special Requests BOTTLES DRAWN AEROBIC AND ANAEROBIC 4CC   Final   Culture  Setup Time 06/18/2012 19:00   Final   Culture     Final   Value:        BLOOD CULTURE RECEIVED NO GROWTH TO DATE CULTURE WILL BE HELD FOR 5 DAYS BEFORE ISSUING A FINAL NEGATIVE REPORT   Report Status PENDING   Incomplete  CULTURE, BLOOD (ROUTINE X 2)     Status:  None   Collection Time    06/18/12 11:48 AM      Result Value Range Status   Specimen Description BLOOD LEFT ARM   Final   Special Requests BOTTLES DRAWN AEROBIC AND ANAEROBIC 5CC   Final   Culture  Setup Time 06/18/2012 19:00   Final   Culture     Final   Value:        BLOOD CULTURE RECEIVED NO GROWTH TO DATE CULTURE WILL BE HELD FOR 5 DAYS BEFORE ISSUING A FINAL NEGATIVE REPORT   Report Status PENDING   Incomplete     Studies: No results found.  Scheduled Meds: . albuterol  2.5 mg Nebulization Q6H  . budesonide-formoterol  2 puff Inhalation BID  . dextromethorphan-guaiFENesin  1 tablet Oral BID  . feeding supplement  237 mL Oral BID BM  . folic acid  1 mg Oral Daily  . sodium chloride  3 mL Intravenous Q12H  . sulfamethoxazole-trimethoprim  1 tablet Oral Q12H  . thiamine  100 mg Oral Daily  . tiotropium  18 mcg Inhalation Daily   Continuous Infusions:     Time spent: 25 MINUTES    Eddie North  [facility] Pager [phone] If 8PM-8AM, please contact night-coverage at [email or web address], password [facility] 06/19/2012, 4:30 PM  LOS: 4 days

## 2012-06-19 NOTE — Progress Notes (Signed)
Physical Therapy Treatment Patient Details Name: [name] MRN: [id number] DOB: 1970-07-07 Today's Date: 06/19/2012 Time: 4098-1191 PT Time Calculation (min): 23 min  PT Assessment / Plan / Recommendation Comments on Treatment Session  Family present during session so performed family education on safe handling/transfer tech.  Brother was able to assist pt to EOB and to [facility] under direction PT. Assisted pt of BSC to recliner.  Instructed on elavation and positioning. Instructed to negociate furniture if they can such that pt transfers to her R side.  Pt on cont O2.    Follow Up Recommendations  Supervision/Assistance - 24 hour     Does the patient have the potential to tolerate intense rehabilitation     Barriers to Discharge        Equipment Recommendations  None recommended by PT;Other (comment) (has all equipment from prior.)    Recommendations for Other Services    Frequency Min 3X/week   Plan      Precautions / Restrictions Precautions Precautions: Fall Precaution Comments: has wound vac on left leg Restrictions Weight Bearing Restrictions: Yes LLE Weight Bearing: Non weight bearing   Pertinent Vitals/Pain C/o 5/10 with act reposotioned    Mobility  Bed Mobility Bed Mobility: Supine to Sit Supine to Sit: 3: Mod assist Details for Bed Mobility Assistance: Assist for LLE out of bed with cues for hand placement and technique.  Noted ataxic like movements in LLE when moving to EOB.  plus increased time. Transfers Transfers: Sit to Stand;Stand to Sit;Stand Pivot Transfers Sit to Stand: 3: Mod assist;From bed;From toilet Stand to Sit: 3: Mod assist;To toilet;To chair/3-in-1 Details for Transfer Assistance: Family present and plan to take pt home, so performed family education on proper handling tech's. Instructed family to move furniture and if able to have pt always transfer to her Right 1/4 turn with hands placed on fixed surface and pivot on R LE without even  using RW.  Ambulation/Gait Ambulation/Gait Assistance: Not tested (comment)                                                         PT Goals                                                 progressing     Visit Information  Last PT Received On: 06/19/12 Assistance Needed: +2    Subjective Data      Cognition       Balance     End of Session PT - End of Session Activity Tolerance: Patient limited by pain Patient left: in chair;with call bell/phone within reach Nurse Communication: Mobility status   Felecia Shelling  PTA [facility]  Acute  Rehab Pager      [phone]

## 2012-06-19 NOTE — Anesthesia Postprocedure Evaluation (Signed)
Anesthesia Post Note  Patient: [name]  Procedure(s) Performed: Procedure(s) (LRB): HARDWARE REMOVAL (Left) INCISION AND DRAINAGE (Left) APPLICATION OF WOUND VAC (Left)  Anesthesia type: General  Patient location: PACU  Post pain: Pain level controlled  Post assessment: Post-op Vital signs reviewed  Last Vitals:  Filed Vitals:   06/19/12 0635  BP: 96/57  Pulse: 69  Temp: 37.1 C  Resp: 18    Post vital signs: Reviewed  Level of consciousness: sedated  Complications: No apparent anesthesia complications

## 2012-06-19 NOTE — Care Management Note (Addendum)
    Page 1 of 2   06/21/2012     3:12:40 PM   CARE MANAGEMENT NOTE 06/21/2012  Patient:  [name], [name]   Account Number:  [id number]  Date Initiated:  06/19/2012  Documentation initiated by:  Lanier Clam  Subjective/Objective Assessment:   ADMITTED W/LLE WOUND DEHISCENCE,L LWR EXT CELLULITIS.     Action/Plan:   FROM HOME.HAS PCP,PHARMACY.USED [facility] IN PAST.   Anticipated DC Date:  06/21/2012   Anticipated DC Plan:  HOME W HOME HEALTH SERVICES      DC Planning Services  CM consult      Choice offered to / List presented to:  C-1 Patient        HH arranged  HH-1 RN  HH-2 PT  IV Antibiotics      HH agency  [facility]   Status of service:  Completed, signed off Medicare Important Message given?   (If response is "NO", the following Medicare IM given date fields will be blank) Date Medicare IM given:   Date Additional Medicare IM given:    Discharge Disposition:  HOME W HOME HEALTH SERVICES  Per UR Regulation:  Reviewed for med. necessity/level of care/duration of stay  If discussed at Long Length of Stay Meetings, dates discussed:   06/21/2012    Comments:  06/21/12 Francesca Strome RN,BSN NCM [phone] [facility] SUSAN REP AWARE OF D/C, & ORDERS FOR HHRN-WOUND VAC CARE,IV ABX,VANCOMYCIN IV PROTOCAL.[facility] WILL CHANGE WOUND VAC TO THEIR WOUND VAC TODAY PRIOR D/C.PATIENT/FRIEND-RECEIVED INITIAL INSTRUCTION ON WOUND VAC BY [facility] PAM (IV REP)HHRN/PT ORDERED.PICC LINE PLACED.WOUND CARE CLINIC APPT SET.PATIENT COMPREHENDED.  06/20/12 Urban Naval RN,BSN NCM [phone] .[facility] FOLLOWING FOR HHRN-WOUND VAC.IF IV ABX LONG TERM NEEDED WILL NEED DOSE/FREQ/DURATION.IF PICC LINE,PLEASE INCLUDE PICC LINE CARE PER PROTOCAL ALONG W/HHRN-WOUND VAC CARE/DSG CHANGES/INSTRUCTION.  06/19/12 Annica Marinello RN,BSN NCM [phone] [facility] CHOSEN FOR HH-?NEG PRESSURE WOUND THERAPY.WILL NEED FORM SIGNED BY MD.RECOMMEND HHRN-MONITORING/INSTRUCTION WOUND VAC,DSG CHANGES.

## 2012-06-19 NOTE — Progress Notes (Signed)
Subjective: 3 Days Post-Op Procedure(s) (LRB): HARDWARE REMOVAL (Left) INCISION AND DRAINAGE (Left) APPLICATION OF WOUND VAC (Left) Patient reports pain as 4 on 0-10 scale.    Objective: Vital signs in last 24 hours: Temp:  [98.2 F (36.8 C)-98.8 F (37.1 C)] 98.7 F (37.1 C) (02/18 1300) Pulse Rate:  [61-69] 65 (02/18 1300) Resp:  [15-18] 15 (02/18 1300) BP: (86-98)/(53-64) 98/61 mmHg (02/18 1300) SpO2:  [94 %-98 %] 98 % (02/18 1300)  Intake/Output from previous day: 02/17 0701 - 02/18 0700 In: 934 [P.O.:834; IV Piggyback:100] Out: 2200 [Urine:1325; Emesis/NG output:500; Drains:75; Stool:300] Intake/Output this shift: Total I/O In: 240 [P.O.:240] Out: -    Recent Labs  06/17/12 0513  HGB 14.3    Recent Labs  06/17/12 0513  WBC 4.1  RBC 4.16  HCT 44.0  PLT 59*    Recent Labs  06/17/12 0513  NA 133*  K 4.0  CL 95*  CO2 31  BUN 5*  CREATININE 0.37*  GLUCOSE 80  CALCIUM 7.8*   No results found for this basename: LABPT, INR,  in the last 72 hours  Neurologically intact Neurovascular intact Intact pulses distally Dorsiflexion/Plantar flexion intact Compartment soft  Assessment/Plan: 3 Days Post-Op Procedure(s) (LRB): HARDWARE REMOVAL (Left) INCISION AND DRAINAGE (Left) APPLICATION OF WOUND VAC (Left) Continue ABX therapy due to Culture taken of surgical site during joint revision Needs Ab reconciled for home PO or IV Needs home VAC and plastics F/U. F/U dr Shelle Iron 2wks. ASA at home No tylenol. Charlotte Brafford C 06/19/2012, 1:50 PM

## 2012-06-20 ENCOUNTER — Inpatient Hospital Stay (HOSPITAL_COMMUNITY): Payer: Medicaid Other

## 2012-06-20 DIAGNOSIS — A4902 Methicillin resistant Staphylococcus aureus infection, unspecified site: Secondary | ICD-10-CM

## 2012-06-20 DIAGNOSIS — T847XXA Infection and inflammatory reaction due to other internal orthopedic prosthetic devices, implants and grafts, initial encounter: Secondary | ICD-10-CM

## 2012-06-20 DIAGNOSIS — Y831 Surgical operation with implant of artificial internal device as the cause of abnormal reaction of the patient, or of later complication, without mention of misadventure at the time of the procedure: Secondary | ICD-10-CM

## 2012-06-20 LAB — BASIC METABOLIC PANEL
Calcium: 7.9 mg/dL — ABNORMAL LOW (ref 8.4–10.5)
Creatinine, Ser: 0.62 mg/dL (ref 0.50–1.10)
GFR calc non Af Amer: >90 mL/min (ref >90)
Sodium: 137 mEq/L (ref 135–145)

## 2012-06-20 LAB — GLUCOSE, CAPILLARY
Glucose-Capillary: 85 mg/dL (ref 70–99)
Glucose-Capillary: 101 mg/dL — ABNORMAL HIGH (ref 70–99)

## 2012-06-20 MED ORDER — POTASSIUM CHLORIDE 10 MEQ/100ML IV SOLN
10.0000 meq | INTRAVENOUS | Status: AC
  Administered 2012-06-20 (×2): 10 meq via INTRAVENOUS
  Filled 2012-06-20 (×2): qty 100

## 2012-06-20 MED ORDER — VANCOMYCIN HCL 500 MG IV SOLR
500.0000 mg | Freq: Two times a day (BID) | INTRAVENOUS | Status: DC
  Administered 2012-06-20 – 2012-06-21 (×3): 500 mg via INTRAVENOUS
  Filled 2012-06-20 (×4): qty 500

## 2012-06-20 MED ORDER — POTASSIUM CHLORIDE CRYS ER 20 MEQ PO TBCR
40.0000 meq | EXTENDED_RELEASE_TABLET | Freq: Once | ORAL | Status: AC
  Administered 2012-06-20: 40 meq via ORAL
  Filled 2012-06-20: qty 2

## 2012-06-20 MED ORDER — SODIUM CHLORIDE 0.9 % IV SOLN
INTRAVENOUS | Status: DC
  Administered 2012-06-20: 21:00:00 via INTRAVENOUS

## 2012-06-20 NOTE — Consult Note (Signed)
[facility]  Total days of antibiotics 6        Day 6 vanco        Day 5 piptazo.- d/c on 2/19        Day 1 bactrim- d/c on 2/19       Reason for Consult: MRSA hardware infection from ORIF of Left ankle    Referring Physician: Tat  Principal Problem:   Wound dehiscence, surgical Active Problems:   COPD   Hyponatremia   Cellulitis and abscess   Opiate overdose   MRSA (methicillin resistant staph aureus) culture positive   Hardware complicating wound infection    HPI: [name] is a 42 y.o. female with hx of DM, who sustained bimalleolar ankle fracture on 04/12/12 from rotational type of injury. She was admitted on 12/23 for ORIF, with hardware/screws implant. Given cefazolin and clindamycin preoperatively. She did well until 2/11 when she had some screws removed due to working out of skin. She subseqeuntly it appeared that screws were working out of skin, and were removed. She presented to [facility] on 2/14 since she noticed increasing redness and bleeding to her ankle. She was diagnosed with cellulitis, possibly surgical site infection started empirically on vanco and piptazo. She went to OR on 2/15 for removal of deep implant, I x D and placement of wound vac. Cultures from OR on 2/15 grew MRSA (Oxa R, clinda S, Rif S, tetra S, bactrim S, vanco S mic 1) as well as wound cx from 2/14.afebrile since admit.   Past Medical History  Diagnosis Date  . Arthritis   . COPD (chronic obstructive pulmonary disease)   . Respiratory failure, acute   . Diabetes mellitus     borderline  . Hx of toe surgery     X 2  . Ankle fracture     LEFT  . On supplemental oxygen therapy     2 L continuous    Allergies: No Known Allergies  MEDICATIONS: . albuterol  2.5 mg Nebulization Q6H  . budesonide-formoterol  2 puff Inhalation BID  . dextromethorphan-guaiFENesin  1 tablet Oral BID  . feeding supplement  237 mL Oral BID BM  . folic acid  1 mg Oral Daily  . sodium  chloride  3 mL Intravenous Q12H  . thiamine  100 mg Oral Daily  . tiotropium  18 mcg Inhalation Daily  . vancomycin  500 mg Intravenous Q12H    History  Substance Use Topics  . Smoking status: Former Smoker -- 0.50 packs/day for 10 years    Quit date: 01/30/2010  . Smokeless tobacco: Never Used  . Alcohol Use: No    Family History  Problem Relation Age of Onset  . Diabetes    . Asthma      Review of Systems  Constitutional: Negative for fever, chills, diaphoresis, activity change, appetite change, fatigue and unexpected weight change.  HENT: Negative for congestion, sore throat, rhinorrhea, sneezing, trouble swallowing and sinus pressure.  Eyes: Negative for photophobia and visual disturbance.  Respiratory: Negative for cough, chest tightness, shortness of breath, wheezing and stridor.  Cardiovascular: Negative for chest pain, palpitations and leg swelling.  Gastrointestinal: Negative for nausea, vomiting, abdominal pain, diarrhea, constipation, blood in stool, abdominal distention and anal bleeding.  Genitourinary: Negative for dysuria, hematuria, flank pain and difficulty urinating.  Musculoskeletal: Negative for myalgias, back pain, joint swelling, arthralgias and gait problem.  Skin: Negative for color change, pallor, rash and wound.  Neurological: Negative  for dizziness, tremors, weakness and light-headedness.  Hematological: Negative for adenopathy. Does not bruise/bleed easily.  Psychiatric/Behavioral: Negative for behavioral problems, confusion, sleep disturbance, dysphoric mood, decreased concentration and agitation.     OBJECTIVE: Temp:  [97.9 F (36.6 C)-99 F (37.2 C)] 97.9 F (36.6 C) (02/19 1415) Pulse Rate:  [72-82] 72 (02/19 1415) Resp:  [14-16] 16 (02/19 0420) BP: (94-106)/(60-70) 106/65 mmHg (02/19 1415) SpO2:  [97 %-99 %] 97 % (02/19 1415) Physical Exam  Constitutional:  oriented to person, place, and time. appears well-developed and well-nourished. No  distress.  HENT:  Mouth/Throat: Oropharynx is clear and moist. No oropharyngeal exudate.  Cardiovascular: Normal rate, regular rhythm and normal heart sounds. Exam reveals no gallop and no friction rub.  No murmur heard.  Pulmonary/Chest: Effort normal and breath sounds normal. No respiratory distress. s no wheezes.  Abdominal: Soft. Bowel sounds are normal.  exhibits no distension. There is no tenderness.  Lymphadenopathy:  no cervical adenopathy.  Neurological: He is alert and oriented to person, place, and time.  Skin: Skin is warm and dry. No rash noted. No erythema.  Ext: left leg wound vac in place   LABS: Results for orders placed during the hospital encounter of 06/15/12 (from the past 48 hour(s))  CBC     Status: Abnormal   Collection Time    06/19/12  4:03 PM      Result Value Range   WBC 6.0  4.0 - 10.5 K/uL   RBC 4.19  3.87 - 5.11 MIL/uL   Hemoglobin 14.2  12.0 - 15.0 g/dL   HCT 16.1  09.6 - 04.5 %   MCV 101.7 (*) 78.0 - 100.0 fL   MCH 33.9  26.0 - 34.0 pg   MCHC 33.3  30.0 - 36.0 g/dL   RDW 40.9  81.1 - 91.4 %   Platelets 80 (*) 150 - 400 K/uL   Comment: REPEATED TO VERIFY     CONSISTENT WITH PREVIOUS RESULT     LARGE PLATELETS PRESENT  COMPREHENSIVE METABOLIC PANEL     Status: Abnormal   Collection Time    06/19/12  5:54 PM      Result Value Range   Sodium 134 (*) 135 - 145 mEq/L   Potassium 3.1 (*) 3.5 - 5.1 mEq/L   Chloride 92 (*) 96 - 112 mEq/L   CO2 32  19 - 32 mEq/L   Glucose, Bld 123 (*) 70 - 99 mg/dL   BUN 5 (*) 6 - 23 mg/dL   Creatinine, Ser 7.82  0.50 - 1.10 mg/dL   Calcium 7.8 (*) 8.4 - 10.5 mg/dL   Total Protein 5.5 (*) 6.0 - 8.3 g/dL   Albumin 2.0 (*) 3.5 - 5.2 g/dL   AST 53 (*) 0 - 37 U/L   ALT 67 (*) 0 - 35 U/L   Alkaline Phosphatase 195 (*) 39 - 117 U/L   Total Bilirubin 0.7  0.3 - 1.2 mg/dL   GFR calc non Af Amer >90  >90 mL/min   GFR calc Af Amer >90  >90 mL/min   Comment:            The eGFR has been calculated     using the CKD EPI  equation.     This calculation has not been     validated in all clinical     situations.     eGFR's persistently     <90 mL/min signify     possible Chronic Kidney Disease.  BASIC METABOLIC  PANEL     Status: Abnormal   Collection Time    06/20/12  5:03 AM      Result Value Range   Sodium 137  135 - 145 mEq/L   Potassium 2.9 (*) 3.5 - 5.1 mEq/L   Chloride 96  96 - 112 mEq/L   CO2 32  19 - 32 mEq/L   Glucose, Bld 93  70 - 99 mg/dL   BUN 4 (*) 6 - 23 mg/dL   Creatinine, Ser 6.04  0.50 - 1.10 mg/dL   Calcium 7.9 (*) 8.4 - 10.5 mg/dL   GFR calc non Af Amer >90  >90 mL/min   GFR calc Af Amer >90  >90 mL/min   Comment:            The eGFR has been calculated     using the CKD EPI equation.     This calculation has not been     validated in all clinical     situations.     eGFR's persistently     <90 mL/min signify     possible Chronic Kidney Disease.  GLUCOSE, CAPILLARY     Status: None   Collection Time    06/20/12  7:17 AM      Result Value Range   Glucose-Capillary 85  70 - 99 mg/dL  GLUCOSE, CAPILLARY     Status: Abnormal   Collection Time    06/20/12 11:23 AM      Result Value Range   Glucose-Capillary 101 (*) 70 - 99 mg/dL    MICRO: 5/40 blood cx x 2 NGTD 2/15 tissue cx MRSA 2/14 wound cx MRSA mrsa screen NEGATIVe  IMAGING: No results found.  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 Currently on vanco.  1) will order xray of left ankle to see if any remaining hardware (screws, wires, plates) if so, we will need to add rifampin 300mg  BID or if she can tolerate 600mg  daily.   2) will need to be on vancomycin for 4- 6 wks, then we will convert to oral antibiotics for total of 6 months   3) please check sed rate and crp  4) have home health check weekly labs of cbc, cmp, vanco trough, and weekly dressing change  5) we will set up appt  in ID clinic at [facility] in 2 wks and 6 wks  Janiyla Long B. Drue Second MD MPH [facility] [phone]

## 2012-06-20 NOTE — Progress Notes (Signed)
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 Shelle Iron Dec 2013 . -screws were removed on 06/12/12 -MRI outpt suboptimal due to hardware presence  -initial IV Vanco and Zosyn empiric -OR with removal of deep implants(screws) from lateral malleolus with irrigation and excisional debridement of left ankle wound including skin, subcutaneous tissue muscle and bone -VAC inplace  -Culture =MRSA  -Orthopedic recommend to discharge patient on a wound VAC and followup with plastic surgery and wound care as outpatient. Followup with Dr. Shelle Iron in 2 weeks.  - Dr. Barnett Abu spoke with Dr. Eloisa [facility] surgery whom recommended to have patient followup on Monday next week (2/24) with her the wound care clinic. Patient should call the wound care clinic to set up an appointment.  Acute respiratory failure  Patient given a dose of Dilaudid in the ED and went into acute respiratory failure.. Reversed with narcan.  -hx of COPD on home o2. Follows with Dr Vassie Loll.  -continue with o2 via [employer] [facility] ne[facility]  -History of smoking since age 35, now quit.  transaminitis  -likely fatty liver superimposed on Etoh use possibly in the setting of etoh use. She informs having 1-2 beers daily and denies heavy etoh use.   -Hepatitis panel negative. Ultrasound abdomen shows fatty changes. -Counseled on EtOH cessation  -Trend LFTs  malnutrition  Patient clinically appears quite malnourished low albumin noted. Nutrition consulted  -check prealbumin Frederick's ataxia  patient is wheelchair bound. denies worsening of  symptoms Hypokalemia -Replete -Check magnesium      Family Communication:   Pt at beside Disposition Plan:   Home when medically stable  nsultants:  orthopedics Procedures:  Irrigation and debridement of left ankle wound on 2/15 Antibiotics:  IV vancomycin and zosyn ( 2/14>>2/18)  Bactrim (2/18)    Procedures/Studies: Dg Chest 2 View  06/07/2012  *RADIOLOGY REPORT*  Clinical Data: Chronic bronchitis with wheezing  CHEST - 2 VIEW  Comparison: July 12, 2011.  Findings:  There is underlying emphysema.  There are bilateral nipple shadows.  There is no edema or consolidation.  The heart size and pulmonary vascularity normal.  No adenopathy.  There is thoracolumbar levoscoliosis.   IMPRESSION: Underlying emphysema.  No edema or consolidation.   Original Report Authenticated By: Bretta Bang, M.D.    US Abdomen Complete  06/17/2012  *RADIOLOGY REPORT*  Clinical Data:  42 year old female with elevated LFTs, hypernatremia and abdominal discomfort.  History of cholecystectomy.  ABDOMINAL ULTRASOUND COMPLETE  Comparison:  None.  Findings:  Gallbladder: The gallbladder is not visualized compatible with cholecystectomy.  Common Bile Duct:  There is no evidence of intrahepatic or extrahepatic biliary dilation. The CBD measures 5.3 mm in greatest diameter.  Liver: The hepatic parenchyma is slightly coarse and mildly increased in echogenicity.  No focal abnormalities are identified.  IVC:  Appears normal.  Pancreas:  Although the pancreas is difficult to visualize in its entirety, no focal pancreatic abnormality is identified.  Spleen:  Within normal limits in size and echotexture.  Right kidney:  The right kidney is normal in size and parenchymal echogenicity.  There is no evidence of solid mass, hydronephrosis or definite renal calculi.  The right kidney measures 11.7  cm.  Left kidney:  The left kidney is normal in size and parenchymal echogenicity.  There is no evidence of solid mass, hydronephrosis  or definite renal calculi.   The left kidney measures 12.1 cm.  Abdominal Aorta:  No abdominal aortic aneurysm identified.  There is no evidence of ascites. Very small bilateral pleural effusions are noted.  IMPRESSION: Slightly coarsened and echogenic hepatic parenchyma - question fatty infiltration.  Very small bilateral pleural effusions.  No evidence of biliary dilatation.   Original Report Authenticated By: Harmon Pier, M.D.    Ct Tibia Fibula Left W Contrast  06/15/2012  *RADIOLOGY REPORT*  Clinical Data: Evaluate for infection; status post internal fixation of left ankle fracture.  CT OF THE LEFT TIBIA AND FIBULA WITH CONTRAST  Contrast: 80mL OMNIPAQUE IOHEXOL 300 MG/ML  SOLN  Comparison: Fluoroscopic C-arm images of the left ankle performed 04/23/2012  Findings: Evaluation for osteomyelitis is significantly limited on CT; no definite significant osseous erosions are seen, though this is difficult to differentiate from postoperative change.  MRI would likely also be suboptimal, given underlying hardware, unless areas of concern are situated distal to the hardware.  No abscess is identified.  Soft tissue swelling is noted along the left lower leg and ankle, with mild soft tissue swelling along the dorsum of the midfoot.  There is a focal soft tissue defect overlying the distal aspect of the fibular plate, with minimal air tracking superiorly along the fibular plate.  This appears to expose the underlying hardware.  The visualized vasculature is grossly unremarkable in appearance. The visualized flexor extensor tendons are grossly unremarkable. Mild soft tissue inflammation is noted about the peroneus longus and brevis.  A small knee joint effusion is noted.  The knee joint is otherwise grossly unremarkable in appearance.  An ankle joint effusion is seen.  The fracture lines through the distal fibula and medial malleolus are still seen, though transfixed by hardware as expected.  There is no evidence of  loosening or hardware fracture.  There is diffuse osteopenia of visualized osseous structures.  IMPRESSION:  1.  No significant osseous erosions are seen, though evaluation for osteomyelitis is significantly limited on CT.  MRI would likely also be suboptimal, given underlying hardware, unless areas of concern are situated distal to the hardware. 2.  No evidence of abscess. 3.  Focal soft tissue defect overlying the distal aspect of the fibular plate, with minimal air tracking superiorly along the fibular plate.  The soft tissue defect appears to expose the underlying hardware. 4.  No evidence of loosening with respect to the tibial and fibular hardware; diffuse osteopenia of visualized osseous structures. Fracture lines are still evident.  5.  Ankle joint effusion seen; small knee joint effusion noted. 6.  Suggestion of mild soft tissue inflammation about the peroneus longus and brevis. 7.  Soft tissue swelling along the left lower leg and ankle, with mild soft tissue swelling along the dorsum of the midfoot.   Original Report Authenticated By: Tonia Ghent, M.D.          Subjective: Patient denies any fevers, chills, chest pain, shortness breath, nausea, vomiting, diarrhea, abdominal pain. She complains of some pain in her left ankle. No rashes.  Objective: Filed Vitals:   06/19/12 2043 06/19/12 2135 06/20/12 0204 06/20/12 0420  BP:  94/62  99/60  Pulse:  74  82  Temp:  99 F (37.2 C)  98.1 F (36.7 C)  TempSrc:  Oral  Oral  Resp:  14  16  Height:      Weight:      SpO2: 98% 98% 97% 97%    Intake/Output Summary (Last 24 hours) at 06/20/12 8657 Last data filed at 06/20/12 0656  Gross per 24 hour  Intake    610 ml  Output   1580 ml  Net   -970 ml   Weight change:  Exam:   General:  Pt is alert, follows commands appropriately, not in acute distress  HEENT: No icterus, No thrush, poor dentition [place]/AT  Cardiovascular: RRR, S1/S2, no rubs, no gallops  Respiratory: Diminished  breath sounds bilaterally. No wheezes or rhonchi.  Abdomen: Soft/+BS, non tender, non distended, no guarding  Extremities: No edema, No lymphangitis; left lower extremity wound VAC intact  Data Reviewed: Basic Metabolic Panel:  Recent Labs Lab 06/15/12 2007 06/16/12 0541 06/17/12 0513 06/19/12 1754 06/20/12 0503  NA 130* 133* 133* 134* 137  K 5.1 4.8 4.0 3.1* 2.9*  CL 88* 94* 95* 92* 96  CO2 31 31 31  32 32  GLUCOSE 90 73 80 123* 93  BUN 4* 4* 5* 5* 4*  CREATININE 0.31* 0.31* 0.37* 0.64 0.62  CALCIUM 8.2* 7.9* 7.8* 7.8* 7.9*  MG  --  1.7  --   --   --   PHOS  --  3.6  --   --   --    Liver Function Tests:  Recent Labs Lab 06/15/12 2007 06/16/12 0541 06/17/12 0513 06/19/12 1754  AST 191* 188* 144* 53*  ALT 120* 119* 103* 67*  ALKPHOS 261* 237* 194* 195*  BILITOT 0.8 0.9 0.8 0.7  PROT 6.5 5.4* 5.3* 5.5*  ALBUMIN 2.6* 2.3* 2.1* 2.0*   No results found for this basename: LIPASE, AMYLASE,  in the last 168 hours No results found for this basename: AMMONIA,  in the last 168 hours CBC:  Recent Labs Lab 06/15/12 2007 06/16/12 0541 06/17/12 0513 06/19/12 1603  WBC 3.9* 4.0 4.1 6.0  NEUTROABS 2.2  --   --   --   HGB 16.3* 15.0 14.3 14.2  HCT 47.4* 46.1* 44.0 42.6  MCV 101.3* 104.5* 105.8* 101.7*  PLT 68* 60* 59* 80*   Cardiac Enzymes: No results found for this basename: CKTOTAL, CKMB, CKMBINDEX, TROPONINI,  in the last 168 hours BNP: No components found with this basename: POCBNP,  CBG:  Recent Labs Lab 06/16/12 1355 06/16/12 1604 06/20/12 0717  GLUCAP 94 82 85    Recent Results (from the past 240 hour(s))  WOUND CULTURE     Status: None   Collection Time    06/15/12  7:56 PM      Result Value Range Status   Specimen Description LEG   Final   Special Requests Normal   Final   Gram Stain     Final   Value: FEW WBC PRESENT, PREDOMINANTLY PMN     RARE SQUAMOUS EPITHELIAL CELLS PRESENT     FEW GRAM POSITIVE COCCI     IN CLUSTERS   Culture     Final    Value: MODERATE METHICILLIN RESISTANT STAPHYLOCOCCUS AUREUS     Note: RIFAMPIN AND GENTAMICIN SHOULD NOT BE USED AS SINGLE DRUGS FOR TREATMENT OF STAPH INFECTIONS.     Note: CRITICAL RESULT CALLED TO, READ BACK BY AND VERIFIED WITH: ELISE NACABUAG @ 9:34AM  06/18/12 BY DWEEKS   Report Status 06/18/2012 FINAL   Final   Organism ID, Bacteria METHICILLIN RESISTANT STAPHYLOCOCCUS AUREUS   Final  MRSA PCR SCREENING  Status: None   Collection Time    06/16/12 11:00 AM      Result Value Range Status   MRSA by PCR NEGATIVE  NEGATIVE Final   Comment:            The GeneXpert MRSA Assay (FDA     approved for NASAL specimens     only), is one component of a     comprehensive MRSA colonization     surveillance program. It is not     intended to diagnose MRSA     infection nor to guide or     monitor treatment for     MRSA infections.  TISSUE CULTURE     Status: None   Collection Time    06/16/12  2:59 PM      Result Value Range Status   Specimen Description ANKLE LEFT TISSUE   Final   Special Requests NONE ZOSYN AND VANCOMYCIN   Final   Gram Stain     Final   Value: MODERATE WBC PRESENT, PREDOMINANTLY PMN     RARE SQUAMOUS EPITHELIAL CELLS PRESENT     RARE YEAST   Culture     Final   Value: MODERATE METHICILLIN RESISTANT STAPHYLOCOCCUS AUREUS     Note: RIFAMPIN AND GENTAMICIN SHOULD NOT BE USED AS SINGLE DRUGS FOR TREATMENT OF STAPH INFECTIONS. CRITICAL RESULT CALLED TO, READ BACK BY AND VERIFIED WITH: KRISTIN PHILLIPS BY INGRAM A 2/18 810AM   Report Status 06/19/2012 FINAL   Final   Organism ID, Bacteria METHICILLIN RESISTANT STAPHYLOCOCCUS AUREUS   Final  ANAEROBIC CULTURE     Status: None   Collection Time    06/16/12  2:59 PM      Result Value Range Status   Specimen Description ANKLE LEFT TISSUE   Final   Special Requests ZOSYN AND VANCOMYCIN   Final   Gram Stain PENDING   Incomplete   Culture     Final   Value: NO ANAEROBES ISOLATED; CULTURE IN PROGRESS FOR 5 DAYS   Report  Status PENDING   Incomplete  CULTURE, BLOOD (ROUTINE X 2)     Status: None   Collection Time    06/18/12 11:48 AM      Result Value Range Status   Specimen Description BLOOD LEFT HAND   Final   Special Requests BOTTLES DRAWN AEROBIC AND ANAEROBIC 4CC   Final   Culture  Setup Time 06/18/2012 19:00   Final   Culture     Final   Value:        BLOOD CULTURE RECEIVED NO GROWTH TO DATE CULTURE WILL BE HELD FOR 5 DAYS BEFORE ISSUING A FINAL NEGATIVE REPORT   Report Status PENDING   Incomplete  CULTURE, BLOOD (ROUTINE X 2)     Status: None   Collection Time    06/18/12 11:48 AM      Result Value Range Status   Specimen Description BLOOD LEFT ARM   Final   Special Requests BOTTLES DRAWN AEROBIC AND ANAEROBIC 5CC   Final   Culture  Setup Time 06/18/2012 19:00   Final   Culture     Final   Value:        BLOOD CULTURE RECEIVED NO GROWTH TO DATE CULTURE WILL BE HELD FOR 5 DAYS BEFORE ISSUING A FINAL NEGATIVE REPORT   Report Status PENDING   Incomplete     Scheduled Meds: . albuterol  2.5 mg Nebulization Q6H  . budesonide-formoterol  2 puff Inhalation BID  .  dextromethorphan-guaiFENesin  1 tablet Oral BID  . feeding supplement  237 mL Oral BID BM  . folic acid  1 mg Oral Daily  . sodium chloride  3 mL Intravenous Q12H  . sulfamethoxazole-trimethoprim  1 tablet Oral Q12H  . thiamine  100 mg Oral Daily  . tiotropium  18 mcg Inhalation Daily   Continuous Infusions:    Patricia Starzyk, DO  [facility] Pager [phone]  If 7PM-7AM, please contact night-coverage [email or web address] Password [facility] 06/20/2012, 8:23 AM   LOS: 5 days

## 2012-06-20 NOTE — Progress Notes (Signed)
ANTIBIOTIC CONSULT NOTE - RESUME VANCOMYCIN Indication: cellulitis with hardware  No Known Allergies  Patient Measurements: Height: 5' (152.4 cm) Weight: 88 lb 10 oz (40.2 kg) IBW/kg (Calculated) : 45.5  Vital Signs: Temp: 98.1 F (36.7 C) (02/19 0420) Temp src: Oral (02/19 0420) BP: 99/60 mmHg (02/19 0420) Pulse Rate: 82 (02/19 0420) Intake/Output from previous day: 02/18 0701 - 02/19 0700 In: 610 [P.O.:460; IV Piggyback:150] Out: 1580 [Urine:1580] Intake/Output from this shift:    Labs:  Recent Labs  06/19/12 1603 06/19/12 1754 06/20/12 0503  WBC 6.0  --   --   HGB 14.2  --   --   PLT 80*  --   --   CREATININE  --  0.64 0.62   Estimated Creatinine Clearance: 59.9 ml/min (by C-G formula based on Cr of 0.62).  >100 ml/min/1.46m2 (normalized)  Antibiotics: 2/14 >> Vanc >> 2/18 2/15 >> Zosyn >> 2/18 2/18 >> Bactrim PO >> 2/19 2/19 >> Resume Vanc >>  Cultures: 2/14 leg wound: MRSA 2/15 L ankle tissue: MRSA 2/17 blood x2: NGtd  Assessment:  42 year old female s/p ankle fracture and ORIF 04/23/2012. On 06/12/2012, patient had 2 screws removed bc they were working their way through the skin.  Patient presented 2/14 with bleeding and severe pain from site. IV antibiotics started as above.  Today is Day#6 antibiotic therapy, MD changing back to IV vancomycin and consulting ID for possible prolonged therapy.  Tmax 99, WBC wnl.  Scr low at 0.37 on admit, but increased to 0.64 over past 48hr. Appears to maintaining UOP.  Wound culture growing MRSA s/p I&D with hardware removal 2/15  Blood cultures obtained to r/o bacteremia, negative so far  Vanc trough slightly supratherapeutic (21.9) on 750mg  IV q12h on 2/17, dose decreased to 500mg  q12h, but stopped before new SS trough could be obtained.  Goal of Therapy:  Vancomycin troughs 15-20 mcg/ml since +MRSA  Plan:   Resume Vanc to 500 mg IV q12h Check trough at steady state Follow up renal function &  cultures  Pharmacy will f/u daily  Loralee Pacas, PharmD, BCPS Pager: [phone] 06/20/2012 8:55 AM

## 2012-06-20 NOTE — Progress Notes (Signed)
Subjective: 4 Days Post-Op Procedure(s) (LRB): HARDWARE REMOVAL (Left) INCISION AND DRAINAGE (Left) APPLICATION OF WOUND VAC (Left) Patient reports pain as 3 on 0-10 scale.    Objective: Vital signs in last 24 hours: Temp:  [98.1 F (36.7 C)-99 F (37.2 C)] 98.3 F (36.8 C) (02/19 0800) Pulse Rate:  [65-82] 74 (02/19 0800) Resp:  [14-16] 16 (02/19 0420) BP: (94-103)/(60-70) 103/70 mmHg (02/19 0800) SpO2:  [97 %-99 %] 97 % (02/19 0910)  Intake/Output from previous day: 02/18 0701 - 02/19 0700 In: 610 [P.O.:460; IV Piggyback:150] Out: 1580 [Urine:1580] Intake/Output this shift: Total I/O In: 459 [P.O.:459] Out: 150 [Urine:150]   Recent Labs  06/19/12 1603  HGB 14.2    Recent Labs  06/19/12 1603  WBC 6.0  RBC 4.19  HCT 42.6  PLT 80*    Recent Labs  06/19/12 1754 06/20/12 0503  NA 134* 137  K 3.1* 2.9*  CL 92* 96  CO2 32 32  BUN 5* 4*  CREATININE 0.64 0.62  GLUCOSE 123* 93  CALCIUM 7.8* 7.9*   No results found for this basename: LABPT, INR,  in the last 72 hours  Neurologically intact No cellulitis present Compartment soft rewrapped Ace. Skin viable. NoDVT.  Assessment/Plan: 4 Days Post-Op Procedure(s) (LRB): HARDWARE REMOVAL (Left) INCISION AND DRAINAGE (Left) APPLICATION OF WOUND VAC (Left) Discharge home with home health when IV Ab set up. Nutrition. ASA at home. WBAT in boot or splint.  Travon Crochet C 06/20/2012, 12:01 PM

## 2012-06-20 NOTE — Consult Note (Signed)
WOC consult Note Reason for Consult:WOUnd V.A.C. Dressing Change Wound type:Chronic, surgical.  Dressing changed without incident.  negative pressure achieved (contiuous) and patient tolerated well.  Wound bed with 90% red granulation tissue and small white tendon nearly entirely covered with healthy tissue.No bleeding today. One piece of black foam used in wound care. I will not follow.  Please re-consult if needed. Thanks, Ladona Mow, MSN, RN, [facility], CWOCN [phone])

## 2012-06-20 NOTE — Progress Notes (Signed)
PT Cancellation Note  Patient Details Name: [name] MRN: [id number] DOB: February 14, 1971   Cancelled Treatment:    Reason Eval/Treat Not Completed: Fatigue/lethargy limiting ability to participate Pt having wound vac changed prior to lunch and upon checking back with pt, she reports she is too tired for mobility today.   Nyimah Shadduck,KATHrine E 06/20/2012, 1:49 PM Pager: [phone]

## 2012-06-21 DIAGNOSIS — M009 Pyogenic arthritis, unspecified: Secondary | ICD-10-CM

## 2012-06-21 LAB — MAGNESIUM: Magnesium: 1.1 mg/dL — ABNORMAL LOW (ref 1.5–2.5)

## 2012-06-21 LAB — BASIC METABOLIC PANEL
BUN: 6 mg/dL (ref 6–23)
CO2: 32 mEq/L (ref 19–32)
Calcium: 8.1 mg/dL — ABNORMAL LOW (ref 8.4–10.5)
Creatinine, Ser: 0.67 mg/dL (ref 0.50–1.10)

## 2012-06-21 LAB — CBC
HCT: 44 % (ref 36.0–46.0)
MCH: 33.7 pg (ref 26.0–34.0)
MCHC: 33.2 g/dL (ref 30.0–36.0)
RDW: 15.3 % (ref 11.5–15.5)

## 2012-06-21 MED ORDER — SODIUM CHLORIDE 0.9 % IJ SOLN: 10.0000 mL | INTRAMUSCULAR | Status: DC | PRN

## 2012-06-21 MED ORDER — RIFAMPIN 300 MG PO CAPS: 300.0000 mg | ORAL_CAPSULE | Freq: Two times a day (BID) | ORAL | Status: DC

## 2012-06-21 MED ORDER — UNABLE TO FIND: Status: DC

## 2012-06-21 MED ORDER — ALBUTEROL SULFATE (5 MG/ML) 0.5% IN NEBU: 2.5000 mg | INHALATION_SOLUTION | Freq: Four times a day (QID) | RESPIRATORY_TRACT | Status: DC | PRN

## 2012-06-21 MED ORDER — VANCOMYCIN HCL 500 MG IV SOLR: 500.0000 mg | Freq: Two times a day (BID) | INTRAVENOUS | Status: DC

## 2012-06-21 MED ORDER — RIFAMPIN 300 MG PO CAPS
300.0000 mg | ORAL_CAPSULE | Freq: Two times a day (BID) | ORAL | Status: DC
  Administered 2012-06-21: 300 mg via ORAL
  Filled 2012-06-21 (×2): qty 1

## 2012-06-21 MED ORDER — RIFAMPIN 300 MG PO CAPS: 300.0000 mg | ORAL_CAPSULE | Freq: Every day | ORAL | Status: DC

## 2012-06-21 NOTE — Discharge Summary (Signed)
Physician Discharge Summary  [name] QMV:[id number] DOB: 01-04-1971 DOA: 06/15/2012  PCP: Dorrene German, MD  Admit date: 06/15/2012 Discharge date: 06/21/2012  Recommendations for Outpatient Follow-up:  1. Pt will need to follow up with PCP in 2 weeks post discharge 2. Please obtain BMP to evaluate electrolytes and kidney function 3. Please also check CBC to evaluate Hg and Hct levels 4. Follow up with Dr. Kelly Splinter 06/25/12 5. Follow up with ID, Dr. Drue Second in 2 weeks, call for appt 6. Follow up with orthopedics, Dr. Shelle Iron in 2 weeks, call for appt  Discharge Diagnoses:  Principal Problem:   Wound dehiscence, surgical Active Problems:   COPD   Hyponatremia   Cellulitis and abscess   Opiate overdose   MRSA (methicillin resistant staph aureus) culture positive   Hardware complicating wound infection left ankle septic arthritis and hardware infection  -culture from screws=MRSA  -because of hardware infection, will need IV abx for longer duration  -ID, Dr. Drue Second saw pt. And recommended 4-6wks IV abx with rifampin -repeat xray of right ankle showed retained screw (06/20/12) for which rifampin was added -will need monitoring of LFTs -Home health for wound care, PT, and IV abx was set up for patient -D/C bactrim  -restart vancomycin--last vanco (2/17) trough 21.9  -PICC line placed 06/21/12 -hardware placed in December of last year by Dr. Shelle Iron Dec 2013 . -screws were removed on 06/16/12,  follow up xray of ankle still revealed one retained screw which will alter abx management -MRI outpt suboptimal due to hardware presence  -initial IV Vanco and Zosyn empiric subsequently switched to Bactrim, but due to deep seated nature of infection, IV vancomycin was restarted and PICC line was placed for outpt IV abx -OR with removal of deep implants(screws) from lateral malleolus with irrigation and excisional debridement of left ankle wound including skin, subcutaneous tissue muscle and bone  (06/16/12) -VAC inplace  -Orthopedic recommend to discharge patient on a wound VAC and followup with plastic surgery and wound care as outpatient. Followup with Dr. Shelle Iron in 2 weeks.  - Dr. Barnett Abu spoke with Dr. Eloisa Northern surgery whom recommended to have patient followup on Monday next week (2/24) with her the wound care clinic. Patient should call the wound care clinic to set up an appointment.  -[facility] was set up for the patient to manage the wound VAC as well as the PICC line and IV antibiotics Acute respiratory failure  Patient given a dose of Dilaudid in the ED and went into acute respiratory failure.. Reversed with narcan.  -hx of COPD on home o2. Follows with Dr Vassie Loll.  -continue with o2 via [place] and nebs--remained stable for rest of the hospitalization -History of smoking since age 80, now quit.  transaminitis  -likely fatty liver superimposed on Etoh use  possibly in the setting of etoh use. She informs having 1-2 beers daily and denies heavy etoh use.  -Hepatitis panel negative. Ultrasound abdomen shows fatty changes. -Counseled on EtOH cessation  -Trend LFTs--needs monitoring while on rifampin malnutrition  Patient clinically appears quite malnourished low albumin noted. Nutrition consulted and glucerna was added -patient will need continued supplementation as outpt Frederick's ataxia  patient is wheelchair bound. denies worsening of symptoms  Hypokalemia  -Repleted -Check magnesium    nsultants:  Orthopedics-Dr. Shelle Iron ID-Dr. Drue Second Procedures:  Irrigation and debridement of left ankle wound on 2/15 Antibiotics:  IV vancomycin and zosyn ( 2/14>>2/18)  Bactrim (2/18) IV vanco restart 06/20/12>>>   Discharge Condition: stable  Disposition:  Follow-up Information   Follow up with BEANE,JEFFREY C, MD In 2 weeks.   Contact information:   [address] [place] [address] [phone]       Follow up with [facility], DO On 06/25/2012.  (@ 8a)    Contact information:   [facility] [address] [place]  [phone] [id number]      Follow up with Judyann Munson, MD In 2 weeks.   Contact information:   [address] [place] [address] [phone]       Follow up with Dorrene German, MD.   Contact information:   [address] [place] [address] [phone]       Diet: heart healthy Wt Readings from Last 3 Encounters:  06/16/12 40.2 kg (88 lb 10 oz)  06/16/12 40.2 kg (88 lb 10 oz)  04/23/12 38.8 kg (85 lb 8.6 oz)    History of present illness:   42 y.o. female, with a past medical history significant for left ankle malleolar fracture status post surgery with hardware placement on 04/23/2012 and removal of the screws last Tuesday presenting after missing an appointment with her position today due to the weather. The patient noted more bleeding from her ankle wound with redness and pain. No fever or chills. Dr. Shelle Iron from orthopedics was consulted from the ED. During her stay , patient received 1 mg of Dilaudid due to severe pain , after which she desated and she required Narcan given in the emergency room. Now the patient is more alert and awake and will be admitted for IV antibiotics, orthopedics consult, and monitoring after this episode.     Discharge Exam: Filed Vitals:   06/21/12 0422  BP: 121/77  Pulse: 71  Temp: 98 F (36.7 C)  Resp: 16   Filed Vitals:   06/20/12 2025 06/20/12 2110 06/21/12 0422 06/21/12 0921  BP: 88/51 113/85 121/77   Pulse: 83 106 71   Temp: 98.1 F (36.7 C)  98 F (36.7 C)   TempSrc: Oral  Oral   Resp: 16  16   Height:      Weight:      SpO2: 97%  91% 91%   General: A&O x 3, NAD, pleasant, cooperative Cardiovascular: RRR, no rub, no gallop, no S3 Respiratory: Basilar crackles. Diminished breath sounds at the bases. No wheezes or rhonchi. Good air movement. Abdomen:soft, nontender, nondistended, positive bowel sounds Extremities: No edema,  No lymphangitis, no petechiae; right ankle wound VAC intact--no crepitance, necrosis  Discharge Instructions      Discharge Orders   Future Orders Complete By Expires     Diet - low sodium heart healthy  As directed     Discharge instructions  As directed     Comments:      Call Dr. Judyann Munson to schedule followup appointment for 2 weeks from now Follow up with Dr. Kelly Splinter on 06/25/12    Increase activity slowly  As directed         Medication List    TAKE these medications       albuterol (2.5 MG/3ML) 0.083% nebulizer solution  Commonly known as:  PROVENTIL  Take 2.5 mg by nebulization every 4 (four) hours as needed. For shortness of breath     VENTOLIN HFA 108 (90 BASE) MCG/ACT inhaler  Generic drug:  albuterol  Inhale 2 puffs into the lungs every 4 (four) hours as needed.     budesonide-formoterol 160-4.5 MCG/ACT  inhaler  Commonly known as:  SYMBICORT  Inhale 2 puffs into the lungs 2 (two) times daily.     HYDROcodone-acetaminophen 5-325 MG per tablet  Commonly known as:  NORCO  Take 1-2 tablets by mouth every 6 (six) hours as needed for pain.     oxyCODONE 5 MG immediate release tablet  Commonly known as:  Oxy IR/ROXICODONE  1 PO every four hours prn     rifampin 300 MG capsule  Commonly known as:  RIFADIN  Take 1 capsule (300 mg total) by mouth every 12 (twelve) hours.     sodium chloride 0.9 % SOLN 100 mL with vancomycin 500 MG SOLR 500 mg  Inject 500 mg into the vein every 12 (twelve) hours.     tiotropium 18 MCG inhalation capsule  Commonly known as:  SPIRIVA  Place 18 mcg into inhaler and inhale daily.     UNABLE TO FIND  IV Vancomycin to be managed per [facility] Protocol.  Vancomycin IV x 41 days         The results of significant diagnostics from this hospitalization (including imaging, microbiology, ancillary and laboratory) are listed below for reference.    Significant Diagnostic Studies: Dg Chest 2 View  06/07/2012  *RADIOLOGY  REPORT*  Clinical Data: Chronic bronchitis with wheezing  CHEST - 2 VIEW  Comparison: July 12, 2011.  Findings:  There is underlying emphysema.  There are bilateral nipple shadows.  There is no edema or consolidation.  The heart size and pulmonary vascularity normal.  No adenopathy.  There is thoracolumbar levoscoliosis.   IMPRESSION: Underlying emphysema.  No edema or consolidation.   Original Report Authenticated By: Bretta Bang, M.D.    Dg Ankle 2 Views Left  06/20/2012  *RADIOLOGY REPORT*  Clinical Data: Evaluate for retained hardware in the left ankle  LEFT ANKLE - 2 VIEW  Comparison: Left leg CT, 06/15/2012  Findings: A single fixation screw extends from inferior to superior and an oblique line across the medial malleolus.  This is stable when compared to the recent prior CT.  The distal fibular fixation plate and its associated screws have been removed.  The distal fibular fracture line has healed.  On the lateral view, there is a density that suggests a screw over the plantar soft tissues below the heel.  This may reflect a superimposed shadow from the fiberglass splint.  No other evidence of a radiopaque foreign body.  The ankle mortise is normally aligned.  IMPRESSION: Density projects below the calcaneus on the lateral view only. This is not as dense as metal.  It is most likely a fold in the fiberglass splint. Persistent a single screw crosses the medial malleolus unchanged from the prior CT.  No other orthopedic hardware.   Original Report Authenticated By: Amie Portland, M.D.    US Abdomen Complete  06/17/2012  *RADIOLOGY REPORT*  Clinical Data:  42 year old female with elevated LFTs, hypernatremia and abdominal discomfort.  History of cholecystectomy.  ABDOMINAL ULTRASOUND COMPLETE  Comparison:  None.  Findings:  Gallbladder: The gallbladder is not visualized compatible with cholecystectomy.  Common Bile Duct:  There is no evidence of intrahepatic or extrahepatic biliary dilation. The CBD  measures 5.3 mm in greatest diameter.  Liver: The hepatic parenchyma is slightly coarse and mildly increased in echogenicity.  No focal abnormalities are identified.  IVC:  Appears normal.  Pancreas:  Although the pancreas is difficult to visualize in its entirety, no focal pancreatic abnormality is identified.  Spleen:  Within normal limits in size and echotexture.  Right kidney:  The right kidney is normal in size and parenchymal echogenicity.  There is no evidence of solid mass, hydronephrosis or definite renal calculi.  The right kidney measures 11.7 cm.  Left kidney:  The left kidney is normal in size and parenchymal echogenicity.  There is no evidence of solid mass, hydronephrosis or definite renal calculi.   The left kidney measures 12.1 cm.  Abdominal Aorta:  No abdominal aortic aneurysm identified.  There is no evidence of ascites. Very small bilateral pleural effusions are noted.  IMPRESSION: Slightly coarsened and echogenic hepatic parenchyma - question fatty infiltration.  Very small bilateral pleural effusions.  No evidence of biliary dilatation.   Original Report Authenticated By: Harmon Pier, M.D.    Ct Tibia Fibula Left W Contrast  06/15/2012  *RADIOLOGY REPORT*  Clinical Data: Evaluate for infection; status post internal fixation of left ankle fracture.  CT OF THE LEFT TIBIA AND FIBULA WITH CONTRAST  Contrast: 80mL OMNIPAQUE IOHEXOL 300 MG/ML  SOLN  Comparison: Fluoroscopic C-arm images of the left ankle performed 04/23/2012  Findings: Evaluation for osteomyelitis is significantly limited on CT; no definite significant osseous erosions are seen, though this is difficult to differentiate from postoperative change.  MRI would likely also be suboptimal, given underlying hardware, unless areas of concern are situated distal to the hardware.  No abscess is identified.  Soft tissue swelling is noted along the left lower leg and ankle, with mild soft tissue swelling along the dorsum of the midfoot.   There is a focal soft tissue defect overlying the distal aspect of the fibular plate, with minimal air tracking superiorly along the fibular plate.  This appears to expose the underlying hardware.  The visualized vasculature is grossly unremarkable in appearance. The visualized flexor extensor tendons are grossly unremarkable. Mild soft tissue inflammation is noted about the peroneus longus and brevis.  A small knee joint effusion is noted.  The knee joint is otherwise grossly unremarkable in appearance.  An ankle joint effusion is seen.  The fracture lines through the distal fibula and medial malleolus are still seen, though transfixed by hardware as expected.  There is no evidence of loosening or hardware fracture.  There is diffuse osteopenia of visualized osseous structures.  IMPRESSION:  1.  No significant osseous erosions are seen, though evaluation for osteomyelitis is significantly limited on CT.  MRI would likely also be suboptimal, given underlying hardware, unless areas of concern are situated distal to the hardware. 2.  No evidence of abscess. 3.  Focal soft tissue defect overlying the distal aspect of the fibular plate, with minimal air tracking superiorly along the fibular plate.  The soft tissue defect appears to expose the underlying hardware. 4.  No evidence of loosening with respect to the tibial and fibular hardware; diffuse osteopenia of visualized osseous structures. Fracture lines are still evident.  5.  Ankle joint effusion seen; small knee joint effusion noted. 6.  Suggestion of mild soft tissue inflammation about the peroneus longus and brevis. 7.  Soft tissue swelling along the left lower leg and ankle, with mild soft tissue swelling along the dorsum of the midfoot.   Original Report Authenticated By: Tonia Ghent, M.D.      Microbiology: Recent Results (from the past 240 hour(s))  WOUND CULTURE     Status: None   Collection Time    06/15/12  7:56 PM      Result Value Range Status  Specimen Description LEG   Final   Special Requests Normal   Final   Gram Stain     Final   Value: FEW WBC PRESENT, PREDOMINANTLY PMN     RARE SQUAMOUS EPITHELIAL CELLS PRESENT     FEW GRAM POSITIVE COCCI     IN CLUSTERS   Culture     Final   Value: MODERATE METHICILLIN RESISTANT STAPHYLOCOCCUS AUREUS     Note: RIFAMPIN AND GENTAMICIN SHOULD NOT BE USED AS SINGLE DRUGS FOR TREATMENT OF STAPH INFECTIONS.     Note: CRITICAL RESULT CALLED TO, READ BACK BY AND VERIFIED WITH: ELISE NACABUAG @ 9:34AM  06/18/12 BY DWEEKS   Report Status 06/18/2012 FINAL   Final   Organism ID, Bacteria METHICILLIN RESISTANT STAPHYLOCOCCUS AUREUS   Final  MRSA PCR SCREENING     Status: None   Collection Time    06/16/12 11:00 AM      Result Value Range Status   MRSA by PCR NEGATIVE  NEGATIVE Final   Comment:            The GeneXpert MRSA Assay (FDA     approved for NASAL specimens     only), is one component of a     comprehensive MRSA colonization     surveillance program. It is not     intended to diagnose MRSA     infection nor to guide or     monitor treatment for     MRSA infections.  TISSUE CULTURE     Status: None   Collection Time    06/16/12  2:59 PM      Result Value Range Status   Specimen Description ANKLE LEFT TISSUE   Final   Special Requests NONE ZOSYN AND VANCOMYCIN   Final   Gram Stain     Final   Value: MODERATE WBC PRESENT, PREDOMINANTLY PMN     RARE SQUAMOUS EPITHELIAL CELLS PRESENT     RARE YEAST   Culture     Final   Value: MODERATE METHICILLIN RESISTANT STAPHYLOCOCCUS AUREUS     Note: RIFAMPIN AND GENTAMICIN SHOULD NOT BE USED AS SINGLE DRUGS FOR TREATMENT OF STAPH INFECTIONS. CRITICAL RESULT CALLED TO, READ BACK BY AND VERIFIED WITH: KRISTIN PHILLIPS BY INGRAM A 2/18 810AM   Report Status 06/19/2012 FINAL   Final   Organism ID, Bacteria METHICILLIN RESISTANT STAPHYLOCOCCUS AUREUS   Final  ANAEROBIC CULTURE     Status: None   Collection Time    06/16/12  2:59 PM       Result Value Range Status   Specimen Description ANKLE LEFT TISSUE   Final   Special Requests ZOSYN AND VANCOMYCIN   Final   Gram Stain     Final   Value: MODERATE WBC PRESENT, PREDOMINANTLY PMN     RARE SQUAMOUS EPITHELIAL CELLS PRESENT     RARE YEAST   Culture NO ANAEROBES ISOLATED   Final   Report Status 06/21/2012 FINAL   Final  CULTURE, BLOOD (ROUTINE X 2)     Status: None   Collection Time    06/18/12 11:48 AM      Result Value Range Status   Specimen Description BLOOD LEFT HAND   Final   Special Requests BOTTLES DRAWN AEROBIC AND ANAEROBIC 4CC   Final   Culture  Setup Time 06/18/2012 19:00   Final   Culture     Final   Value:        BLOOD CULTURE RECEIVED NO GROWTH TO  DATE CULTURE WILL BE HELD FOR 5 DAYS BEFORE ISSUING A FINAL NEGATIVE REPORT   Report Status PENDING   Incomplete  CULTURE, BLOOD (ROUTINE X 2)     Status: None   Collection Time    06/18/12 11:48 AM      Result Value Range Status   Specimen Description BLOOD LEFT ARM   Final   Special Requests BOTTLES DRAWN AEROBIC AND ANAEROBIC 5CC   Final   Culture  Setup Time 06/18/2012 19:00   Final   Culture     Final   Value:        BLOOD CULTURE RECEIVED NO GROWTH TO DATE CULTURE WILL BE HELD FOR 5 DAYS BEFORE ISSUING A FINAL NEGATIVE REPORT   Report Status PENDING   Incomplete     Labs: Basic Metabolic Panel:  Recent Labs Lab 06/16/12 0541 06/17/12 0513 06/19/12 1754 06/20/12 0503 06/21/12 0545  NA 133* 133* 134* 137 138  K 4.8 4.0 3.1* 2.9* 4.0  CL 94* 95* 92* 96 97  CO2 31 31 32 32 32  GLUCOSE 73 80 123* 93 81  BUN 4* 5* 5* 4* 6  CREATININE 0.31* 0.37* 0.64 0.62 0.67  CALCIUM 7.9* 7.8* 7.8* 7.9* 8.1*  MG 1.7  --   --   --  1.1*  PHOS 3.6  --   --   --   --    Liver Function Tests:  Recent Labs Lab 06/15/12 2007 06/16/12 0541 06/17/12 0513 06/19/12 1754  AST 191* 188* 144* 53*  ALT 120* 119* 103* 67*  ALKPHOS 261* 237* 194* 195*  BILITOT 0.8 0.9 0.8 0.7  PROT 6.5 5.4* 5.3* 5.5*  ALBUMIN  2.6* 2.3* 2.1* 2.0*   No results found for this basename: LIPASE, AMYLASE,  in the last 168 hours No results found for this basename: AMMONIA,  in the last 168 hours CBC:  Recent Labs Lab 06/15/12 2007 06/16/12 0541 06/17/12 0513 06/19/12 1603 06/21/12 0545  WBC 3.9* 4.0 4.1 6.0 5.5  NEUTROABS 2.2  --   --   --   --   HGB 16.3* 15.0 14.3 14.2 14.6  HCT 47.4* 46.1* 44.0 42.6 44.0  MCV 101.3* 104.5* 105.8* 101.7* 101.6*  PLT 68* 60* 59* 80* 110*   Cardiac Enzymes: No results found for this basename: CKTOTAL, CKMB, CKMBINDEX, TROPONINI,  in the last 168 hours BNP: No components found with this basename: POCBNP,  CBG:  Recent Labs Lab 06/16/12 1355 06/16/12 1604 06/20/12 0717 06/20/12 1123 06/20/12 1724  GLUCAP 94 82 85 101* 88    Time coordinating discharge:  Greater than 30 minutes  Signed:  Crystalynn Mcinerney, DO [facility] Pager: [phone] 06/21/2012, 11:15 AM

## 2012-06-21 NOTE — Progress Notes (Signed)
Nutrition Brief Note  Body mass index is 17.31 kg/(m^2). Patient meets criteria for Underweight based on current BMI.   Current diet order is CHO modified, patient is consuming approximately 75% of meals at this time. Pt reports having a good appetite and drinking 2 Glucerna shakes daily. Pt states she usually weighs between 88 and 97 lbs. Pt was weighed at 88 lbs on 2/15. Recommended sugar-free Carnation Instant breakfast when pt goes home as well as good PO intake. Labs and medications reviewed.   No nutrition interventions warranted at this time. If nutrition issues arise, please consult RD.   Ian Malkin RD, LDN Inpatient Clinical Dietitian Pager: [phone] After Hours Pager: [phone]

## 2012-06-21 NOTE — Progress Notes (Signed)
This Clinical research associate gave d/c instructions to patient and to her husband before her d/c at 1536,they verbalized understanding,dsg to L lower leg intact.NT accompanied patient to the front lobby via wheelchair, stable on d/c.-Makinzie Considine [employer] RN

## 2012-06-21 NOTE — Progress Notes (Signed)
Physical Therapy Treatment Patient Details Name: WHITLEE SLUDER MRN: [id number] DOB: [birth year] Today's Date: 06/21/2012 Time: 0932-1006 PT Time Calculation (min): 34 min  PT Assessment / Plan / Recommendation Comments on Treatment Session  Pt planstoD/C to home at W/C level.  So assisted OOB to [facility] to void then to W/C to practice W/C mobility and safety. Instructed pt to always try to make R turns (stronger side) during stand pivot transfers.  Instructed pt on proper hand placement w/o using AD.  Pt very unsteady and advised not toperform without assistance.  Pt propelled W/C in hallway then returned to herroom.  Assisted back to bed with pivot transfer to her R.  Pt has all equipment and her O2is suppliedby [employer].    Follow Up Recommendations  Supervision/Assistance - 24 hour     Does the patient have the potential to tolerate intense rehabilitation     Barriers to Discharge        Equipment Recommendations  None recommended by PT;Other (comment) (has all equipment)    Recommendations for Other Services    Frequency Min 3X/week   Plan      Precautions / Restrictions Precautions Precautions: Fall Precaution Comments: Home O2, LLE wound Vac Restrictions Weight Bearing Restrictions: Yes LLE Weight Bearing: Non weight bearing   Pertinent Vitals/Pain C/o "some"L ankle pain Elevation/repositioned    Mobility  Bed Mobility Bed Mobility: Supine to Sit Supine to Sit: 4: Min guard Details for Bed Mobility Assistance: increased time Transfers Transfers: Stand Pivot Transfers Stand to Sit: 3: Mod assist;To chair/3-in-1 Details for Transfer Assistance: Instructed pt to always try tomake R turns with BSC/WCclose and flush up against bed minimizing space as well as 1/4 turn pivot on R LE w/o using RW.  Pt required 50% VC's on propertech and handplacement.  Very unsteady and ataxic, so advised pt toonly attempt with assist.  Naval architect Mobility: Yes Wheelchair  Assistance: 4: Min Best boy: Both upper extremities Distance: 220' with increased time and 25% VC'son safety with turns and locking brakes immediatelywhen she stops.     Exercises      PT Goals                                                progressing    Visit Information  Last PT Received On: 06/21/12 Assistance Needed: +1    Subjective Data  Subjective: I think I am okay Patient Stated Goal: home   Cognition    impaired safety    Balance   poor  End of Session PT - End of Session Equipment Utilized During Treatment: Gait belt Activity Tolerance: Patient limited by fatigue Patient left: in bed;with call bell/phone within reach   Felecia Shelling  PTA [facility]  Acute  Rehab Pager      [phone]

## 2012-06-21 NOTE — Progress Notes (Signed)
Peripherally Inserted Central Catheter/Midline Placement  The IV Nurse has discussed with the patient and/or persons authorized to consent for the patient, the purpose of this procedure and the potential benefits and risks involved with this procedure.  The benefits include less needle sticks, lab draws from the catheter and patient may be discharged home with the catheter.  Risks include, but not limited to, infection, bleeding, blood clot (thrombus formation), and puncture of an artery; nerve damage and irregular heat beat.  Alternatives to this procedure were also discussed.  PICC/Midline Placement Documentation        Timmothy Sours 06/21/2012, 12:57 PM

## 2012-06-24 LAB — CULTURE, BLOOD (ROUTINE X 2)

## 2012-06-25 ENCOUNTER — Encounter (HOSPITAL_BASED_OUTPATIENT_CLINIC_OR_DEPARTMENT_OTHER): Payer: Medicaid Other

## 2012-06-28 ENCOUNTER — Telehealth: Payer: Self-pay | Admitting: *Deleted

## 2012-06-28 NOTE — Telephone Encounter (Signed)
Tonya from [facility] called on this patient. She advised they have had no contact with this patient since she was discharged. They have tried and could not get in touch with her they drove to her house on several occasions and could not get her. She has not had labs, wound vac changed or anything. She just wanted the doctor to know.

## 2012-06-29 ENCOUNTER — Telehealth: Payer: Self-pay | Admitting: *Deleted

## 2012-06-29 NOTE — Telephone Encounter (Signed)
Nurse called to advise that the patient Vanc trough was 39 but her serum Creat was .5. She advised it may not be an accurate measure as the patient had just dosed herself prior to the nurse visit. She advised that patient is very non compliant and they are encouraging her to take the medication as instructed. She advised they will redo the labs Monday and get a full panel and fax asap. Per Dr Drue Second request advised them to stop the Vanc at the first sign of the patient being toxic and call the office. She advised she understands and will try to get the patient to do the right thing and be compliant.

## 2012-07-03 ENCOUNTER — Telehealth: Payer: Self-pay | Admitting: *Deleted

## 2012-07-03 ENCOUNTER — Telehealth: Payer: Self-pay | Admitting: Internal Medicine

## 2012-07-03 NOTE — Telephone Encounter (Signed)
Patient having supratherapeutic vancomycin level, concern for accumulation and AKI. We will change her to daptomycin 8mg /kg/day, will get baseline bMP,CK.

## 2012-07-03 NOTE — Telephone Encounter (Signed)
Amy with [facility] called and advised this patient Vanc trough was 32 and Creat is 0.4. She advised the patient to stop her Vanc until she speaks to Dr Drue Second. She is not sure why the numbers are the way they are and when she talks to the patient about how she is taking the meds she explains it correctly so she knows the patient understands she is just not sure the patient is doing the right thing. After speaking with Dr Drue Second was advised to fax new orders on this patient. The patient was changed to once a day dosing of Daptomycin at 8 mg/kg daily until 07/28/12. She wants a baseline CK and weight for verification and she wants weekly BMP and CK. I called Amy and advised her of the changes and faxed the order also.

## 2012-07-05 ENCOUNTER — Encounter: Payer: Self-pay | Admitting: Internal Medicine

## 2012-07-05 ENCOUNTER — Ambulatory Visit (INDEPENDENT_AMBULATORY_CARE_PROVIDER_SITE_OTHER): Payer: Medicaid Other | Admitting: Internal Medicine

## 2012-07-05 VITALS — BP 108/78 | HR 108 | Temp 97.5°F | Ht 60.0 in | Wt 87.0 lb

## 2012-07-05 DIAGNOSIS — T889XXS Complication of surgical and medical care, unspecified, sequela: Secondary | ICD-10-CM

## 2012-07-05 DIAGNOSIS — T847XXS Infection and inflammatory reaction due to other internal orthopedic prosthetic devices, implants and grafts, sequela: Secondary | ICD-10-CM

## 2012-07-05 LAB — COMPREHENSIVE METABOLIC PANEL
Albumin: 3.1 g/dL — ABNORMAL LOW (ref 3.5–5.2)
Alkaline Phosphatase: 142 U/L — ABNORMAL HIGH (ref 39–117)
BUN: 5 mg/dL — ABNORMAL LOW (ref 6–23)
CO2: 35 mEq/L — ABNORMAL HIGH (ref 19–32)
Calcium: 8 mg/dL — ABNORMAL LOW (ref 8.4–10.5)
Glucose, Bld: 77 mg/dL (ref 70–99)
Potassium: 3 mEq/L — ABNORMAL LOW (ref 3.5–5.3)

## 2012-07-05 NOTE — Progress Notes (Signed)
[facility] CL[facility] NOTE  RFV: hospital follow-up for MRSA HW infection of ankle Subjective:    Patient ID: [name], female    DOB: 10-Nov-1970, 42 y.o.   MRN: [id number]  HPI [name] is a 42 y.o. female with hx of DM, who sustained bimalleolar ankle fracture on 04/12/12 from rotational type of injury. She was admitted on 12/23 for ORIF, with hardware/screws implant. Given cefazolin and clindamycin preoperatively. She did well until 2/11 when she had some screws removed due to working out of skin.  She presented to [facility] on 2/14 since she noticed increasing redness and bleeding to her ankle. She was diagnosed with cellulitis, possibly surgical site infection started empirically on vanco and piptazo. On 2/15  S/p removal of deep implant, I x D. Cultures from OR on 2/15 grew MRSA (Oxa R, clinda S, Rif S, tetra S, bactrim S, vanco S mic 1) as well as wound cx from 2/14. She was discharged on 2/20 with vancomycin and rifampin since on Xray it showed the retention of 1 remaining screw. She was noted to have persistently elevated vanco levels in the 30, but it was unclear if patient was really taking medications accordingly. Her Cr was unchanged but she is small frame, with wt of 88lbs. Decision was made to switch her to daptomycin on 07/03/12 for the remaining part of her course of therapy.  Will see Dr. Shelle Iron today. Ran out of pain medication    Current Outpatient Prescriptions on File Prior to Visit  Medication Sig Dispense Refill  . albuterol (PROVENTIL) (2.5 MG/3ML) 0.083% nebulizer solution Take 2.5 mg by nebulization every 4 (four) hours as needed. For shortness of breath      . albuterol (VENTOLIN HFA) 108 (90 BASE) MCG/ACT inhaler Inhale 2 puffs into the lungs every 4 (four) hours as needed.        . budesonide-formoterol (SYMBICORT) 160-4.5 MCG/ACT inhaler Inhale 2 puffs into the lungs 2 (two) times daily.       . rifampin (RIFADIN) 300 MG capsule Take 1 capsule (300 mg total) by mouth  every 12 (twelve) hours.  60 capsule  1  . sodium chloride 0.9 % SOLN 100 mL with vancomycin 500 MG SOLR 500 mg Inject 500 mg into the vein every 12 (twelve) hours.  60 vial  0  . tiotropium (SPIRIVA) 18 MCG inhalation capsule Place 18 mcg into inhaler and inhale daily.      Marland Kitchen UNABLE TO FIND IV Vancomycin to be managed per [facility] Protocol.  Vancomycin IV x 41 days  1 Act  0  . HYDROcodone-acetaminophen (NORCO) 5-325 MG per tablet Take 1-2 tablets by mouth every 6 (six) hours as needed for pain.  30 tablet  0  . oxyCODONE (OXY IR/ROXICODONE) 5 MG immediate release tablet 1 PO every four hours prn  60 tablet  0   No current facility-administered medications on file prior to visit.   Active Ambulatory Problems    Diagnosis Date Noted  . 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 COPD 06/29/2006  . PEPTIC ULCER DIS., UNSPEC. W/O OBSTRUCTION 06/29/2006  . PAPANICOLAOU SMEAR, ABNORMAL 06/29/2006  . CLOSED FRACTURE OF BASE OF OTHER METACARPAL BONE 09/26/2007  . OXYGEN-USE OF SUPPLEMENTAL 08/19/2009  . PAPANICOLAOU SMEAR, ABNORMAL 06/29/2006  . Hyponatremia 06/15/2012  . Cellulitis and abscess 06/15/2012  . Opiate overdose 06/15/2012  . Wound dehiscence, surgical 06/17/2012  . MRSA (methicillin resistant staph aureus) culture positive 06/18/2012  . Hardware complicating wound infection 06/20/2012  .  Septic arthritis of ankle 06/21/2012   Resolved Ambulatory Problems    Diagnosis Date Noted  . TOBACCO DEPENDENCE 06/29/2006   Past Medical History  Diagnosis Date  . Arthritis   . COPD (chronic obstructive pulmonary disease)   . Respiratory failure, acute   . Diabetes mellitus   . Hx of toe surgery   . Ankle fracture   . On supplemental oxygen therapy        Review of Systems Diarrhea, but unchanged since starting antibiotics    Objective:   Physical Exam BP 108/78  Pulse 108  Temp(Src) 97.5 F (36.4 C) (Oral)  Ht 5' (1.524 m)  Wt 87 lb (39.463 kg)  BMI 16.99 kg/m2  LMP  06/02/2012 Physical Exam  Constitutional: He is oriented to person, place, and time. Slightly disheveled, unkept appearing No distress.  HENT: poor dentition Mouth/Throat: Oropharynx is clear and moist. No oropharyngeal exudate.  Cardiovascular: Normal rate, regular rhythm and normal heart sounds. Exam reveals no gallop and no friction rub.  No murmur heard.  Pulmonary/Chest: Effort normal and breath sounds normal. No respiratory distress. He has no wheezes.  Lymphadenopathy:  no cervical adenopathy. Skin: Skin is warm and dry. Left lateral lower extremity has large unhealing wound, 7.5cm long and 2 cm wide, shallow ulcer, yellowish exudate in distal wound bed Ext: clean, dry intact.     Assessment & Plan:   - will be on daptomycin and rifampin until March 30th 2014. - will check CMP today, due to being on rifampin - will see if can have the patient be seen earlier in wound care clinic.  - return to clinic prior to completion of daptomycin on March 30th

## 2012-07-11 ENCOUNTER — Encounter (HOSPITAL_BASED_OUTPATIENT_CLINIC_OR_DEPARTMENT_OTHER): Payer: Medicaid Other | Attending: General Surgery

## 2012-07-11 ENCOUNTER — Telehealth: Payer: Self-pay | Admitting: Pulmonary Disease

## 2012-07-11 DIAGNOSIS — E1169 Type 2 diabetes mellitus with other specified complication: Secondary | ICD-10-CM | POA: Insufficient documentation

## 2012-07-11 DIAGNOSIS — L97809 Non-pressure chronic ulcer of other part of unspecified lower leg with unspecified severity: Secondary | ICD-10-CM | POA: Insufficient documentation

## 2012-07-11 NOTE — Telephone Encounter (Signed)
Spoke to pt's spouse. Advised that [facility] didn't have any sooner appointments. Offered appointment with TP, he agreed.  Appointment has been scheduled for 07/16/12 @ 11:15am.

## 2012-07-12 LAB — GLUCOSE, CAPILLARY: Glucose-Capillary: 84 mg/dL (ref 70–99)

## 2012-07-13 NOTE — Progress Notes (Signed)
[facility]  NAME:  [name]            ACCOUNT NO.:  [id number]  MEDICAL RECORD NO.:  [id number]      DATE OF BIRTH:  Nov 03, 1970  PHYSICIAN:  Maxwell Caul, M.D. VISIT DATE:  07/12/2012                                  OFFICE VISIT   HISTORY OF PRESENT ILLNESS:  [name] is a now 42 year old woman with a history of type 2 diabetes.  She sustained a bimalleolar ankle fracture in December 2013.  She underwent an ORIF with hardware, screws, and an implant.  She did well until February when she had some screws removed due to the worsening of the skin.  She presented to [facility] when she noticed redness and bleeding at her ankle.  She was diagnosed with cellulitis, a surgical site infection.  She was on vanc and Zosyn at that time.  On June 16, 2012, she underwent removal of the deep implant.  I and D from the operative procedure at this point grew MRSA.  She was discharged on June 21, 2012, with vanc and rifampin.  She was changed to daptomycin on July 03, 2012, and I think she is also on rifampin.  She is nonweightbearing to the left leg at this point.  She has been left with a open wound on her left lateral ankle.  I am not exactly clear the dressing that is being done to this, however, I think postoperatively in February she had a wound VAC placed.  The patient states that this stopped 2 weeks ago, and she may have just a saline wet to dry at this point.  She says her son has been changing this, although [facility] is also there.  PAST MEDICAL HISTORY:  Type 2 diabetes, on oral agents.  She has Friedreich's ataxia, osteoarthritis, and COPD.  PAST SURGICAL HISTORY:  Left ankle fracture in December 2013, hardware removal of the left ankle in February 2014.  She has cholecystectomy, bilateral knee surgery, and left toe surgery in 2004.  MEDICATION LIST:  Reviewed.  She is on daptomycin and rifampin I believe.  She  is on metformin for diabetes, ASA 81 daily, oxycodone 5 mg q.4 p.r.n., budesonide and formoterol 160/4.5 two puffs 2 times a day, and albuterol 2.5 q.4 p.r.n.  PHYSICAL EXAMINATION:  VITAL SIGNS:  Temperature is 98.3, pulse 101, respirations 17, blood pressure 122/87. RESPIRATORY:  Decreased air entry bilaterally with hyper-resonance to percussion. CARDIAC:  Heart sounds are normal.  There is no murmurs. EXTREMITIES:  Peripheral pulses are intact and her ABI is calculated in this clinic was 1.07 on the left.  The area in question is over the left lateral lower extremity measured 7 x 1.5 x 0.1.  This is roughly the same as quoted in an note from June 21, 2012.  The area had a slight tan-colored eschar, which was gently debrided with a curette. Hemostasis with direct pressure.  She tolerated this well.  IMPRESSIONS:  Wound on the left lateral leg related to an ankle fracture and underlying methicillin-resistant Staphylococcus aureus infection requiring surgery for surgical repair in December and then for hardware removal in February.  The wound was debrided as noted.  We used Santyl to the wound bed, Hydrogel, Vaseline gauze with Kerlix and net.  This can be  changed 2 times by home care before we see this again next week.  PLAN: 1. My plan here would probably be to try and consider an Apligraf     placement in 2 weeks once the base of the wound has been debrided. 2. MRSA infection.  She continues on daptomycin and rifampin.  I     believe this is the current antibiotics.  This will continue apparently     ending at the end of this month.  I did no additional cultures here     as none seemed to warranted.          ______________________________ Maxwell Caul, M.D.     MGR/MEDQ  D:  07/12/2012  T:  07/13/2012  Job:  [id number]

## 2012-07-16 ENCOUNTER — Encounter: Payer: Self-pay | Admitting: Adult Health

## 2012-07-16 ENCOUNTER — Ambulatory Visit (INDEPENDENT_AMBULATORY_CARE_PROVIDER_SITE_OTHER): Payer: Medicaid Other | Admitting: Adult Health

## 2012-07-16 VITALS — BP 110/70 | HR 84 | Temp 98.1°F | Ht 60.0 in | Wt 78.0 lb

## 2012-07-16 DIAGNOSIS — J449 Chronic obstructive pulmonary disease, unspecified: Secondary | ICD-10-CM

## 2012-07-16 NOTE — Patient Instructions (Addendum)
Continue on spiriva daily  Continue on  symbicort twice daily  follow up Dr. Vassie Loll  In 3 months and As needed

## 2012-07-16 NOTE — Progress Notes (Signed)
Subjective:    Patient ID: [name], female    DOB: 12-26-1970, 42 y.o.   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 Alpha 1-antitrypsin was >301. Discharged on O2 . Has home health assistance.  PFTs 4/11 FEV1 0.84 (34%) She has smoked since age 33. Father had severe COPD.  CXR 06/2101 >NAD   01/06/12  Wants portable O2  pt states no change in breathing since last OV. Pt states that her 02 tanks are not working properly and are leaking. Pt states she has been feeling lethargic Taking her symbicort and spiriva without missed doses. No flare in cough/dyspnea but appears worse No leg swelling.  Unclear if ataxia is worse, in wheelchair She remains off cigs, boyfriend smokes >>No changes   04/09/12  Follow up  Returns for 3 month for COPD . Dyspnea is at baseline. No flare in cough or wheezing.  CAT score 32  Remains on spiriva and symbicort . Ran out of spiriva x 2 weeks. Needs new rx.  No ER or hospital admits.  No hemotpysis, no n/v/d or edema.  >>no changes   07/16/2012 Follow up for COPD  Returns for routine follow up for COPD  Dyspnea is at baseline without flare in cough or wheezing  No fever or discolored mucus.  O2 cerification for DME /MCR today  Sats were 88% on room air, 96% on 2 l/m  Will continue on O2 2 l/m continuous   Had an ankle fx 04/12/12 requiring surgery -ORIF w/ hardware/scres implant . Unfortunately got a MRSA infection . Had to have surgery 2/15 for removal of implant.  Followed at ID clinic , Has PICC line for prolonged abx.  Latest CXR 06/07/12 with no change in chronic emphysema .      Review of Systems Constitutional:   No  weight loss, night sweats,  Fevers, chills, fatigue, or  lassitude.  HEENT:   No headaches,  Difficulty swallowing,  Tooth/dental problems, or  Sore throat,                No  sneezing, itching, ear ache, nasal congestion, post nasal drip,   CV:  No chest pain,  Orthopnea, PND, swelling in lower extremities, anasarca, dizziness, palpitations, syncope.   GI  No heartburn, indigestion, abdominal pain, nausea, vomiting, diarrhea, change in bowel habits, loss of appetite, bloody stools.   Resp:   No non-productive cough,  No coughing up of blood.  No change in color of mucus.  No wheezing.  No chest wall deformity  Skin: no rash or lesions.  GU: no dysuria, change in color of urine, no urgency or frequency.  No flank pain, no hematuria        Psych:    No memory loss.         Objective:   Physical Exam   Gen. Thin/cachexic , small woman in wheelchair,  in no distress Disheleved, smells of smoke and etoh ENT - no lesions, no post nasal drip, poor dentition.  Neck: No JVD, no thyromegaly, no carotid bruits Lungs: no use of accessory muscles, no dullness to percussion,  decreased without rales or rhonchi  Cardiovascular: Rhythm regular, heart sounds  normal, no murmurs or gallops, no peripheral edema Musculoskeletal: No deformities, no cyanosis or clubbing  Left ankle dressing. PICC along right UE -site w/out redness.  Assessment & Plan:

## 2012-07-16 NOTE — Assessment & Plan Note (Addendum)
Compensated on present regimen .  No changes  Continue on O2 2l/m continuously

## 2012-07-19 ENCOUNTER — Encounter: Payer: Self-pay | Admitting: Internal Medicine

## 2012-07-23 ENCOUNTER — Telehealth: Payer: Self-pay | Admitting: Adult Health

## 2012-07-24 NOTE — Telephone Encounter (Signed)
Spoke with Leah @ [facility] out of [place], informed her I do not have a cmn on [employer], she will resend, also informed her I will be dealing only with local DME [employer], in the future if she needs anything she needs to correspond with Okey Regal or [facility] @ [facility], she agreed and from now on she will call local DME if she needs anything.

## 2012-07-24 NOTE — Telephone Encounter (Signed)
I spoke with [name] and she stated it was CMN and it was faxed over last week. If we did not receive it just call her back and she ill resend it. Please advise alida. thanks

## 2012-08-02 ENCOUNTER — Encounter: Payer: Self-pay | Admitting: Internal Medicine

## 2012-08-02 ENCOUNTER — Ambulatory Visit (INDEPENDENT_AMBULATORY_CARE_PROVIDER_SITE_OTHER): Payer: Medicaid Other | Admitting: Internal Medicine

## 2012-08-02 VITALS — BP 108/71 | HR 93 | Temp 97.9°F

## 2012-08-02 DIAGNOSIS — T889XXS Complication of surgical and medical care, unspecified, sequela: Secondary | ICD-10-CM

## 2012-08-02 DIAGNOSIS — T8450XS Infection and inflammatory reaction due to unspecified internal joint prosthesis, sequela: Secondary | ICD-10-CM

## 2012-08-02 LAB — COMPREHENSIVE METABOLIC PANEL
Albumin: 4.6 g/dL (ref 3.5–5.2)
Alkaline Phosphatase: 110 U/L (ref 39–117)
BUN: 4 mg/dL — ABNORMAL LOW (ref 6–23)
CO2: 26 mEq/L (ref 19–32)
Chloride: 96 mEq/L (ref 96–112)
Creat: 0.35 mg/dL — ABNORMAL LOW (ref 0.50–1.10)
Glucose, Bld: 74 mg/dL (ref 70–99)
Potassium: 4.4 mEq/L (ref 3.5–5.3)
Sodium: 140 mEq/L (ref 135–145)
Total Bilirubin: 0.9 mg/dL (ref 0.3–1.2)

## 2012-08-02 LAB — CBC WITH DIFFERENTIAL/PLATELET
Basophils Absolute: 0.1 10*3/uL (ref 0.0–0.1)
Lymphocytes Relative: 33 % (ref 12–46)
Lymphs Abs: 2.1 10*3/uL (ref 0.7–4.0)
MCV: 95.9 fL (ref 78.0–100.0)
Neutro Abs: 3.4 10*3/uL (ref 1.7–7.7)
Neutrophils Relative %: 56 % (ref 43–77)
Platelets: 203 10*3/uL (ref 150–400)
RBC: 5.18 MIL/uL — ABNORMAL HIGH (ref 3.87–5.11)
WBC: 6.2 10*3/uL (ref 4.0–10.5)

## 2012-08-02 LAB — C-REACTIVE PROTEIN: CRP: <0.5 mg/dL (ref <0.60)

## 2012-08-02 MED ORDER — SULFAMETHOXAZOLE-TMP DS 800-160 MG PO TABS: 1.0000 | ORAL_TABLET | Freq: Two times a day (BID) | ORAL | Status: DC

## 2012-08-02 MED ORDER — RIFAMPIN 300 MG PO CAPS: 300.0000 mg | ORAL_CAPSULE | Freq: Two times a day (BID) | ORAL | Status: DC

## 2012-08-02 NOTE — Progress Notes (Signed)
[facility] NOTE  RFV: hospital follow up for hw infection of left ankle Subjective:    Patient ID: [name], female    DOB: [birth year], 42 y.o.   MRN: [id number]  HPI [name] is a 42 y.o. female with hx of DM, who sustained bimalleolar ankle fracture on 04/12/12 from rotational type of injury. She was admitted on 12/23 for ORIF, with hardware/screws implant. Given cefazolin and clindamycin preoperatively. She did well until 2/11 when she had some screws removed due to working out of skin. She presented to [facility] on 2/14 since she noticed increasing redness and bleeding to her ankle. She was diagnosed with cellulitis, possibly surgical site infection started empirically on vanco and piptazo. On 2/15 S/p removal of deep implant, I x D. Cultures from OR on 2/15 grew MRSA (Oxa R, clinda S, Rif S, tetra S, bactrim S, vanco S mic 1) as well as wound cx from 2/14. She was discharged on 2/20 with vancomycin and rifampin since on Xray it showed the retention of 1 remaining screw. She was noted to have persistently elevated vanco levels in the 30, but it was unclear if patient was really taking medications accordingly. Her Cr was unchanged but she is small frame, with wt of 88lbs. She was kept on vancomycin with better instruction and observations from home health.Finished vanco on 3/31.  Doing well, walking with a walker. No fever, chills, pain from ankle    Current Outpatient Prescriptions on File Prior to Visit  Medication Sig Dispense Refill  . albuterol (PROVENTIL) (2.5 MG/3ML) 0.083% nebulizer solution Take 2.5 mg by nebulization every 4 (four) hours as needed. For shortness of breath      . albuterol (VENTOLIN HFA) 108 (90 BASE) MCG/ACT inhaler Inhale 2 puffs into the lungs every 4 (four) hours as needed.        . budesonide-formoterol (SYMBICORT) 160-4.5 MCG/ACT inhaler Inhale 2 puffs into the lungs 2 (two) times daily.       Marland Kitchen HYDROcodone-acetaminophen (NORCO) 5-325 MG per tablet  Take 1-2 tablets by mouth every 6 (six) hours as needed for pain.  30 tablet  0  . tiotropium (SPIRIVA) 18 MCG inhalation capsule Place 18 mcg into inhaler and inhale daily.      Marland Kitchen oxyCODONE (OXY IR/ROXICODONE) 5 MG immediate release tablet 1 PO every four hours prn  60 tablet  0  . rifampin (RIFADIN) 300 MG capsule Take 1 capsule (300 mg total) by mouth every 12 (twelve) hours.  60 capsule  1  . sodium chloride 0.9 % SOLN 100 mL with vancomycin 500 MG SOLR 500 mg Inject 500 mg into the vein every 12 (twelve) hours.  60 vial  0  . UNABLE TO FIND IV Vancomycin to be managed per [facility] Protocol.  Vancomycin IV x 41 days  1 Act  0   No current facility-administered medications on file prior to visit.   Active Ambulatory Problems    Diagnosis Date Noted  . 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 COPD 06/29/2006  . PEPTIC ULCER DIS., UNSPEC. W/O OBSTRUCTION 06/29/2006  . PAPANICOLAOU SMEAR, ABNORMAL 06/29/2006  . CLOSED FRACTURE OF BASE OF OTHER METACARPAL BONE 09/26/2007  . OXYGEN-USE OF SUPPLEMENTAL 08/19/2009  . PAPANICOLAOU SMEAR, ABNORMAL 06/29/2006  . Hyponatremia 06/15/2012  . Cellulitis and abscess 06/15/2012  . Opiate overdose 06/15/2012  . Wound dehiscence, surgical 06/17/2012  . MRSA (methicillin resistant staph aureus) culture positive 06/18/2012  . Hardware complicating wound infection 06/20/2012  . Septic  arthritis of ankle 06/21/2012   Resolved Ambulatory Problems    Diagnosis Date Noted  . TOBACCO DEPENDENCE 06/29/2006   Past Medical History  Diagnosis Date  . Arthritis   . COPD (chronic obstructive pulmonary disease)   . Respiratory failure, acute   . Diabetes mellitus   . Hx of toe surgery   . Ankle fracture   . On supplemental oxygen therapy       Review of Systems 10 point ROS reviewed and no positive pertinents    Objective:   Physical Exam BP 108/71  Pulse 93  Temp(Src) 97.9 F (36.6 C) (Oral)  LMP 07/22/2012 Physical Exam  Constitutional:  oriented to person, place, and time. Disheveled appearing. No distress.  HENT:  Mouth/Throat: Oropharynx is clear and moist. No oropharyngeal exudate.  Cardiovascular: Normal rate, regular rhythm and normal heart sounds. Exam reveals no gallop and no friction rub.  No murmur heard.  Pulmonary/Chest: Effort normal and breath sounds normal. No respiratory distress. He has no wheezes.  Lymphadenopathy:  no cervical adenopathy.   Skin: right upper extremity picc line. Small pus at entry site.  Ext: left ankle, surgical site well healed. Small linear scab on incision site         Assessment & Plan:  Ankle hw infection = will pull picc line today since she finished 6 wks of IV antibiotics, start bactrim 1 DS bid. In addition to rifampin 300mg  bid. Will need therapy for 6 months  rtc in 6 wk to see how she is doing with medicaiton

## 2012-08-09 ENCOUNTER — Ambulatory Visit (INDEPENDENT_AMBULATORY_CARE_PROVIDER_SITE_OTHER): Payer: Medicaid Other | Admitting: Pulmonary Disease

## 2012-08-09 ENCOUNTER — Encounter: Payer: Self-pay | Admitting: Pulmonary Disease

## 2012-08-09 VITALS — BP 122/76 | HR 93 | Temp 98.4°F | Ht 60.0 in | Wt 87.0 lb

## 2012-08-09 DIAGNOSIS — J449 Chronic obstructive pulmonary disease, unspecified: Secondary | ICD-10-CM

## 2012-08-09 DIAGNOSIS — J4489 Other specified chronic obstructive pulmonary disease: Secondary | ICD-10-CM

## 2012-08-09 MED ORDER — PREDNISONE 10 MG PO TABS: ORAL_TABLET | ORAL | Status: DC

## 2012-08-09 MED ORDER — AZITHROMYCIN 250 MG PO TABS: ORAL_TABLET | ORAL | Status: DC

## 2012-08-09 NOTE — Assessment & Plan Note (Signed)
Prednisone 10 mg tabs  Take 2 tabs daily with food x 5ds, then 1 tab daily with food x 5ds then STOP Z-pak Mucinex daily x 5 days We discussed hospice in the future  She was not veyr receptive to the idea but I hope that her PCP  Neurologist will continue this discussion

## 2012-08-09 NOTE — Patient Instructions (Signed)
Prednisone 10 mg tabs  Take 2 tabs daily with food x 5ds, then 1 tab daily with food x 5ds then STOP Z-pak Mucinex daily x 5 days We discussed that you will qualify for hospice in the future

## 2012-08-09 NOTE — Progress Notes (Signed)
  Subjective:    Patient ID: [name], female    DOB: 12-28-1970, 42 y.o.   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 Alpha 1-antitrypsin was >301. Discharged on O2 . Has home health assistance.  PFTs 4/11 FEV1 0.84 (34%) She has smoked since age 44. Father had severe COPD.  CAT score 32     08/09/2012 Had an ankle fx 04/12/12 requiring surgery -ORIF w/ hardware/scres implant . Unfortunately got a MRSA infection . Had to have surgery 2/15 for removal of implant.  Followed at ID clinic , Has PICC line for prolonged abx.  Latest CXR 06/07/12 with no change in chronic emphysema .  Certified for O2 on last visit Remains wheelchair bound for outdoors, uses walker at home, Ataxia is worse Pt states her cough is getting worse. Coughing up green phlem. It is so bad sometimes it makes her "gag". Her breathing has unchanged, wheezing is slight worse but no chest tx. She uses 2 liters O2 24/7.   Review of Systems neg for any significant sore throat, dysphagia, itching, sneezing, nasal congestion or excess/ purulent secretions, fever, chills, sweats, unintended wt loss, pleuritic or exertional cp, hempoptysis, orthopnea pnd or change in chronic leg swelling. Also denies presyncope, palpitations, heartburn, abdominal pain, nausea, vomiting, diarrhea or change in bowel or urinary habits, dysuria,hematuria, rash, arthralgias, visual complaints, headache, numbness weakness or ataxia.     Objective:   Physical Exam  Gen. Pleasant, poorly-nourished, in no distress, in wheelchair, appears to have lost more wt! ENT - no lesions, no post nasal drip Neck: No JVD, no thyromegaly, no carotid bruits Lungs: no use of accessory muscles, no dullness to percussion, decreased without rales or rhonchi  Cardiovascular: Rhythm  regular, heart sounds  normal, no murmurs or gallops, no peripheral edema Musculoskeletal: No deformities, no cyanosis or clubbing , lt ankle healing wound        Assessment & Plan:

## 2012-09-13 ENCOUNTER — Ambulatory Visit: Payer: Medicaid Other | Admitting: Internal Medicine

## 2012-09-27 ENCOUNTER — Ambulatory Visit (INDEPENDENT_AMBULATORY_CARE_PROVIDER_SITE_OTHER): Payer: Medicaid Other | Admitting: Internal Medicine

## 2012-09-27 ENCOUNTER — Encounter: Payer: Self-pay | Admitting: Internal Medicine

## 2012-09-27 VITALS — BP 117/76 | HR 89 | Temp 98.0°F | Wt 81.0 lb

## 2012-09-27 DIAGNOSIS — M009 Pyogenic arthritis, unspecified: Secondary | ICD-10-CM

## 2012-09-27 LAB — CBC WITH DIFFERENTIAL/PLATELET
Basophils Absolute: 0.1 10*3/uL (ref 0.0–0.1)
Basophils Relative: 1 % (ref 0–1)
Eosinophils Absolute: 0.1 10*3/uL (ref 0.0–0.7)
Eosinophils Relative: 3 % (ref 0–5)
HCT: 46 % (ref 36.0–46.0)
MCH: 35 pg — ABNORMAL HIGH (ref 26.0–34.0)
MCHC: 34.3 g/dL (ref 30.0–36.0)
MCV: 102 fL — ABNORMAL HIGH (ref 78.0–100.0)
Monocytes Absolute: 0.4 10*3/uL (ref 0.1–1.0)
RDW: 14.9 % (ref 11.5–15.5)

## 2012-09-27 LAB — BASIC METABOLIC PANEL
BUN: 7 mg/dL (ref 6–23)
Calcium: 8.8 mg/dL (ref 8.4–10.5)
Chloride: 103 mEq/L (ref 96–112)
Creat: 0.36 mg/dL — ABNORMAL LOW (ref 0.50–1.10)

## 2012-09-27 MED ORDER — SULFAMETHOXAZOLE-TRIMETHOPRIM 400-80 MG PO TABS: 1.0000 | ORAL_TABLET | Freq: Two times a day (BID) | ORAL | Status: DC

## 2012-09-27 NOTE — Progress Notes (Signed)
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   MRN: [id number]  HPI  [name] is a 42 y.o. female with hx of DM, who sustained bimalleolar ankle fracture on 04/12/12 from rotational type of injury. She was admitted on 12/23 for ORIF, with hardware/screws implant. Given cefazolin and clindamycin preoperatively. She did well until 2/11 when she had some screws removed due to working out of skin. She presented to [facility] on 2/14 since she noticed increasing redness and bleeding to her ankle. She was diagnosed with cellulitis, possibly surgical site infection started empirically on vanco and piptazo. On 2/15 S/p removal of deep implant, I x D. Cultures from OR on 2/15 grew MRSA (Oxa R, clinda S, Rif S, tetra S, bactrim S, vanco S mic 1) as well as wound cx from 2/14. She was discharged on 2/20 with vancomycin and rifampin for 6 wks and now on oral bactrim DS twice a day for the past 7 wks tolerating without difficulty. Last saw dr. Shelle Iron this past month who said all things doing well.   Current Outpatient Prescriptions on File Prior to Visit  Medication Sig Dispense Refill  . albuterol (PROVENTIL) (2.5 MG/3ML) 0.083% nebulizer solution Take 2.5 mg by nebulization every 4 (four) hours as needed. For shortness of breath      . albuterol (VENTOLIN HFA) 108 (90 BASE) MCG/ACT inhaler Inhale 2 puffs into the lungs every 4 (four) hours as needed.        Marland Kitchen azithromycin (ZITHROMAX) 250 MG tablet Take as directed  6 tablet  0  . budesonide-formoterol (SYMBICORT) 160-4.5 MCG/ACT inhaler Inhale 2 puffs into the lungs 2 (two) times daily.       . predniSONE (DELTASONE) 10 MG tablet Take 2 tabs daily w/ food x 5 days then 1 tab daily w/ food x 5 days then stop  15 tablet  0  . tiotropium (SPIRIVA) 18 MCG inhalation capsule Place 18 mcg into inhaler and inhale daily.       No  current facility-administered medications on file prior to visit.   Active Ambulatory Problems    Diagnosis Date Noted  . 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 COPD 06/29/2006  . PEPTIC ULCER DIS., UNSPEC. W/O OBSTRUCTION 06/29/2006  . CLOSED FRACTURE OF BASE OF OTHER METACARPAL BONE 09/26/2007  . OXYGEN-USE OF SUPPLEMENTAL 08/19/2009  . PAPANICOLAOU SMEAR, ABNORMAL 06/29/2006  . Hyponatremia 06/15/2012  . Cellulitis and abscess 06/15/2012  . Opiate overdose 06/15/2012  . Wound dehiscence, surgical 06/17/2012  . MRSA (methicillin resistant staph aureus) culture positive 06/18/2012  . Hardware complicating wound infection 06/20/2012  . Septic arthritis of ankle 06/21/2012   Resolved Ambulatory Problems    Diagnosis Date Noted  . TOBACCO DEPENDENCE 06/29/2006  . PAPANICOLAOU SMEAR, ABNORMAL 06/29/2006   Past Medical History  Diagnosis Date  . Arthritis   . COPD (chronic obstructive pulmonary disease)   . Respiratory failure, acute   . Diabetes mellitus   . Hx of toe surgery   . Ankle fracture   . On supplemental oxygen therapy    Social and family hx is unchanged   Review of Systems  Constitutional: Negative for fever, chills, diaphoresis, activity change, appetite change, fatigue and unexpected weight change.  Has lost 7 lb in the last 3 months HENT: Negative for congestion, sore throat, rhinorrhea, sneezing, trouble swallowing and sinus  pressure.  Eyes: Negative for photophobia and visual disturbance.  Respiratory: Negative for cough, chest tightness, shortness of breath, wheezing and stridor.  Cardiovascular: Negative for chest pain, palpitations and leg swelling.  Gastrointestinal: Negative for nausea, vomiting, abdominal pain,but has loose stools after meals Genitourinary: Negative for dysuria, hematuria, flank pain and difficulty urinating.  Musculoskeletal: Negative for myalgias, back pain, joint swelling, arthralgias and gait problem.  Skin: Negative for color change,  pallor, rash and wound.  Neurological: Negative for dizziness, tremors, weakness and light-headedness.  Hematological: Negative for adenopathy. Does not bruise/bleed easily.  Psychiatric/Behavioral: Negative for behavioral problems, confusion, sleep disturbance, dysphoric mood, decreased concentration and agitation.       Objective:   Physical Exam BP 117/76  Pulse 89  Temp(Src) 98 F (36.7 C) (Oral)  Wt 81 lb (36.741 kg)  BMI 15.82 kg/m2  LMP 09/03/2012 Physical Exam  Constitutional: oriented to person, place, and time. appears deshelved, . No distress.  HENT:  Poor dentition Mouth/Throat: Oropharynx is clear and moist. No oropharyngeal exudate.  Cardiovascular: Normal rate, regular rhythm and normal heart sounds. Exam reveals no gallop and no friction rub.  No murmur heard.  Pulmonary/Chest: Effort normal and breath sounds normal. No respiratory distress. He has no wheezes.  Abdominal: Soft. Bowel sounds are normal. He exhibits no distension. There is no tenderness.  Lymphadenopathy:  no cervical adenopathy.  Left ankle = well healed incision Neurological: alert and oriented to person, place, and time.  Skin: Skin is warm and dry. No rash noted. No erythema.  Psychiatric:  a normal mood and affect.  behavior is normal.   BMET    Component Value Date/Time   NA 139 09/27/2012 1119   K 4.1 09/27/2012 1119   CL 103 09/27/2012 1119   CO2 27 09/27/2012 1119   GLUCOSE 90 09/27/2012 1119   BUN 7 09/27/2012 1119   CREATININE 0.36* 09/27/2012 1119   CREATININE 0.67 06/21/2012 0545   CALCIUM 8.8 09/27/2012 1119   GFRNONAA >90 06/21/2012 0545   GFRAA >90 06/21/2012 0545    Lab Results  Component Value Date   ESRSEDRATE 1 09/27/2012   CBC    Component Value Date/Time   WBC 4.8 09/27/2012 1119   RBC 4.51 09/27/2012 1119   HGB 15.8* 09/27/2012 1119   HCT 46.0 09/27/2012 1119   PLT 128* 09/27/2012 1119   MCV 102.0* 09/27/2012 1119   MCH 35.0* 09/27/2012 1119   MCHC 34.3 09/27/2012 1119    RDW 14.9 09/27/2012 1119   LYMPHSABS 1.7 09/27/2012 1119   MONOABS 0.4 09/27/2012 1119   EOSABS 0.1 09/27/2012 1119   BASOSABS 0.1 09/27/2012 1119          Assessment & Plan:   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 We will dose adjust her bactrim dose due to decrease weight to bactrim SS BID  Weight loss/underweight with BMI of 15 = likely due to decrease appetite. Recommend for her to take ensure  SE of meds = will check bmp, cbc and sed rate (labs are reassuring)  rtc in 3 months at completion of her antibiotic course

## 2012-12-10 ENCOUNTER — Ambulatory Visit: Payer: Medicaid Other | Admitting: Adult Health

## 2012-12-12 ENCOUNTER — Ambulatory Visit: Payer: Medicaid Other | Admitting: Adult Health

## 2012-12-14 ENCOUNTER — Encounter: Payer: Self-pay | Admitting: Adult Health

## 2013-01-01 ENCOUNTER — Ambulatory Visit (INDEPENDENT_AMBULATORY_CARE_PROVIDER_SITE_OTHER): Payer: Medicaid Other | Admitting: Internal Medicine

## 2013-01-01 ENCOUNTER — Encounter: Payer: Self-pay | Admitting: Internal Medicine

## 2013-01-01 VITALS — BP 107/75 | HR 88 | Temp 98.1°F

## 2013-01-01 DIAGNOSIS — M009 Pyogenic arthritis, unspecified: Secondary | ICD-10-CM

## 2013-01-01 MED ORDER — SULFAMETHOXAZOLE-TRIMETHOPRIM 400-80 MG PO TABS: 1.0000 | ORAL_TABLET | Freq: Two times a day (BID) | ORAL | Status: DC

## 2013-01-01 NOTE — Progress Notes (Signed)
[facility] NOTE  RFV: MRSA ankle joint infection Subjective:    Patient ID: [name], female    DOB: [birth year], 42 y.o.   MRN: [id number]  HPI [name] is a 42 y.o. female with hx of DM, who sustained bimalleolar ankle fracture on 04/12/12 from rotational type of injury. She was admitted on 12/23 for ORIF, with hardware/screws implant. Given cefazolin and clindamycin preoperatively. She did well until 2/11 when she had some screws removed due to working out of skin. She presented to [facility] on 2/14 since she noticed increasing redness and bleeding to her ankle. She was diagnosed with cellulitis, possibly surgical site infection started empirically on vanco and piptazo. On 2/15 S/p removal of deep implant, I x D, but still has retained plates. Cultures from OR on 2/15 grew MRSA (Oxa R, clinda S, Rif S, tetra S, bactrim S, vanco S mic 1) as well as wound cx from 2/14. She was discharged on 2/20 with vancomycin and rifampin for 6 wks and then converted to taking bactrim, but ran out in [place] she didn't realize that she had refills to complete therapy.  Current Outpatient Prescriptions on File Prior to Visit  Medication Sig Dispense Refill  . albuterol (PROVENTIL) (2.5 MG/3ML) 0.083% nebulizer solution Take 2.5 mg by nebulization every 4 (four) hours as needed. For shortness of breath      . albuterol (VENTOLIN HFA) 108 (90 BASE) MCG/ACT inhaler Inhale 2 puffs into the lungs every 4 (four) hours as needed.        Marland Kitchen azithromycin (ZITHROMAX) 250 MG tablet Take as directed  6 tablet  0  . budesonide-formoterol (SYMBICORT) 160-4.5 MCG/ACT inhaler Inhale 2 puffs into the lungs 2 (two) times daily.       . predniSONE (DELTASONE) 10 MG tablet Take 2 tabs daily w/ food x 5 days then 1 tab daily w/ food x 5 days then stop  15 tablet  0  . sulfamethoxazole-trimethoprim (BACTRIM) 400-80 MG per tablet Take 1 tablet by mouth 2 (two) times daily.  60 tablet  2  . tiotropium (SPIRIVA) 18 MCG  inhalation capsule Place 18 mcg into inhaler and inhale daily.       No current facility-administered medications on file prior to visit.   Active Ambulatory Problems    Diagnosis Date Noted  . 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 COPD 06/29/2006  . PEPTIC ULCER DIS., UNSPEC. W/O OBSTRUCTION 06/29/2006  . CLOSED FRACTURE OF BASE OF OTHER METACARPAL BONE 09/26/2007  . OXYGEN-USE OF SUPPLEMENTAL 08/19/2009  . PAPANICOLAOU SMEAR, ABNORMAL 06/29/2006  . Hyponatremia 06/15/2012  . Cellulitis and abscess 06/15/2012  . Opiate overdose 06/15/2012  . Wound dehiscence, surgical 06/17/2012  . MRSA (methicillin resistant staph aureus) culture positive 06/18/2012  . Hardware complicating wound infection 06/20/2012  . Septic arthritis of ankle 06/21/2012   Resolved Ambulatory Problems    Diagnosis Date Noted  . TOBACCO DEPENDENCE 06/29/2006  . PAPANICOLAOU SMEAR, ABNORMAL 06/29/2006   Past Medical History  Diagnosis Date  . Arthritis   . COPD (chronic obstructive pulmonary disease)   . Respiratory failure, acute   . Diabetes mellitus   . Hx of toe surgery   . Ankle fracture   . On supplemental oxygen therapy        Review of Systems 12 point ROS    Objective:   Physical Exam BP 107/75  Pulse 88  Temp(Src) 98.1 F (36.7 C) (Oral) Physical Exam  Constitutional: oriented to person, place, and time. [employer]  frame. No distress.  HENT: poor dentition Mouth/Throat: Oropharynx is clear and moist. No oropharyngeal exudate.  Cardiovascular: Normal rate, regular rhythm and normal heart sounds. Exam reveals no gallop and no friction rub.  No murmur heard.  Pulmonary/Chest: Effort normal and breath sounds normal. No respiratory distress.  no wheezes.  Lymphadenopathy: no cervical adenopathy.  Neurological:  alert and oriented to person, place, and time.  Ext: left ankle non-tender. Surgical scar well healed Skin: Skin is warm and dry. No rash noted. No erythema.  Psychiatric:  a normal  mood and affect. His behavior is normal.       Assessment & Plan:  MRSA ankle infection with retained HW = she was to finish 6 month of therapy, however, she didn't realize that she was to take it for the additional 2 months to finish therapy. Bactrim SS BID #60 x 2 months  Health maintenance = flu vaccine today  2 months RTc

## 2013-01-02 ENCOUNTER — Ambulatory Visit (INDEPENDENT_AMBULATORY_CARE_PROVIDER_SITE_OTHER): Payer: Medicaid Other | Admitting: Adult Health

## 2013-01-02 ENCOUNTER — Encounter: Payer: Self-pay | Admitting: Adult Health

## 2013-01-02 VITALS — BP 112/70 | HR 90 | Temp 97.6°F

## 2013-01-02 DIAGNOSIS — J449 Chronic obstructive pulmonary disease, unspecified: Secondary | ICD-10-CM

## 2013-01-02 MED ORDER — ALBUTEROL SULFATE (2.5 MG/3ML) 0.083% IN NEBU: 2.5000 mg | INHALATION_SOLUTION | RESPIRATORY_TRACT | Status: DC | PRN

## 2013-01-02 MED ORDER — ALBUTEROL SULFATE HFA 108 (90 BASE) MCG/ACT IN AERS: 2.0000 | INHALATION_SPRAY | RESPIRATORY_TRACT | Status: DC | PRN

## 2013-01-02 NOTE — Patient Instructions (Addendum)
Continue on Symbicort 2 puffs twice daily. Continue on Spiriva 1 puff daily  continue on oxygen therapy at 2 L Follow up Dr. Vassie Loll  in 4 months and as needed

## 2013-01-02 NOTE — Addendum Note (Signed)
Addended by: Boone Master E on: 01/02/2013 10:32 AM   Modules accepted: Orders

## 2013-01-02 NOTE — Assessment & Plan Note (Signed)
Continue on Symbicort 2 puffs twice daily. Continue on Spiriva 1 puff daily  continue on oxygen therapy at 2 L Follow up Dr. Alva  in 4 months and as needed 

## 2013-01-02 NOTE — Progress Notes (Signed)
  Subjective:    Patient ID: [name], female    DOB: 10-24-1970, 42 y.o.   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 Alpha 1-antitrypsin was >301. Discharged on O2 . Has home health assistance.  PFTs 4/11 FEV1 0.84 (34%) She has smoked since age 89. Father had severe COPD.  CAT score 32    08/09/12  Had an ankle fx 04/12/12 requiring surgery -ORIF w/ hardware/scres implant . Unfortunately got a MRSA infection . Had to have surgery 2/15 for removal of implant.  Followed at ID clinic , Has PICC line for prolonged abx.  Latest CXR 06/07/12 with no change in chronic emphysema .  Certified for O2 on last visit Remains wheelchair bound for outdoors, uses walker at home, Ataxia is worse Pt states her cough is getting worse. Coughing up green phlem. It is so bad sometimes it makes her "gag". Her breathing has unchanged, wheezing is slight worse but no chest tx. She uses 2 liters O2 24/7.  >>zpack/pred taper   01/02/2013 Follow up COPD  Patient returns for a followup for COPD Patient reports that she is doing well. Does need refills of her nebulizer medicines. She denies any flare of cough and wheezing. Patient has had no hospitalizations or emergency room visits since last visit. Continues on oxygen therapy 24/7 At 2 L. Remains on symbicort and spiriva .  Continues on long term abx , now on bactrim for MRSA for septic arthritis in left ankle.  Had flu shot yesterday    Review of Systems  neg for any significant sore throat, dysphagia, itching, sneezing, nasal congestion or excess/ purulent secretions, fever, chills, sweats, unintended wt loss, pleuritic or exertional cp, hempoptysis, orthopnea pnd or change in chronic leg swelling. Also denies presyncope, palpitations, heartburn, abdominal pain, nausea,  vomiting, diarrhea or change in bowel or urinary habits, dysuria,hematuria, rash, arthralgias, visual complaints, headache, numbness weakness or ataxia.     Objective:   Physical Exam   Gen. Pleasant, poorly-nourished, in no distress, in wheelchair ENT - no lesions, no post nasal drip, poor dentition Neck: No JVD, no thyromegaly, no carotid bruits Lungs: no use of accessory muscles, no dullness to percussion, decreased BS in bases , no wheezing  Cardiovascular: Rhythm regular, heart sounds  normal, no murmurs or gallops, no peripheral edema Musculoskeletal: No deformities, no cyanosis or clubbing , lt ankle healing wound  CXR 06/2012 >chronic changes       Assessment & Plan:

## 2013-03-05 ENCOUNTER — Encounter: Payer: Self-pay | Admitting: Internal Medicine

## 2013-03-05 ENCOUNTER — Ambulatory Visit (INDEPENDENT_AMBULATORY_CARE_PROVIDER_SITE_OTHER): Payer: Medicaid Other | Admitting: Internal Medicine

## 2013-03-05 VITALS — BP 104/72 | HR 101 | Temp 98.8°F

## 2013-03-05 DIAGNOSIS — T889XXS Complication of surgical and medical care, unspecified, sequela: Secondary | ICD-10-CM

## 2013-03-05 DIAGNOSIS — T847XXS Infection and inflammatory reaction due to other internal orthopedic prosthetic devices, implants and grafts, sequela: Secondary | ICD-10-CM

## 2013-03-05 NOTE — Progress Notes (Signed)
[facility] NOTE  RFV: ankle HW infection Subjective:    Patient ID: [name], female    DOB: 1970/06/12, 42 y.o.   MRN: [id number]  HPI [name] is a 42 y.o. female with hx of DM, who sustained bimalleolar ankle fracture on 04/12/12 from rotational type of injury. She was admitted on 12/23 for ORIF, with hardware/screws implant. Given cefazolin and clindamycin preoperatively. She did well until 2/11 when she had some screws removed due to working out of skin. She presented to [facility] on 2/14 since she noticed increasing redness and bleeding to her ankle. She was diagnosed with cellulitis, possibly surgical site infection started empirically on vanco and piptazo. On 2/15 S/p removal of deep implant, I x D, but still has retained plates. Cultures from OR on 2/15 grew MRSA (Oxa R, clinda S, Rif S, tetra S, bactrim S, vanco S mic 1) as well as wound cx from 2/14. She was discharged on 2/20 with vancomycin and rifampin for 6 wks and then converted to taking bactrim, but ran out in July she didn't realize that she had refills to complete therapy. She has now nearly finished 6 months of therapy with bactrim. Has 2 days left.  She did  sustain a ground level fall in bathroom and fractured her left foot across metatarsals, bruising, wearing a soft shoe for immobilization  Current Outpatient Prescriptions on File Prior to Visit  Medication Sig Dispense Refill  . albuterol (PROVENTIL) (2.5 MG/3ML) 0.083% nebulizer solution Take 3 mLs (2.5 mg total) by nebulization every 4 (four) hours as needed for wheezing or shortness of breath. Dx: 496  300 mL  5  . albuterol (VENTOLIN HFA) 108 (90 BASE) MCG/ACT inhaler Inhale 2 puffs into the lungs every 4 (four) hours as needed for wheezing or shortness of breath.  18 g  5  . budesonide-formoterol (SYMBICORT) 160-4.5 MCG/ACT inhaler Inhale 2 puffs into the lungs 2 (two) times daily.       Marland Kitchen sulfamethoxazole-trimethoprim (BACTRIM) 400-80 MG per tablet Take 1  tablet by mouth 2 (two) times daily.  60 tablet  1  . tiotropium (SPIRIVA) 18 MCG inhalation capsule Place 18 mcg into inhaler and inhale daily.       No current facility-administered medications on file prior to visit.   Active Ambulatory Problems    Diagnosis Date Noted  . 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 COPD 06/29/2006  . PEPTIC ULCER DIS., UNSPEC. W/O OBSTRUCTION 06/29/2006  . CLOSED FRACTURE OF BASE OF OTHER METACARPAL BONE 09/26/2007  . OXYGEN-USE OF SUPPLEMENTAL 08/19/2009  . PAPANICOLAOU SMEAR, ABNORMAL 06/29/2006  . Hyponatremia 06/15/2012  . Cellulitis and abscess 06/15/2012  . Opiate overdose 06/15/2012  . Wound dehiscence, surgical 06/17/2012  . MRSA (methicillin resistant staph aureus) culture positive 06/18/2012  . Hardware complicating wound infection 06/20/2012  . Septic arthritis of ankle 06/21/2012   Resolved Ambulatory Problems    Diagnosis Date Noted  . TOBACCO DEPENDENCE 06/29/2006  . PAPANICOLAOU SMEAR, ABNORMAL 06/29/2006   Past Medical History  Diagnosis Date  . Arthritis   . COPD (chronic obstructive pulmonary disease)   . Respiratory failure, acute   . Diabetes mellitus   . Hx of toe surgery   . Ankle fracture   . On supplemental oxygen therapy       Review of Systems 12 point ROS    Objective:   Physical Exam BP 104/72  Pulse 101  Temp(Src) 98.8 F (37.1 C) (Oral)  Constitutional:  oriented to person, place,  and time. Petite, dishelved No distress.  HENT:  Mouth/Throat: poor dentition Cardiovascular: Normal rate, regular rhythm and normal heart sounds. Exam reveals no gallop and no friction rub.  No murmur heard.  Pulmonary/Chest: Effort normal and breath sounds normal. No respiratory distress. He has no wheezes.  Ext = left foot bandaged in ace bandage, swelling to dorsum of foot Skin: bruising to dorsum of left foot      Assessment & Plan:  Left ankle HW infection with MRSA: Finish bactrim in 2 days to complete 6 month  course  rtc PRN  rtc as PRN

## 2013-06-19 ENCOUNTER — Ambulatory Visit: Payer: Medicaid Other | Admitting: Pulmonary Disease

## 2013-07-09 ENCOUNTER — Ambulatory Visit (INDEPENDENT_AMBULATORY_CARE_PROVIDER_SITE_OTHER)
Admission: RE | Admit: 2013-07-09 | Discharge: 2013-07-09 | Disposition: A | Discharge status: Home / Self Care | Payer: Medicaid Other | Source: Ambulatory Visit | Attending: Pulmonary Disease | Admitting: Pulmonary Disease

## 2013-07-09 ENCOUNTER — Ambulatory Visit (INDEPENDENT_AMBULATORY_CARE_PROVIDER_SITE_OTHER): Payer: Medicaid Other | Admitting: Pulmonary Disease

## 2013-07-09 ENCOUNTER — Encounter: Payer: Self-pay | Admitting: Pulmonary Disease

## 2013-07-09 VITALS — BP 120/78 | HR 99 | Ht 60.0 in | Wt 82.0 lb

## 2013-07-09 DIAGNOSIS — J449 Chronic obstructive pulmonary disease, unspecified: Secondary | ICD-10-CM

## 2013-07-09 MED ORDER — PREDNISONE 10 MG PO TABS: ORAL_TABLET | ORAL | Status: DC

## 2013-07-09 MED ORDER — DOXYCYCLINE HYCLATE 100 MG PO TABS: 100.0000 mg | ORAL_TABLET | Freq: Every day | ORAL | Status: DC

## 2013-07-09 MED ORDER — IPRATROPIUM BROMIDE 0.02 % IN SOLN: 0.5000 mg | Freq: Three times a day (TID) | RESPIRATORY_TRACT | Status: DC

## 2013-07-09 NOTE — Progress Notes (Signed)
   Subjective:    Patient ID: [name], female    DOB: 08/25/1970, 43 y.o.   MRN: [id number]  HPI  PCP - [name]  Neuro- [name]   43/F, ex -smoker with friedrich's ataxia for FU of COPD  Admitted 07/19/2009- 07/28/2009 w/ COPD exacerbation noted to have hypercarbic RF on admission required intubation. Alpha 1-antitrypsin was >301. Discharged on O2  PFTs 4/11 FEV1 0.84 (34%) She has smoked since age 43. Father had severe COPD.   Had an ankle fx 04/12/12 requiring surgery -ORIF w/ hardware/scres implant . Unfortunately got a MRSA infection . Had to have surgery 06/2012 for removal of implant.  Required prolonged abx.       07/09/2013  Remains wheelchair bound for outdoors, uses walker at home, Ataxia is worse  Accompanied by husband and son  Chief Complaint  Patient presents with  . Follow-up    Having increased sob at night when laying down to sleep and fatigued     Patient has had no hospitalizations or emergency room visits since last visit.  Continues on oxygen therapy 24/7 At 2 L.  Remains on symbicort  Compliant with albuterol nebs and oxygen    Review of Systems neg for any significant sore throat, dysphagia, itching, sneezing, nasal congestion or excess/ purulent secretions, fever, chills, sweats, unintended wt loss, pleuritic or exertional cp, hempoptysis, orthopnea pnd or change in chronic leg swelling. Also denies presyncope, palpitations, heartburn, abdominal pain, nausea, vomiting, diarrhea or change in bowel or urinary habits, dysuria,hematuria, rash, arthralgias, visual complaints, headache, numbness weakness or ataxia.     Objective:   Physical Exam  Gen. Pleasant,poorly nourished, in no distress, in wheelchair ENT - no lesions, no post nasal drip Neck: No JVD, no thyromegaly, no carotid bruits Lungs: no use of accessory muscles, no dullness to percussion, clear without rales , faint rhonchi  Cardiovascular: Rhythm regular, heart sounds  normal, no  murmurs or gallops, no peripheral edema Musculoskeletal: No deformities, no cyanosis or clubbing        Assessment & Plan:

## 2013-07-09 NOTE — Assessment & Plan Note (Signed)
Treat as flare CXR today STOP spiriva Use atrovent nebs (can mix with albuterol) thrice daily Doxycycline 100 mg daily X 7days Prednisone 10 mg tabs  Take 2 tabs daily with food x 5ds, then 1 tab daily with food x 5ds then STOP Will need goals of care conversation in the future - has gradually deteriorated

## 2013-07-09 NOTE — Patient Instructions (Signed)
CXR today STOP spiriva Use atrovent nebs (can mix with albuterol) thrice daily Doxycycline 100 mg daily X 7days Prednisone 10 mg tabs  Take 2 tabs daily with food x 5ds, then 1 tab daily with food x 5ds then STOP

## 2013-11-06 ENCOUNTER — Encounter (INDEPENDENT_AMBULATORY_CARE_PROVIDER_SITE_OTHER): Payer: Self-pay

## 2013-11-06 ENCOUNTER — Encounter: Payer: Self-pay | Admitting: Neurology

## 2013-11-06 ENCOUNTER — Ambulatory Visit (INDEPENDENT_AMBULATORY_CARE_PROVIDER_SITE_OTHER): Payer: Medicaid Other | Admitting: Neurology

## 2013-11-06 VITALS — BP 94/60 | HR 82 | Ht 60.0 in | Wt 86.0 lb

## 2013-11-06 DIAGNOSIS — R269 Unspecified abnormalities of gait and mobility: Secondary | ICD-10-CM

## 2013-11-06 DIAGNOSIS — M21379 Foot drop, unspecified foot: Secondary | ICD-10-CM | POA: Insufficient documentation

## 2013-11-06 DIAGNOSIS — G111 Early-onset cerebellar ataxia: Principal | ICD-10-CM

## 2013-11-06 DIAGNOSIS — G1111 Friedreich ataxia: Secondary | ICD-10-CM

## 2013-11-06 NOTE — Patient Instructions (Signed)

## 2013-11-06 NOTE — Progress Notes (Signed)
Reason for visit: Friedreich's ataxia  [name] is a 43 y.o. 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 She currently is living with a friend, and she has had gradual progressive difficulty with her balance and walking since she was in her early 48s. She is no longer able to ambulate effectively outside the house. Inside the house, she can walk by holding onto furniture. She was first seen through this office in 2007, and then again in 2011, and she has not been seen since that time. 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 It is not clear that she has ever had nerve conduction studies done. She denies problems controlling the bowels or the bladder. She has significant issues with asthma and COPD, and she is on oxygen at nighttime. She reports some numbness in the left greater right foot. She denies any pain. She has bilateral foot drops. She indicates that her maternal grandmother also had a similar disorder. She comes to this office for an evaluation.  Past Medical History  Diagnosis Date  . Arthritis   . COPD (chronic obstructive pulmonary disease)   . Respiratory failure, acute   . Diabetes mellitus     borderline  . Hx of toe surgery     X 2  . Ankle fracture     LEFT  . On supplemental oxygen therapy     2 L continuous  . Friedreich's ataxia   . GERD (gastroesophageal reflux disease)   . Asthma     Past Surgical History  Procedure Laterality Date  . Cholecystectomy    . Knee arthroscopy      bilateral  . Toe surgery      x2  . Orif ankle fracture  04/23/2012    Procedure: OPEN REDUCTION INTERNAL FIXATION (ORIF) ANKLE FRACTURE;  Surgeon: Javier Docker, MD;  Location: [facility];  Service: Orthopedics;  Laterality: Left;  . Hardware removal Left 06/16/2012   Procedure: HARDWARE REMOVAL;  Surgeon: Toni Arthurs, MD;  Location: [facility];  Service: Orthopedics;  Laterality: Left;  left ankle  . Incision and drainage Left 06/16/2012    Procedure: INCISION AND DRAINAGE;  Surgeon: Toni Arthurs, MD;  Location: [facility];  Service: Orthopedics;  Laterality: Left;  . Application of wound vac Left 06/16/2012    Procedure: APPLICATION OF WOUND VAC;  Surgeon: Toni Arthurs, MD;  Location: [facility];  Service: Orthopedics;  Laterality: Left;    Family History  Problem Relation Age of Onset  . Diabetes    . Asthma    . Seizures Mother   . Cancer - Colon Mother   . Emphysema Father   . Diabetes Father     Social history:  reports that she quit smoking about 3 years ago. Her smoking use included Cigarettes. She has a 5 pack-year smoking history. She has never used smokeless tobacco. She reports that she does not drink alcohol or use illicit drugs.  Medications:  Current Outpatient Prescriptions on File Prior to Visit  Medication Sig Dispense Refill  . albuterol (PROVENTIL) (2.5 MG/3ML) 0.083% nebulizer solution Take 3 mLs (2.5 mg total) by nebulization every 4 (four) hours as needed for wheezing or shortness of breath. Dx: 496  300 mL  5  . albuterol (VENTOLIN HFA) 108 (90 BASE) MCG/ACT inhaler Inhale 2  puffs into the lungs every 4 (four) hours as needed for wheezing or shortness of breath.  18 g  5  . budesonide-formoterol (SYMBICORT) 160-4.5 MCG/ACT inhaler Inhale 2 puffs into the lungs 2 (two) times daily.       Marland Kitchen. ipratropium (ATROVENT) 0.02 % nebulizer solution Take 2.5 mLs (0.5 mg total) by nebulization 3 (three) times daily. Dx 496  360 mL  3   No current facility-administered medications on file prior to visit.     No Known Allergies  ROS:  Out of a complete 14 system review of symptoms, the patient complains only of the following symptoms, and all other reviewed systems are negative.  Shortness of breath Gait disorder  Blood pressure 94/60, pulse 82,  height 5' (1.524 m), weight 86 lb (39.009 kg).  Physical Exam  General: The patient is alert and cooperative at the time of the examination.  Eyes: Pupils are equal, round, and reactive to light. Discs are flat bilaterally.  Neck: The neck is supple, no carotid bruits are noted.  Respiratory: The respiratory examination is notable for bilateral wheezes, the patient will cough frequently.  Cardiovascular: The cardiovascular examination reveals a regular rate and rhythm, no obvious murmurs or rubs are noted.  Skin: Extremities are without significant edema.  Neurologic Exam  Mental status: The patient is alert and oriented x 3 at the time of the examination. The patient has apparent normal recent and remote memory, with an apparently normal attention span and concentration ability.  Cranial nerves: Facial symmetry is present. There is good sensation of the face to pinprick and soft touch bilaterally. The strength of the facial muscles and the muscles to head turning and shoulder shrug are normal bilaterally. Speech is ataxic. Extraocular movements are full. Visual fields are full. The tongue is midline, and the patient has symmetric elevation of the soft palate. No obvious hearing deficits are noted.  Motor: The motor testing reveals 5 over 5 strength of all 4 extremities, with the exception that there are bilateral foot drops. Good symmetric motor tone is noted throughout.  Sensory: Sensory testing is notable for a stocking pattern pinprick sensory deficit up to the knees bilaterally. The patient has prominent reduction in position sense all 4 extremities, some relative decrease in vibration sensation on the right foot as compared to left. Vibration sensation is symmetric in arms.  Coordination: Cerebellar testing reveals prominent dysmetria with finger-nose-finger and heel-to-shin bilaterally.  Gait and station: Gait is wide-based, ataxia. The patient requires assistance for ambulation.  The patient has bilateral steppage gait pattern as well.  Reflexes: Deep tendon reflexes are symmetric, but are absent bilaterally. Toes are downgoing bilaterally.   Assessment/Plan:  1. Friedreich's ataxia  2. COPD  3. Gait disorder  The patient is living with a friend, and her living situation may not be adequate. The patient has a standard wheelchair, and she requires someone else to push the chair for mobilization. The patient could potentially benefit from a motorized wheelchair, but I am not sure her home environment would accommodate this. The patient will have physical and occupational therapy go out to the house to help with safety issues with ambulation, and activities of daily living. Assessing the living environment is important, and the patient may require further equipment in the home to improve independence and safety. A social worker will be ordered as well. The patient does not have a wheelchair ramp into the house. She will followup in 4 or 5 months.  Marlan Palau. Keith Willis  MD 11/06/2013 9:06 PM  [facility] [address] [place], [place] [id number]  Phone [phone] Fax [phone]

## 2013-12-03 ENCOUNTER — Telehealth: Payer: Self-pay | Admitting: Pulmonary Disease

## 2013-12-03 NOTE — Telephone Encounter (Signed)
Form is in RA's look at. Please advise thanks

## 2013-12-06 NOTE — Telephone Encounter (Signed)
done

## 2013-12-06 NOTE — Telephone Encounter (Signed)
Pt aware form is ready for pick up. Nothing further needed

## 2014-01-15 ENCOUNTER — Ambulatory Visit: Payer: Medicaid Other | Admitting: Pulmonary Disease

## 2014-03-10 ENCOUNTER — Encounter: Payer: Self-pay | Admitting: Pulmonary Disease

## 2014-03-10 ENCOUNTER — Ambulatory Visit (INDEPENDENT_AMBULATORY_CARE_PROVIDER_SITE_OTHER): Payer: Medicaid Other | Admitting: Pulmonary Disease

## 2014-03-10 VITALS — BP 115/62 | HR 76 | Temp 98.9°F

## 2014-03-10 DIAGNOSIS — J449 Chronic obstructive pulmonary disease, unspecified: Secondary | ICD-10-CM

## 2014-03-10 DIAGNOSIS — R1310 Dysphagia, unspecified: Secondary | ICD-10-CM | POA: Insufficient documentation

## 2014-03-10 NOTE — Assessment & Plan Note (Signed)
Swallow study

## 2014-03-10 NOTE — Progress Notes (Signed)
   Subjective:    Patient ID: [name], female    DOB: 1971-03-20, 43 y.o.   MRN: [id number]  HPI  PCP - [name]  Neuro- [name]   43/F, ex -smoker with friedrich's ataxia for FU of COPD  Admitted 07/19/2009- 07/28/2009 w/ COPD exacerbation noted to have hypercarbic RF on admission required intubation. Alpha 1-antitrypsin was >301. Discharged on O2  PFTs 4/11 FEV1 0.84 (34%) She has smoked since age 43. Father had severe COPD.   Had an ankle fx 04/12/12 requiring surgery -ORIF w/ hardware/scres implant . Unfortunately got a MRSA infection . Had to have surgery 06/2012 for removal of implant.  Required prolonged abx.    03/10/2014  Chief Complaint  Patient presents with  . Follow-up    F/U COPD; coughing to the point of vomiting; chest congestion; no fever; needs new Neb machine, lost old one when she moved   1936m FU for COPD  Remains wheelchair bound for outdoors, uses walker at home, Ataxia is worse  Accompanied by husband  Patient has had no hospitalizations or emergency room visits since last visit.  Continues on oxygen therapy 24/7 At 2 L.  Remains on symbicort  Compliant with albuterol nebs -needs new Rx for neb machine C/o dysphagia, regardless of consistency   Review of Systems neg for any significant sore throat, dysphagia, itching, sneezing, nasal congestion or excess/ purulent secretions, fever, chills, sweats, unintended wt loss, pleuritic or exertional cp, hempoptysis, orthopnea pnd or change in chronic leg swelling. Also denies presyncope, palpitations, heartburn, abdominal pain, nausea, vomiting, diarrhea or change in bowel or urinary habits, dysuria,hematuria, rash, arthralgias, visual complaints, headache, numbness weakness or ataxia.     Objective:   Physical Exam  Gen. Pleasant, poorly-nourished, in no distress, normal affect, in wheelchair ENT - no lesions, no post nasal drip Neck: No JVD, no thyromegaly, no carotid bruits Lungs: no use of  accessory muscles, no dullness to percussion, clear without rales or rhonchi  Cardiovascular: Rhythm regular, heart sounds  normal, no murmurs or gallops, no peripheral edema Abdomen: soft and non-tender, no hepatosplenomegaly, BS normal. Musculoskeletal: No deformities, no cyanosis or clubbing Neuro:  alert, non focal, paraparesis        Assessment & Plan:

## 2014-03-10 NOTE — Patient Instructions (Signed)
Rx for nebuliser & albuterol nebs You should have a document for healthcare power of attorney OK to take mucinex daily for cough (can take liquid) swallowing study

## 2014-03-10 NOTE — Assessment & Plan Note (Addendum)
Rx for nebuliser & albuterol nebs You should have a document for healthcare power of attorney OK to take mucinex daily for cough (can take liquid) I initiated palliative conversation with her She does desire mechanical ventilation but would not want permanent tubes.

## 2014-03-12 ENCOUNTER — Other Ambulatory Visit: Payer: Self-pay | Admitting: Pulmonary Disease

## 2014-03-12 DIAGNOSIS — R1310 Dysphagia, unspecified: Secondary | ICD-10-CM

## 2014-03-13 ENCOUNTER — Other Ambulatory Visit (HOSPITAL_COMMUNITY): Payer: Self-pay | Admitting: Pulmonary Disease

## 2014-03-13 DIAGNOSIS — R1310 Dysphagia, unspecified: Secondary | ICD-10-CM

## 2014-03-13 DIAGNOSIS — R1314 Dysphagia, pharyngoesophageal phase: Secondary | ICD-10-CM

## 2014-03-21 ENCOUNTER — Ambulatory Visit (HOSPITAL_COMMUNITY)
Admission: RE | Admit: 2014-03-21 | Discharge: 2014-03-21 | Disposition: A | Discharge status: Home / Self Care | Payer: Medicaid Other | Source: Ambulatory Visit | Attending: Pulmonary Disease | Admitting: Pulmonary Disease

## 2014-03-21 DIAGNOSIS — K219 Gastro-esophageal reflux disease without esophagitis: Secondary | ICD-10-CM | POA: Diagnosis not present

## 2014-03-21 DIAGNOSIS — J45909 Unspecified asthma, uncomplicated: Secondary | ICD-10-CM | POA: Insufficient documentation

## 2014-03-21 DIAGNOSIS — J449 Chronic obstructive pulmonary disease, unspecified: Secondary | ICD-10-CM | POA: Diagnosis not present

## 2014-03-21 DIAGNOSIS — Z9981 Dependence on supplemental oxygen: Secondary | ICD-10-CM | POA: Diagnosis not present

## 2014-03-21 DIAGNOSIS — J9600 Acute respiratory failure, unspecified whether with hypoxia or hypercapnia: Secondary | ICD-10-CM | POA: Diagnosis not present

## 2014-03-21 DIAGNOSIS — R1310 Dysphagia, unspecified: Secondary | ICD-10-CM | POA: Insufficient documentation

## 2014-03-21 DIAGNOSIS — R1314 Dysphagia, pharyngoesophageal phase: Secondary | ICD-10-CM

## 2014-03-21 DIAGNOSIS — E119 Type 2 diabetes mellitus without complications: Secondary | ICD-10-CM | POA: Insufficient documentation

## 2014-03-21 DIAGNOSIS — G111 Early-onset cerebellar ataxia: Secondary | ICD-10-CM | POA: Insufficient documentation

## 2014-03-21 NOTE — Procedures (Signed)
Objective Swallowing Evaluation: Modified Barium Swallowing Study  Patient Details  Name: [name] MRN: [id number] Date of Birth: [birth year]  Today's Date: 03/21/2014 Time: 1240-1310 SLP Time Calculation (min) (ACUTE ONLY): 30 min  Past Medical History:  Past Medical History  Diagnosis Date  . Arthritis   . COPD (chronic obstructive pulmonary disease)   . Respiratory failure, acute   . Diabetes mellitus     borderline  . Hx of toe surgery     X 2  . Ankle fracture     LEFT  . On supplemental oxygen therapy     2 L continuous  . Friedreich's ataxia   . GERD (gastroesophageal reflux disease)   . Asthma    Past Surgical History:  Past Surgical History  Procedure Laterality Date  . Cholecystectomy    . Knee arthroscopy      bilateral  . Toe surgery      x2  . Orif ankle fracture  04/23/2012    Procedure: OPEN REDUCTION INTERNAL FIXATION (ORIF) ANKLE FRACTURE;  Surgeon: Javier DockerJeffrey C Beane, MD;  Location: [facility];  Service: Orthopedics;  Laterality: Left;  . Hardware removal Left 06/16/2012    Procedure: HARDWARE REMOVAL;  Surgeon: Toni ArthursJohn Hewitt, MD;  Location: [facility];  Service: Orthopedics;  Laterality: Left;  left ankle  . Incision and drainage Left 06/16/2012    Procedure: INCISION AND DRAINAGE;  Surgeon: Toni ArthursJohn Hewitt, MD;  Location: [facility];  Service: Orthopedics;  Laterality: Left;  . Application of wound vac Left 06/16/2012    Procedure: APPLICATION OF WOUND VAC;  Surgeon: Toni ArthursJohn Hewitt, MD;  Location: [facility];  Service: Orthopedics;  Laterality: Left;   HPI:  43 y.o. female with Friedreich's ataxia, COPD referred for OPMBS. Pt's speech is clear; ataxic movements of bilateral UE noted.   Pt describes solid food dysphagia with globus and occasional cough with meals.  Pt has maintained weight of 80 lbs; her friend who accompanied her stated she rarely eats.        Assessment / Plan / Recommendation Clinical Impression  Dysphagia Diagnosis: Within Functional Limits;Suspected  primary esophageal dysphagia Clinical impression: Pt presents with normal oropharyngeal function with timely mastication, swift swallow response, and reliable airway protection across all consistencies.  Esophageal screen revealed delayed passage of solids through esophagus, but with eventual clearance through LES.  Discussed with pt basic strategies to facilitate movement of solids through esophagus.  No further f/u is warranted.     Treatment Recommendation  No treatment recommended at this time    Diet Recommendation Regular;Thin liquid   Medication Administration: Whole meds with liquid Supervision: Patient able to self feed Compensations: Follow solids with liquid Postural Changes and/or Swallow Maneuvers: Seated upright 90 degrees;Upright 30-60 min after meal    Other  Recommendations Oral Care Recommendations: Oral care BID   Follow Up Recommendations  None       Pertinent Vitals/Pain No pain    SLP Swallow Goals     General HPI: 43 y.o. female with Friedrich's ataxia, COPD referred for OPMBS. Pt's speech is clear; ataxic movements of bilateral UE noted.   Pt describes solid food dysphagia with globus and occasional cough with meals.  Pt has maintained weight of 80 lbs; her friend who accompanied her stated she rarely eats.    Type of Study: Modified Barium Swallowing Study Previous Swallow Assessment: none per records Diet Prior to this Study: Regular;Thin liquids Temperature Spikes Noted: No Respiratory Status: Room air History of Recent Intubation: No  Behavior/Cognition: Alert;Cooperative Oral Cavity - Dentition: Missing dentition Oral Motor / Sensory Function: Within functional limits Self-Feeding Abilities: Able to feed self Patient Positioning: Upright in chair Baseline Vocal Quality: Clear Volitional Cough: Strong Volitional Swallow: Able to elicit Anatomy: Within functional limits Pharyngeal Secretions: Not observed secondary MBS    Reason for Referral      Oral Phase Oral Preparation/Oral Phase Oral Phase: WFL   Pharyngeal Phase Pharyngeal Phase Pharyngeal Phase: Within functional limits  Cervical Esophageal Phase    GO    Cervical Esophageal Phase Cervical Esophageal Phase: Impaired Cervical Esophageal Phase - Solids Puree:  (delayed emptying) Mechanical Soft:  (delayed emptying)    Functional Assessment Tool Used: clinical judgement Functional Limitations: Swallowing Swallow Current Status ([id number]): At least 1 percent but less than 20 percent impaired, limited or restricted Swallow Goal Status [phone](G8997): At least 1 percent but less than 20 percent impaired, limited or restricted Swallow Discharge Status [phone](G8998): At least 1 percent but less than 20 percent impaired, limited or restricted    Blenda MountsCouture, Caydee Talkington Laurice 03/21/2014, 1:22 PM

## 2014-04-03 ENCOUNTER — Ambulatory Visit: Payer: Medicaid Other

## 2014-04-11 ENCOUNTER — Ambulatory Visit (HOSPITAL_COMMUNITY)
Admission: RE | Admit: 2014-04-11 | Discharge: 2014-04-11 | Disposition: A | Discharge status: Home / Self Care | Payer: Medicaid Other | Source: Ambulatory Visit | Attending: Internal Medicine | Admitting: Internal Medicine

## 2014-04-11 ENCOUNTER — Other Ambulatory Visit (HOSPITAL_COMMUNITY): Payer: Self-pay | Admitting: Internal Medicine

## 2014-04-11 DIAGNOSIS — R062 Wheezing: Secondary | ICD-10-CM | POA: Insufficient documentation

## 2014-04-11 DIAGNOSIS — J449 Chronic obstructive pulmonary disease, unspecified: Secondary | ICD-10-CM | POA: Insufficient documentation

## 2014-04-11 DIAGNOSIS — R0781 Pleurodynia: Secondary | ICD-10-CM | POA: Insufficient documentation

## 2014-04-11 DIAGNOSIS — R0602 Shortness of breath: Secondary | ICD-10-CM | POA: Diagnosis not present

## 2014-04-11 DIAGNOSIS — W19XXXA Unspecified fall, initial encounter: Secondary | ICD-10-CM | POA: Diagnosis not present

## 2014-05-09 ENCOUNTER — Ambulatory Visit: Payer: Medicaid Other | Admitting: Neurology

## 2014-05-09 ENCOUNTER — Ambulatory Visit: Payer: Medicaid Other | Admitting: Adult Health

## 2014-05-13 ENCOUNTER — Encounter: Payer: Self-pay | Admitting: Adult Health

## 2014-05-13 ENCOUNTER — Ambulatory Visit (INDEPENDENT_AMBULATORY_CARE_PROVIDER_SITE_OTHER): Payer: Medicaid Other | Admitting: Adult Health

## 2014-05-13 VITALS — BP 99/64 | HR 93 | Ht 60.0 in | Wt 86.0 lb

## 2014-05-13 DIAGNOSIS — G1111 Friedreich ataxia: Secondary | ICD-10-CM

## 2014-05-13 DIAGNOSIS — G111 Early-onset cerebellar ataxia: Secondary | ICD-10-CM

## 2014-05-13 DIAGNOSIS — R269 Unspecified abnormalities of gait and mobility: Secondary | ICD-10-CM

## 2014-05-13 NOTE — Patient Instructions (Signed)
If you decide you would like to be evaluated by physical therapy and occupational therapy please let us know.  If you feel that different equipment would be beneficial for your care please let us know.

## 2014-05-13 NOTE — Progress Notes (Signed)
PATIENT: [name] DOB: 17-Dec-1970  REASON FOR VISIT: follow up- Friedreich's ataxia and abnormality of gait HISTORY FROM: patient  HISTORY OF PRESENT ILLNESS: [name] is a 45 year old female with a history of Friedreich's ataxia. She returns today for follow-up. The patient states that she is doing about the same but some days are worse. The patient recently was referred for physical and occupational therapy. She states that she has not started this. She wasn't aware that was ordered. She has a friend that stays with her 24 hours a day. She now wears oxygen due to her COPD. She is confined to a wheelchair. She states that Dr. Anne Hahn mentioned a motorized wheelchair but her caregiver has encouraged her not to use one due to her vision. She is able to bathe herself but requires assistance with transferring to the bathtub. At this time the patient is not interested in PT or OT evaluation.  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 She currently is living with a friend, and she has had gradual progressive difficulty with her balance and walking since she was in her early 24s. She is no longer able to ambulate effectively outside the house. Inside the house, she can walk by holding onto furniture. She was first seen through this office in 2007, and then again in 2011, and she has not been seen since that time. 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 It is not clear that she has ever had nerve conduction studies done. She denies problems controlling the bowels or the bladder. She has significant issues with asthma and COPD, and she is on oxygen at nighttime. She reports some numbness in the left greater right foot. She denies any pain. She has bilateral foot drops. She indicates that her maternal  grandmother also had a similar disorder. She comes to this office for an evaluation.  REVIEW OF SYSTEMS: Out of a complete 14 system review of symptoms, the patient complains only of the following symptoms, and all other reviewed systems are negative.  Shortness of breath, chest tightness, eye discharge, walking difficulty, weakness  ALLERGIES: No Known Allergies  HOME MEDICATIONS: Outpatient Prescriptions Prior to Visit  Medication Sig Dispense Refill  . budesonide-formoterol (SYMBICORT) 160-4.5 MCG/ACT inhaler Inhale 2 puffs into the lungs 2 (two) times daily.     Marland Kitchen ipratropium (ATROVENT) 0.02 % nebulizer solution Take 2.5 mLs (0.5 mg total) by nebulization 3 (three) times daily. Dx 496 360 mL 3  . albuterol (PROVENTIL) (2.5 MG/3ML) 0.083% nebulizer solution Take 3 mLs (2.5 mg total) by nebulization every 4 (four) hours as needed for wheezing or shortness of breath. Dx: 496 (Patient not taking: Reported on 05/13/2014) 300 mL 5  . albuterol (VENTOLIN HFA) 108 (90 BASE) MCG/ACT inhaler Inhale 2 puffs into the lungs every 4 (four) hours as needed for wheezing or shortness of breath. (Patient not taking: Reported on 05/13/2014) 18 g 5   No facility-administered medications prior to visit.    PAST MEDICAL HISTORY: Past Medical History  Diagnosis Date  . Arthritis   . COPD (chronic obstructive pulmonary disease)   . Respiratory failure, acute   . Diabetes mellitus     borderline  . Hx of toe surgery     X 2  . Ankle fracture     LEFT  . On supplemental  oxygen therapy     2 L continuous  . Friedreich's ataxia   . GERD (gastroesophageal reflux disease)   . Asthma     PAST SURGICAL HISTORY: Past Surgical History  Procedure Laterality Date  . Cholecystectomy    . Knee arthroscopy      bilateral  . Toe surgery      x2  . Orif ankle fracture  04/23/2012    Procedure: OPEN REDUCTION INTERNAL FIXATION (ORIF) ANKLE FRACTURE;  Surgeon: Javier DockerJeffrey C Beane, MD;  Location: [facility];  Service:  Orthopedics;  Laterality: Left;  . Hardware removal Left 06/16/2012    Procedure: HARDWARE REMOVAL;  Surgeon: Toni ArthursJohn Hewitt, MD;  Location: [facility];  Service: Orthopedics;  Laterality: Left;  left ankle  . Incision and drainage Left 06/16/2012    Procedure: INCISION AND DRAINAGE;  Surgeon: Toni ArthursJohn Hewitt, MD;  Location: [facility];  Service: Orthopedics;  Laterality: Left;  . Application of wound vac Left 06/16/2012    Procedure: APPLICATION OF WOUND VAC;  Surgeon: Toni ArthursJohn Hewitt, MD;  Location: [facility];  Service: Orthopedics;  Laterality: Left;    FAMILY HISTORY: Family History  Problem Relation Age of Onset  . Diabetes    . Asthma    . Seizures Mother   . Cancer - Colon Mother   . Emphysema Father   . Diabetes Father     SOCIAL HISTORY: History   Social History  . Marital Status: Single    Spouse Name: N/A    Number of Children: 2  . Years of Education: GED   Occupational History  . disabled    Social History Main Topics  . Smoking status: Former Smoker -- 0.50 packs/day for 10 years    Types: Cigarettes    Quit date: 01/30/2010  . Smokeless tobacco: Never Used  . Alcohol Use: No  . Drug Use: No  . Sexual Activity: Not on file   Other Topics Concern  . Not on file   Social History Narrative      PHYSICAL EXAM  Filed Vitals:   05/13/14 1517  BP: 99/64  Pulse: 93  Height: 5' (1.524 m)  Weight: 86 lb (39.009 kg)   Body mass index is 16.8 kg/(m^2).  Generalized: Frail, in no acute distress   Neurological examination  Mentation: Alert oriented to time, place, history taking. Follows all commands, speech is ataxic. Cranial nerve II-XII: Pupils were equal round reactive to light. Extraocular movements were full, visual field were full on confrontational test. Facial sensation and strength were normal. Uvula tongue midline. Head turning and shoulder shrug  were normal and symmetric. Motor: The motor testing reveals 5 over 5 strength of all 4 extremities. Good symmetric motor  tone is noted throughout.  Sensory: Sensory testing is intact to soft touch on all 4 extremities. No evidence of extinction is noted.  Coordination: Cerebellar testing reveals dysmetria with finger-nose-finger and heel-to-shin bilaterally.  Gait and station: Patient is confined to a wheelchair. Reflexes: Deep tendon reflexes are absent bilaterally.    DIAGNOSTIC DATA (LABS, IMAGING, TESTING) - I reviewed patient records, labs, notes, testing and imaging myself where available.  Lab Results  Component Value Date   WBC 4.8 09/27/2012   HGB 15.8* 09/27/2012   HCT 46.0 09/27/2012   MCV 102.0* 09/27/2012   PLT 128* 09/27/2012      Component Value Date/Time   NA 139 09/27/2012 1119   K 4.1 09/27/2012 1119   CL 103 09/27/2012 1119   CO2 27 09/27/2012 1119  GLUCOSE 90 09/27/2012 1119   BUN 7 09/27/2012 1119   CREATININE 0.36* 09/27/2012 1119   CREATININE 0.67 06/21/2012 0545   CALCIUM 8.8 09/27/2012 1119   PROT 7.5 08/02/2012 1219   ALBUMIN 4.6 08/02/2012 1219   AST 29 08/02/2012 1219   ALT 25 08/02/2012 1219   ALKPHOS 110 08/02/2012 1219   BILITOT 0.9 08/02/2012 1219   GFRNONAA >90 06/21/2012 0545   GFRAA >90 06/21/2012 0545    Lab Results  Component Value Date   HGBA1C  03/05/2009    5.1 (NOTE) The ADA recommends the following therapeutic goal for glycemic control related to Hgb A1c measurement: Goal of therapy: <6.5 Hgb A1c  Reference: American Diabetes Association: Clinical Practice Recommendations 2010, Diabetes Care, 2010, 33: (Suppl  1).   Lab Results  Component Value Date   VITAMINB12 879 07/21/2009       ASSESSMENT AND PLAN 45 y.o. year old female  has a past medical history of Arthritis; COPD (chronic obstructive pulmonary disease); Respiratory failure, acute; Diabetes mellitus; toe surgery; Ankle fracture; On supplemental oxygen therapy; Friedreich's ataxia; GERD (gastroesophageal reflux disease); and Asthma. here with:  1. Friedreich's ataxia 2.  Abnormality of gait 3. COPD  The patient has gotten slightly worse since the last visit she is now confined to a wheelchair. She does have a friend that provides 24 hour care. The patient was referred for physical and occupational therapy at the last visit. I have recommended that we do another referral today to evaluate her home situation and the need for certain equipment for her care. However the patient has refused. She states that she will let us know if that becomes a need. I have advised the patient that if her "friend" can no longer provide adequate care for her she should let us know so we can make a referral. The patient verbalized understanding. She will follow-up in 6 months or sooner if needed.     Butch Penny, MSN, NP-C 05/13/2014, 3:32 PM [facility] [address] [place], [place] [address] [phone]  Note: This document was prepared with digital dictation and possible smart phrase technology. Any transcriptional errors that result from this process are unintentional.

## 2014-05-13 NOTE — Progress Notes (Signed)
I have read the note, and I agree with the clinical assessment and plan.  Arwen Haseley KEITH   

## 2014-07-28 ENCOUNTER — Telehealth: Payer: Self-pay | Admitting: Pulmonary Disease

## 2014-07-28 ENCOUNTER — Encounter (HOSPITAL_COMMUNITY): Payer: Self-pay | Admitting: Emergency Medicine

## 2014-07-28 ENCOUNTER — Inpatient Hospital Stay (HOSPITAL_COMMUNITY)
Admission: EM | Admit: 2014-07-28 | Discharge: 2014-08-03 | DRG: 871 | Disposition: A | Discharge status: Home Health | Payer: Medicaid Other | Attending: Internal Medicine | Admitting: Internal Medicine

## 2014-07-28 ENCOUNTER — Emergency Department (HOSPITAL_COMMUNITY): Payer: Medicaid Other

## 2014-07-28 DIAGNOSIS — R0600 Dyspnea, unspecified: Secondary | ICD-10-CM | POA: Diagnosis not present

## 2014-07-28 DIAGNOSIS — W19XXXA Unspecified fall, initial encounter: Secondary | ICD-10-CM | POA: Diagnosis present

## 2014-07-28 DIAGNOSIS — J449 Chronic obstructive pulmonary disease, unspecified: Secondary | ICD-10-CM

## 2014-07-28 DIAGNOSIS — E119 Type 2 diabetes mellitus without complications: Secondary | ICD-10-CM | POA: Diagnosis present

## 2014-07-28 DIAGNOSIS — A419 Sepsis, unspecified organism: Principal | ICD-10-CM | POA: Diagnosis present

## 2014-07-28 DIAGNOSIS — J9621 Acute and chronic respiratory failure with hypoxia: Secondary | ICD-10-CM | POA: Diagnosis not present

## 2014-07-28 DIAGNOSIS — J90 Pleural effusion, not elsewhere classified: Secondary | ICD-10-CM | POA: Diagnosis not present

## 2014-07-28 DIAGNOSIS — M1990 Unspecified osteoarthritis, unspecified site: Secondary | ICD-10-CM | POA: Diagnosis present

## 2014-07-28 DIAGNOSIS — Z681 Body mass index (BMI) 19 or less, adult: Secondary | ICD-10-CM | POA: Diagnosis not present

## 2014-07-28 DIAGNOSIS — K219 Gastro-esophageal reflux disease without esophagitis: Secondary | ICD-10-CM | POA: Diagnosis present

## 2014-07-28 DIAGNOSIS — J9610 Chronic respiratory failure, unspecified whether with hypoxia or hypercapnia: Secondary | ICD-10-CM | POA: Diagnosis present

## 2014-07-28 DIAGNOSIS — Z993 Dependence on wheelchair: Secondary | ICD-10-CM | POA: Diagnosis not present

## 2014-07-28 DIAGNOSIS — I509 Heart failure, unspecified: Secondary | ICD-10-CM | POA: Diagnosis not present

## 2014-07-28 DIAGNOSIS — J181 Lobar pneumonia, unspecified organism: Secondary | ICD-10-CM | POA: Insufficient documentation

## 2014-07-28 DIAGNOSIS — Z87891 Personal history of nicotine dependence: Secondary | ICD-10-CM | POA: Diagnosis not present

## 2014-07-28 DIAGNOSIS — G111 Early-onset cerebellar ataxia: Secondary | ICD-10-CM | POA: Diagnosis present

## 2014-07-28 DIAGNOSIS — J189 Pneumonia, unspecified organism: Secondary | ICD-10-CM | POA: Diagnosis present

## 2014-07-28 DIAGNOSIS — Z9981 Dependence on supplemental oxygen: Secondary | ICD-10-CM | POA: Diagnosis not present

## 2014-07-28 DIAGNOSIS — J45909 Unspecified asthma, uncomplicated: Secondary | ICD-10-CM | POA: Diagnosis present

## 2014-07-28 DIAGNOSIS — Z833 Family history of diabetes mellitus: Secondary | ICD-10-CM

## 2014-07-28 DIAGNOSIS — E876 Hypokalemia: Secondary | ICD-10-CM | POA: Diagnosis present

## 2014-07-28 DIAGNOSIS — E43 Unspecified severe protein-calorie malnutrition: Secondary | ICD-10-CM | POA: Insufficient documentation

## 2014-07-28 DIAGNOSIS — Z79899 Other long term (current) drug therapy: Secondary | ICD-10-CM | POA: Diagnosis not present

## 2014-07-28 DIAGNOSIS — J441 Chronic obstructive pulmonary disease with (acute) exacerbation: Secondary | ICD-10-CM | POA: Diagnosis present

## 2014-07-28 DIAGNOSIS — G1111 Friedreich ataxia: Secondary | ICD-10-CM | POA: Diagnosis present

## 2014-07-28 DIAGNOSIS — Z825 Family history of asthma and other chronic lower respiratory diseases: Secondary | ICD-10-CM | POA: Diagnosis not present

## 2014-07-28 DIAGNOSIS — R0602 Shortness of breath: Secondary | ICD-10-CM

## 2014-07-28 DIAGNOSIS — R918 Other nonspecific abnormal finding of lung field: Secondary | ICD-10-CM

## 2014-07-28 LAB — CBC WITH DIFFERENTIAL/PLATELET
Basophils Absolute: 0 10*3/uL (ref 0.0–0.1)
Basophils Relative: 0 % (ref 0–1)
EOS PCT: 0 % (ref 0–5)
Eosinophils Absolute: 0 10*3/uL (ref 0.0–0.7)
HCT: 50.7 % — ABNORMAL HIGH (ref 36.0–46.0)
Hemoglobin: 15.9 g/dL — ABNORMAL HIGH (ref 12.0–15.0)
LYMPHS ABS: 0.7 10*3/uL (ref 0.7–4.0)
LYMPHS PCT: 12 % (ref 12–46)
MCH: 33.8 pg (ref 26.0–34.0)
MCHC: 31.4 g/dL (ref 30.0–36.0)
MCV: 107.9 fL — AB (ref 78.0–100.0)
Monocytes Absolute: 0.6 10*3/uL (ref 0.1–1.0)
Monocytes Relative: 10 % (ref 3–12)
NEUTROS PCT: 78 % — AB (ref 43–77)
Neutro Abs: 4.9 10*3/uL (ref 1.7–7.7)
Platelets: 235 10*3/uL (ref 150–400)
RBC: 4.7 MIL/uL (ref 3.87–5.11)
RDW: 15.6 % — ABNORMAL HIGH (ref 11.5–15.5)
WBC: 6.2 10*3/uL (ref 4.0–10.5)

## 2014-07-28 LAB — I-STAT CHEM 8, ED
BUN: 5 mg/dL — ABNORMAL LOW (ref 6–23)
Calcium, Ion: 1.03 mmol/L — ABNORMAL LOW (ref 1.12–1.23)
Chloride: 86 mmol/L — ABNORMAL LOW (ref 96–112)
Creatinine, Ser: 0.5 mg/dL (ref 0.50–1.10)
Glucose, Bld: 138 mg/dL — ABNORMAL HIGH (ref 70–99)
HEMATOCRIT: 55 % — AB (ref 36.0–46.0)
Hemoglobin: 18.7 g/dL — ABNORMAL HIGH (ref 12.0–15.0)
Potassium: 3 mmol/L — ABNORMAL LOW (ref 3.5–5.1)
SODIUM: 141 mmol/L (ref 135–145)
TCO2: 43 mmol/L (ref 0–100)

## 2014-07-28 LAB — BLOOD GAS, ARTERIAL
ACID-BASE EXCESS: 14.8 mmol/L — AB (ref 0.0–2.0)
BICARBONATE: 43.2 meq/L — AB (ref 20.0–24.0)
Drawn by: 244801
O2 CONTENT: 4 L/min
O2 SAT: 75.8 %
PATIENT TEMPERATURE: 98.6
TCO2: 37.3 mmol/L (ref 0–100)
pCO2 arterial: 68.3 mmHg (ref 35.0–45.0)
pH, Arterial: 7.417 (ref 7.350–7.450)
pO2, Arterial: 41.2 mmHg — ABNORMAL LOW (ref 80.0–100.0)

## 2014-07-28 MED ORDER — ALBUTEROL SULFATE (2.5 MG/3ML) 0.083% IN NEBU
2.5000 mg | INHALATION_SOLUTION | Freq: Once | RESPIRATORY_TRACT | Status: AC
  Administered 2014-07-28: 2.5 mg via RESPIRATORY_TRACT
  Filled 2014-07-28: qty 3

## 2014-07-28 MED ORDER — ENOXAPARIN SODIUM 30 MG/0.3ML ~~LOC~~ SOLN
30.0000 mg | SUBCUTANEOUS | Status: DC
  Administered 2014-07-28 – 2014-08-02 (×6): 30 mg via SUBCUTANEOUS
  Filled 2014-07-28 (×6): qty 0.3

## 2014-07-28 MED ORDER — ADULT MULTIVITAMIN W/MINERALS CH
1.0000 | ORAL_TABLET | Freq: Every day | ORAL | Status: DC
Start: 2014-07-28 — End: 2014-08-03
  Administered 2014-07-29 – 2014-08-03 (×6): 1 via ORAL
  Filled 2014-07-28 (×8): qty 1

## 2014-07-28 MED ORDER — ACETAMINOPHEN 650 MG RE SUPP: 650.0000 mg | Freq: Four times a day (QID) | RECTAL | Status: DC | PRN

## 2014-07-28 MED ORDER — ACETAMINOPHEN 325 MG PO TABS
650.0000 mg | ORAL_TABLET | Freq: Four times a day (QID) | ORAL | Status: DC | PRN
Start: 2014-07-28 — End: 2014-08-03

## 2014-07-28 MED ORDER — IPRATROPIUM-ALBUTEROL 0.5-2.5 (3) MG/3ML IN SOLN
3.0000 mL | Freq: Once | RESPIRATORY_TRACT | Status: AC
  Administered 2014-07-28: 3 mL via RESPIRATORY_TRACT
  Filled 2014-07-28: qty 3

## 2014-07-28 MED ORDER — SODIUM CHLORIDE 0.9 % IV SOLN: INTRAVENOUS | Status: DC

## 2014-07-28 MED ORDER — CEFTRIAXONE SODIUM IN DEXTROSE 20 MG/ML IV SOLN
1.0000 g | INTRAVENOUS | Status: DC
  Administered 2014-07-29 – 2014-08-02 (×5): 1 g via INTRAVENOUS
  Filled 2014-07-28 (×7): qty 50

## 2014-07-28 MED ORDER — IPRATROPIUM-ALBUTEROL 0.5-2.5 (3) MG/3ML IN SOLN
3.0000 mL | Freq: Four times a day (QID) | RESPIRATORY_TRACT | Status: DC
  Administered 2014-07-28: 3 mL via RESPIRATORY_TRACT

## 2014-07-28 MED ORDER — OXYCODONE HCL 5 MG PO TABS
5.0000 mg | ORAL_TABLET | ORAL | Status: DC | PRN
  Administered 2014-07-28 – 2014-08-01 (×2): 5 mg via ORAL
  Filled 2014-07-28 (×2): qty 1

## 2014-07-28 MED ORDER — METHYLPREDNISOLONE SODIUM SUCC 125 MG IJ SOLR
125.0000 mg | Freq: Once | INTRAMUSCULAR | Status: AC
  Administered 2014-07-28: 125 mg via INTRAVENOUS
  Filled 2014-07-28: qty 2

## 2014-07-28 MED ORDER — GUAIFENESIN ER 600 MG PO TB12
600.0000 mg | ORAL_TABLET | Freq: Two times a day (BID) | ORAL | Status: DC
  Administered 2014-07-28 – 2014-08-03 (×12): 600 mg via ORAL
  Filled 2014-07-28 (×12): qty 1

## 2014-07-28 MED ORDER — ONDANSETRON HCL 4 MG PO TABS: 4.0000 mg | ORAL_TABLET | Freq: Four times a day (QID) | ORAL | Status: DC | PRN

## 2014-07-28 MED ORDER — POLYETHYLENE GLYCOL 3350 17 G PO PACK: 17.0000 g | PACK | Freq: Every day | ORAL | Status: DC | PRN

## 2014-07-28 MED ORDER — LEVOFLOXACIN IN D5W 500 MG/100ML IV SOLN
500.0000 mg | Freq: Once | INTRAVENOUS | Status: AC
  Administered 2014-07-28: 500 mg via INTRAVENOUS
  Filled 2014-07-28: qty 100

## 2014-07-28 MED ORDER — METHYLPREDNISOLONE SODIUM SUCC 125 MG IJ SOLR
60.0000 mg | Freq: Four times a day (QID) | INTRAMUSCULAR | Status: DC
  Administered 2014-07-28 – 2014-07-29 (×5): 60 mg via INTRAVENOUS
  Administered 2014-07-30: 125 mg via INTRAVENOUS
  Administered 2014-07-30 – 2014-07-31 (×5): 60 mg via INTRAVENOUS
  Filled 2014-07-28: qty 2
  Filled 2014-07-28: qty 0.96
  Filled 2014-07-28: qty 2
  Filled 2014-07-28: qty 0.96
  Filled 2014-07-28 (×5): qty 2
  Filled 2014-07-28: qty 0.96
  Filled 2014-07-28 (×3): qty 2

## 2014-07-28 MED ORDER — POTASSIUM CHLORIDE CRYS ER 20 MEQ PO TBCR
40.0000 meq | EXTENDED_RELEASE_TABLET | Freq: Once | ORAL | Status: AC
  Administered 2014-07-28: 40 meq via ORAL
  Filled 2014-07-28: qty 2

## 2014-07-28 MED ORDER — ALUM & MAG HYDROXIDE-SIMETH 200-200-20 MG/5ML PO SUSP
30.0000 mL | Freq: Four times a day (QID) | ORAL | Status: DC | PRN
  Administered 2014-07-30 – 2014-07-31 (×2): 30 mL via ORAL
  Filled 2014-07-28 (×2): qty 30

## 2014-07-28 MED ORDER — POTASSIUM CHLORIDE IN NACL 40-0.9 MEQ/L-% IV SOLN
INTRAVENOUS | Status: DC
  Administered 2014-07-28: 100 mL/h via INTRAVENOUS
  Filled 2014-07-28 (×4): qty 1000

## 2014-07-28 MED ORDER — ONDANSETRON HCL 4 MG/2ML IJ SOLN
4.0000 mg | Freq: Four times a day (QID) | INTRAMUSCULAR | Status: DC | PRN
Start: 2014-07-28 — End: 2014-08-03
  Administered 2014-07-28 – 2014-08-02 (×3): 4 mg via INTRAVENOUS
  Filled 2014-07-28 (×3): qty 2

## 2014-07-28 MED ORDER — IPRATROPIUM-ALBUTEROL 0.5-2.5 (3) MG/3ML IN SOLN
3.0000 mL | RESPIRATORY_TRACT | Status: DC
  Administered 2014-07-28 – 2014-07-31 (×15): 3 mL via RESPIRATORY_TRACT
  Filled 2014-07-28 (×17): qty 3

## 2014-07-28 MED ORDER — DEXTROSE 5 % IV SOLN
500.0000 mg | INTRAVENOUS | Status: DC
  Administered 2014-07-29 – 2014-08-02 (×5): 500 mg via INTRAVENOUS
  Filled 2014-07-28 (×6): qty 500

## 2014-07-28 MED ORDER — ALBUTEROL SULFATE (2.5 MG/3ML) 0.083% IN NEBU: 2.5000 mg | INHALATION_SOLUTION | RESPIRATORY_TRACT | Status: DC

## 2014-07-28 NOTE — Telephone Encounter (Signed)
Called and spoke to pt's EC, Hurlburt Fieldlyde. Genevie CheshireClyde stated pt feels her SOB is so bad she feels she needs to go to ED. Pt c/o severe SOB at rest. Informed Clyde if pt feels she needs to be evaluated immediately then she needs to go to ED. Genevie CheshireClyde stated they will go to ED today.   Will forward to Dr. Vassie LollAlva to make aware.

## 2014-07-28 NOTE — ED Notes (Signed)
Bed: WA25 Expected date:  Expected time:  Means of arrival:  Comments: Ems SOB 

## 2014-07-28 NOTE — H&P (Addendum)
History and Physical:    [name]   XLK:[id number] DOB: [birth year] DOA: 07/28/2014  Referring physician: Dr. Estell HarpinZammit PCP: Dorrene GermanAVBUERE,EDWIN A, MD   Chief Complaint: Worsening dyspnea  History of Present Illness:   [name] is an 44 y.o. female with a PMH of Friedrich's ataxia/wheelchair bound, end stage COPD, chronic respiratory failure on 2 L home oxygen who presents with a 2-3 day history of worsening dyspnea.  Family reports that they have had flu like illness.  The patient has had her influenza vaccination but female friend Genevie Cheshire(Clyde) reports that he has not had the vaccine.  The patient reports that she has had some fever, chills, and a cough productive of thick green sputum, and a 3 day history of N/V as well. The patient sees Dr. Vassie LollAlva for her pulmonary issues, and has required mechanical ventilation in the past for COPD .  PFTs 4/11 FEV1 0.84 (34%).  She is currently getting a nebulizer treatment and is in moderate respiratory distress.  ROS:   Constitutional: + fever, + chills;  Appetite diminished; + weight loss, no weight gain, + fatigue.  HEENT: No blurry vision, no diplopia, no pharyngitis, no dysphagia CV: No chest pain, no palpitations, no PND, no orthopnea, no edema.  Resp: + SOB, + cough, no pleuritic pain. GI: + nausea, + vomiting, no diarrhea, no melena, no hematochezia, no constipation, no abdominal pain.  GU: No dysuria, no hematuria, no frequency, no urgency. MSK: no myalgias, no arthralgias.  Neuro:  No headache, no focal neurological deficits, no history of seizures.  Psych: No depression, no anxiety.  Endo: No heat intolerance, no cold intolerance, no polyuria, no polydipsia  Skin: No rashes, no skin lesions.  Heme: No easy bruising.  Travel history: No recent travel.   Past Medical History:   Past Medical History  Diagnosis Date  . Arthritis   . COPD (chronic obstructive pulmonary disease)   . Respiratory failure, acute   . Diabetes mellitus    borderline  . Hx of toe surgery     X 2  . Ankle fracture     LEFT  . On supplemental oxygen therapy     2 L continuous  . Friedreich's ataxia   . GERD (gastroesophageal reflux disease)   . Asthma     Past Surgical History:   Past Surgical History  Procedure Laterality Date  . Cholecystectomy    . Knee arthroscopy      bilateral  . Toe surgery      x2  . Orif ankle fracture  04/23/2012    Procedure: OPEN REDUCTION INTERNAL FIXATION (ORIF) ANKLE FRACTURE;  Surgeon: Javier DockerJeffrey C Beane, MD;  Location: [facility];  Service: Orthopedics;  Laterality: Left;  . Hardware removal Left 06/16/2012    Procedure: HARDWARE REMOVAL;  Surgeon: Toni ArthursJohn Hewitt, MD;  Location: [facility];  Service: Orthopedics;  Laterality: Left;  left ankle  . Incision and drainage Left 06/16/2012    Procedure: INCISION AND DRAINAGE;  Surgeon: Toni ArthursJohn Hewitt, MD;  Location: [facility];  Service: Orthopedics;  Laterality: Left;  . Application of wound vac Left 06/16/2012    Procedure: APPLICATION OF WOUND VAC;  Surgeon: Toni ArthursJohn Hewitt, MD;  Location: [facility];  Service: Orthopedics;  Laterality: Left;    Social History:   History   Social History  . Marital Status: Single    Spouse Name: N/A  . Number of Children: 2  . Years of Education: GED   Occupational History  .  disabled    Social History Main Topics  . Smoking status: Former Smoker -- 0.50 packs/day for 10 years    Types: Cigarettes    Quit date: 01/30/2010  . Smokeless tobacco: Never Used  . Alcohol Use: No  . Drug Use: No  . Sexual Activity: Not Currently   Other Topics Concern  . Not on file   Social History Narrative   Single  Clyde stays with her intermittently.  Her son lives with her as well.      Family history:   Family History  Problem Relation Age of Onset  . Diabetes    . Asthma    . Seizures Mother   . Cancer - Colon Mother   . Emphysema Father   . Diabetes Father     Allergies   Review of patient's allergies indicates no known  allergies.  Current Medications:   Prior to Admission medications   Medication Sig Start Date End Date Taking? Authorizing Provider  ipratropium (ATROVENT) 0.02 % nebulizer solution Take 2.5 mLs (0.5 mg total) by nebulization 3 (three) times daily. Dx 496 Patient taking differently: Take 0.5 mg by nebulization every 6 (six) hours.  07/09/13  Yes Oretha Milch, MD    Physical Exam:   Filed Vitals:   07/28/14 1500 07/28/14 1540 07/28/14 1623 07/28/14 1729  BP: 114/70 123/59 115/78   Pulse: 95 99 99   Temp:  98.1 F (36.7 C) 98.5 F (36.9 C)   TempSrc:  Oral Oral   Resp: Height:    5' (1.524 m)  Weight:    39.463 kg (87 lb)  SpO2: 88% 89% 87%      Physical Exam: Blood pressure 115/78, pulse 99, temperature 98.5 F (36.9 C), temperature source Oral, resp. rate 22, height 5' (1.524 m), weight 39.463 kg (87 lb), last menstrual period 07/15/2014, SpO2 87 %. Gen: Moderate respiratory distress. Head: Normocephalic, atraumatic. Eyes: PERRL, EOMI, sclerae nonicteric. Mouth: Oropharynx shows poor dentition. Neck: Supple, no thyromegaly, no lymphadenopathy, no jugular venous distention. Chest: Lungs markedly diminished with decreased air movement. CV: Heart sounds are tachy/regular. Abdomen: Soft, nontender, nondistended with normal active bowel sounds. Extremities: Extremities are without C/E/C. Skin: Warm and dry. Neuro: Alert and oriented times 3; cranial nerves II through XII grossly intact. Psych: Mood and affect anxious.   Data Review:    Labs: Basic Metabolic Panel:  Recent Labs Lab 07/28/14 1420  NA 141  K 3.0*  CL 86*  GLUCOSE 138*  BUN 5*  CREATININE 0.50   CBC:  Recent Labs Lab 07/28/14 1413 07/28/14 1420  WBC 6.2  --   NEUTROABS 4.9  --   HGB 15.9* 18.7*  HCT 50.7* 55.0*  MCV 107.9*  --   PLT 235  --     Radiographic Studies: Dg Chest Port 1 View  07/28/2014   CLINICAL DATA:  Chronic COPD. Acute shortness of breath for 3 days.  Decreased oxygen saturation.  EXAM: PORTABLE CHEST - 1 VIEW  COMPARISON:  04/11/2014  FINDINGS: Background COPD/emphysema noted with mild hyperinflation. Streaky increased lingula and left lower lobe bronchovascular opacity compared to the prior study, concerning for developing left lung pneumonia. Mild cardiomegaly. Normal vascularity. No effusion or pneumothorax. Trachea midline. Mild thoracic scoliosis noted.  IMPRESSION: Increased lingula and left lower lobe streaky bronchovascular airspace process concerning for pneumonia.  Hyperinflation.   Electronically Signed   By: Judie Petit.  Shick M.D.   On: 07/28/2014 14:11   *I have  personally reviewed the images above* EKG:    Assessment/Plan:   Principal Problem:   Acute on chronic respiratory failure secondary to CAP and COPD exacerbation - Has end stage COPD, on home oxygen at baseline.  Now with increased oxygen requirement. - CXR shows probable pneumonia. - Admit for IV antibiotics (Rocephin/Azithro), IV steroids, bronchodilators, Mucinex. - Check HIV, sputum culture, blood culture, strep pneumo/legionella antigens. - Check ABG.  Continue supplemental oxygen to maintain oxygen saturations.  Active Problems:   Friedreich's ataxia - Wheelchair bound. - PT evaluation when stable.    Hypokalemia - Replete orally and in IVF.    DVT prophylaxis - Lovenox ordered.  Code Status: Full. Family Communication: Clyde at bedside. Disposition Plan: Home when stable.  Time spent: 1 hour.  Noorah Giammona [facility] Pager [phone] Cell: [phone]   If 7PM-7AM, please contact night-coverage [email or web address] Password [facility] 07/28/2014, 6:45 PM

## 2014-07-28 NOTE — Progress Notes (Addendum)
This NP asked by attending to check on pt. Pt has a hx of COPD on 2LO2 at home and was admitted tonight with acute on chronic respiratory failure with CAP and COPD exacerbation. After increasing her O2 to 4L, she was satting fine on the nsg floor and RN considered pt to be stable enough to stay on unit. Low threshold to transfer per attending as pt has a hx of intubation in the past.  Went to bedside around 1915 hrs.  S: pt says she feels better than she did when she came in. Still SOB but not over baseline at home. No chest pain. O: Poor appearing, frail [age] yo WF appearing much older than stated age. Alert and oriented. No acute respiratory distress, but mild tachypnea. No use of accessory muscles at present.  A/P: 1. Acute on chronic respiratory failure with CAP and COPD exacerbation. After RN checked O2 sat, pt falling into the 80s on 4L. Instructed RN to change to 50% ventimask and pt continued to fall into the upper 80% range. Do not want to use long term NRB in this chronic COPD pt as her PCO2 was already high on her first ABG. Given this and her fragile respiratory hx, will transfer pt to SDU for probable bipap and closer monitoring. Recheck ABG 2 hours after this is started.  Jimmye NormanKaren Kirby-Graham, NP [facility] Update: Saw pt upon transfer to ICU. Non toxic appearing. Alert and oriented. Smiling. Slight tachypnea but no use of accessory muscles. Prior to transfer, rapid RN advanced to 1/2 NRB due to low sats, but sats climbed into high 90s. Therefore, changed back to 55% venti mask and O2 sats 88-91%. Occasional drop when active or talking. For now, will continue on venti mask and monitor carefully. Goal O2 sat 89-92% given chronic respiratory failure. Pt due 60mg  Solumedrol q 6h tonight. May need to increase dose. On ATC nebs. Monitor mental status. No ABG needed at this time, but may need to r/p ABG later. May need Bipap. Will place NPO except sips and chips for now in case we need to use  Bipap. Plan discussed with RN and RT.  Jimmye NormanKaren Kirby-Graham, NP [facility] Update: Pt has been satting 91-91% all night on 55% ventimask. Her mental status is still intact. BP a little low but she seems to run on the low side and her MAP is 65. She has not been tachycardic. Still wheezing some, but overall, stable since moving to SDU. Continue venti for now. Will report events to oncoming attending at 0700. CXR if not done in past 24 hrs.  KJKG, NP

## 2014-07-28 NOTE — Progress Notes (Addendum)
Received pt a few minutes ago as a transfer pt from general care floor on venturi mask.  Per RN, hold off on placing pt on bipap at this time.  Per RN, Donnamarie PoagK Kirby, NP will be coming to bedside to reassess pt.  RN will call RT when/if bipap needs to be placed on pt.

## 2014-07-28 NOTE — Progress Notes (Addendum)
ABG Results:  PH 7.41 CO2 68.3  PO2 41.2  BiCarb 43.2  Sats 75.8%  Dr. Darnelle Catalanama notified  Tauren Delbuono, Bernette RedbirdEmily H, RN

## 2014-07-28 NOTE — ED Provider Notes (Signed)
CSN: [id number]     Arrival date & time 07/28/14  1320 History   First MD Initiated Contact with Patient 07/28/14 1334     Chief Complaint  Patient presents with  . Shortness of Breath     (Consider location/radiation/quality/duration/timing/severity/associated sxs/prior Treatment) Patient is a 44 y.o. female presenting with shortness of breath. The history is provided by the patient (pt complains of cough and sob.  hx copd).  Shortness of Breath Severity:  Moderate Onset quality:  Gradual Timing:  Constant Progression:  Worsening Chronicity:  New Context: activity   Relieved by:  Inhaler Associated symptoms: no abdominal pain, no chest pain, no cough, no headaches and no rash     Past Medical History  Diagnosis Date  . Arthritis   . COPD (chronic obstructive pulmonary disease)   . Respiratory failure, acute   . Diabetes mellitus     borderline  . Hx of toe surgery     X 2  . Ankle fracture     LEFT  . On supplemental oxygen therapy     2 L continuous  . Friedreich's ataxia   . GERD (gastroesophageal reflux disease)   . Asthma    Past Surgical History  Procedure Laterality Date  . Cholecystectomy    . Knee arthroscopy      bilateral  . Toe surgery      x2  . Orif ankle fracture  04/23/2012    Procedure: OPEN REDUCTION INTERNAL FIXATION (ORIF) ANKLE FRACTURE;  Surgeon: Javier DockerJeffrey C Beane, MD;  Location: [facility];  Service: Orthopedics;  Laterality: Left;  . Hardware removal Left 06/16/2012    Procedure: HARDWARE REMOVAL;  Surgeon: Toni ArthursJohn Hewitt, MD;  Location: [facility];  Service: Orthopedics;  Laterality: Left;  left ankle  . Incision and drainage Left 06/16/2012    Procedure: INCISION AND DRAINAGE;  Surgeon: Toni ArthursJohn Hewitt, MD;  Location: [facility];  Service: Orthopedics;  Laterality: Left;  . Application of wound vac Left 06/16/2012    Procedure: APPLICATION OF WOUND VAC;  Surgeon: Toni ArthursJohn Hewitt, MD;  Location: [facility];  Service: Orthopedics;  Laterality: Left;   Family History   Problem Relation Age of Onset  . Diabetes    . Asthma    . Seizures Mother   . Cancer - Colon Mother   . Emphysema Father   . Diabetes Father    History  Substance Use Topics  . Smoking status: Former Smoker -- 0.50 packs/day for 10 years    Types: Cigarettes    Quit date: 01/30/2010  . Smokeless tobacco: Never Used  . Alcohol Use: No   OB History    No data available     Review of Systems  Constitutional: Negative for appetite change and fatigue.  HENT: Negative for congestion, ear discharge and sinus pressure.   Eyes: Negative for discharge.  Respiratory: Positive for shortness of breath. Negative for cough.   Cardiovascular: Negative for chest pain.  Gastrointestinal: Negative for abdominal pain and diarrhea.  Genitourinary: Negative for frequency and hematuria.  Musculoskeletal: Negative for back pain.  Skin: Negative for rash.  Neurological: Negative for seizures and headaches.  Psychiatric/Behavioral: Negative for hallucinations.      Allergies  Review of patient's allergies indicates no known allergies.  Home Medications   Prior to Admission medications   Medication Sig Start Date End Date Taking? Authorizing Provider  ipratropium (ATROVENT) 0.02 % nebulizer solution Take 2.5 mLs (0.5 mg total) by nebulization 3 (three) times daily. Dx 496 Patient taking  differently: Take 0.5 mg by nebulization every 6 (six) hours.  07/09/13  Yes Oretha Milch, MD   BP 114/70 mmHg  Pulse 95  Temp(Src) 98.4 F (36.9 C) (Axillary)  Resp 24  SpO2 88% Physical Exam  Constitutional: She is oriented to person, place, and time. She appears well-developed.  HENT:  Head: Normocephalic.  Eyes: Conjunctivae and EOM are normal. No scleral icterus.  Neck: Neck supple. No thyromegaly present.  Cardiovascular: Normal rate and regular rhythm.  Exam reveals no gallop and no friction rub.   No murmur heard. Pulmonary/Chest: No stridor. She has wheezes. She has no rales. She exhibits  no tenderness.  Abdominal: She exhibits no distension. There is no tenderness. There is no rebound.  Musculoskeletal: Normal range of motion. She exhibits no edema.  Lymphadenopathy:    She has no cervical adenopathy.  Neurological: She is oriented to person, place, and time. She exhibits normal muscle tone. Coordination normal.  Skin: No rash noted. No erythema.  Psychiatric: She has a normal mood and affect. Her behavior is normal.    ED Course  Procedures (including critical care time) Labs Review Labs Reviewed  CBC WITH DIFFERENTIAL/PLATELET - Abnormal; Notable for the following:    Hemoglobin 15.9 (*)    HCT 50.7 (*)    MCV 107.9 (*)    RDW 15.6 (*)    Neutrophils Relative % 78 (*)    All other components within normal limits  I-STAT CHEM 8, ED - Abnormal; Notable for the following:    Potassium 3.0 (*)    Chloride 86 (*)    BUN 5 (*)    Glucose, Bld 138 (*)    Calcium, Ion 1.03 (*)    Hemoglobin 18.7 (*)    HCT 55.0 (*)    All other components within normal limits    Imaging Review Dg Chest Port 1 View  07/28/2014   CLINICAL DATA:  Chronic COPD. Acute shortness of breath for 3 days. Decreased oxygen saturation.  EXAM: PORTABLE CHEST - 1 VIEW  COMPARISON:  04/11/2014  FINDINGS: Background COPD/emphysema noted with mild hyperinflation. Streaky increased lingula and left lower lobe bronchovascular opacity compared to the prior study, concerning for developing left lung pneumonia. Mild cardiomegaly. Normal vascularity. No effusion or pneumothorax. Trachea midline. Mild thoracic scoliosis noted.  IMPRESSION: Increased lingula and left lower lobe streaky bronchovascular airspace process concerning for pneumonia.  Hyperinflation.   Electronically Signed   By: Judie Petit.  Shick M.D.   On: 07/28/2014 14:11     EKG Interpretation None      MDM   Final diagnoses:  SOB (shortness of breath)  COPD exacerbation    Admit for copd exacerbation    Bethann Berkshire, MD 07/28/14 [phone]

## 2014-07-28 NOTE — ED Notes (Signed)
Per EMS, Pt from home hx of COPD. Pt c/o SOB about 3 days. Pt hasn't had any breathing medications at home due to cost. Pt sts she was N/V 3 days ago. Pt also c/o chest pain due to SOB. A&Ox4. 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 Pt received 125 solumedrol, 10 albuterol, .5 atrovent en route.

## 2014-07-29 ENCOUNTER — Inpatient Hospital Stay (HOSPITAL_COMMUNITY): Payer: Medicaid Other

## 2014-07-29 DIAGNOSIS — A419 Sepsis, unspecified organism: Principal | ICD-10-CM

## 2014-07-29 DIAGNOSIS — E43 Unspecified severe protein-calorie malnutrition: Secondary | ICD-10-CM | POA: Insufficient documentation

## 2014-07-29 LAB — BASIC METABOLIC PANEL
ANION GAP: 8 (ref 5–15)
BUN: 7 mg/dL (ref 6–23)
CALCIUM: 7.9 mg/dL — AB (ref 8.4–10.5)
CO2: 38 mmol/L — AB (ref 19–32)
CREATININE: 0.35 mg/dL — AB (ref 0.50–1.10)
Chloride: 95 mmol/L — ABNORMAL LOW (ref 96–112)
GFR calc non Af Amer: >90 mL/min (ref >90)
GLUCOSE: 144 mg/dL — AB (ref 70–99)
Potassium: 5.3 mmol/L — ABNORMAL HIGH (ref 3.5–5.1)
Sodium: 141 mmol/L (ref 135–145)

## 2014-07-29 LAB — LACTIC ACID, PLASMA
Lactic Acid, Venous: 2.6 mmol/L (ref 0.5–2.0)
Lactic Acid, Venous: 5.9 mmol/L (ref 0.5–2.0)
Lactic Acid, Venous: 2.4 mmol/L (ref 0.5–2.0)

## 2014-07-29 LAB — GLUCOSE, CAPILLARY
Glucose-Capillary: 124 mg/dL — ABNORMAL HIGH (ref 70–99)
Glucose-Capillary: 200 mg/dL — ABNORMAL HIGH (ref 70–99)
Glucose-Capillary: 128 mg/dL — ABNORMAL HIGH (ref 70–99)
Glucose-Capillary: 104 mg/dL — ABNORMAL HIGH (ref 70–99)

## 2014-07-29 LAB — MRSA PCR SCREENING: MRSA by PCR: NEGATIVE

## 2014-07-29 LAB — PROCALCITONIN: Procalcitonin: <0.1 ng/mL

## 2014-07-29 LAB — INFLUENZA PANEL BY PCR (TYPE A & B)
H1N1 flu by pcr: NOT DETECTED
INFLAPCR: NEGATIVE
Influenza B By PCR: NEGATIVE

## 2014-07-29 LAB — STREP PNEUMONIAE URINARY ANTIGEN: Strep Pneumo Urinary Antigen: NEGATIVE

## 2014-07-29 LAB — HIV ANTIBODY (ROUTINE TESTING W REFLEX): HIV Screen 4th Generation wRfx: NONREACTIVE

## 2014-07-29 MED ORDER — IPRATROPIUM-ALBUTEROL 0.5-2.5 (3) MG/3ML IN SOLN: 3.0000 mL | RESPIRATORY_TRACT | Status: DC | PRN

## 2014-07-29 MED ORDER — CETYLPYRIDINIUM CHLORIDE 0.05 % MT LIQD
7.0000 mL | Freq: Two times a day (BID) | OROMUCOSAL | Status: DC
  Administered 2014-07-29 – 2014-08-03 (×9): 7 mL via OROMUCOSAL

## 2014-07-29 MED ORDER — ENSURE ENLIVE PO LIQD
237.0000 mL | Freq: Three times a day (TID) | ORAL | Status: DC
  Administered 2014-07-29 – 2014-08-03 (×14): 237 mL via ORAL

## 2014-07-29 MED ORDER — SODIUM CHLORIDE 0.9 % IV SOLN
Freq: Once | INTRAVENOUS | Status: AC
  Administered 2014-07-29: 20:00:00 via INTRAVENOUS

## 2014-07-29 MED ORDER — INSULIN ASPART 100 UNIT/ML ~~LOC~~ SOLN
0.0000 [IU] | Freq: Three times a day (TID) | SUBCUTANEOUS | Status: DC
  Administered 2014-07-29: 1 [IU] via SUBCUTANEOUS
  Administered 2014-07-29 – 2014-07-30 (×2): 2 [IU] via SUBCUTANEOUS
  Administered 2014-07-30 – 2014-07-31 (×2): 1 [IU] via SUBCUTANEOUS
  Administered 2014-07-31 – 2014-08-01 (×3): 2 [IU] via SUBCUTANEOUS
  Administered 2014-08-01: 5 [IU] via SUBCUTANEOUS
  Administered 2014-08-02: 1 [IU] via SUBCUTANEOUS
  Administered 2014-08-02: 3 [IU] via SUBCUTANEOUS
  Administered 2014-08-02: 2 [IU] via SUBCUTANEOUS

## 2014-07-29 MED ORDER — ENSURE ENLIVE PO LIQD: 237.0000 mL | Freq: Two times a day (BID) | ORAL | Status: DC

## 2014-07-29 MED ORDER — SODIUM CHLORIDE 0.9 % IV SOLN
INTRAVENOUS | Status: DC
  Administered 2014-07-29 – 2014-07-31 (×5): via INTRAVENOUS

## 2014-07-29 MED ORDER — SODIUM CHLORIDE 0.9 % IV SOLN
Freq: Once | INTRAVENOUS | Status: AC
  Administered 2014-07-29: 16:00:00 via INTRAVENOUS

## 2014-07-29 NOTE — Progress Notes (Signed)
INITIAL NUTRITION ASSESSMENT  DOCUMENTATION CODES Per approved criteria  -Severe malnutrition in the context of chronic illness   Pt meets the criteria for severe MALNUTRITION in the context of chronic illness as evidenced by severe fat and muscle wasting.  INTERVENTION: Provide Ensure Enlive TID, each provides 350 kcal and 20g protein  RD will continue to monitor  NUTRITION DIAGNOSIS: Malnutrition related to difficulty breathing as evidenced by severe protein/fat wasting.   Goal: Pt to meet >/= 90% of their estimated energy needs  Monitor:  PO, I/Os, labs, weight trends  Reason for Assessment: Diet consult for Assessment of nutrition requirements/status  44 y.o. female  Admitting Dx: COPD exacerbation  ASSESSMENT: Pt is a 44 y.o. female with a PMH of Friedrich's ataxia/wheelchair bound, end stage COPD, chronic respiratory failure on 2 L home oxygen who presents with a 2-3 day history of worsening dyspnea.  Pt sitting in bed upon visit. Hard to understand due to face mask and labored breathing. Per Pt, she has been eating pretty good lately, but was not able to finish last night's dinner due to fatigue. Stated that she ate most of her breakfast, but tray by the bedside still had over half of the food left. States her usual weight is 84 - 87 Lb. Admits liking Ensure type drinks. RD to order supplements.   Labs reviewed: Glu 144, K 5.3, Cl 95  Nutrition Focused Physical Exam:  Subcutaneous Fat:  Orbital Region: moderate depletion Upper Arm Region: severe depletion Thoracic and Lumbar Region: n/a  Muscle:  Temple Region: severe depletion Clavicle Bone Region: severe depletion Clavicle and Acromion Bone Region: severe depletion Scapular Bone Region: severe depletion Dorsal Hand: severe depletion Patellar Region: severe depletion Anterior Thigh Region: severe depletion Posterior Calf Region: severe depletion  Edema: none noted  Height: Ht Readings from Last 1  Encounters:  07/28/14 5' (1.524 m)    Weight: Wt Readings from Last 1 Encounters:  07/29/14 77 lb 9.6 oz (35.2 kg)    Ideal Body Weight: 105  % Ideal Body Weight: 74%  Wt Readings from Last 10 Encounters:  07/29/14 77 lb 9.6 oz (35.2 kg)  05/13/14 86 lb (39.009 kg)  11/06/13 86 lb (39.009 kg)  07/09/13 82 lb (37.195 kg)  09/27/12 81 lb (36.741 kg)  08/09/12 87 lb (39.463 kg)  07/16/12 78 lb (35.381 kg)  07/05/12 87 lb (39.463 kg)  06/16/12 88 lb 10 oz (40.2 kg)  04/23/12 85 lb 8.6 oz (38.8 kg)    Usual Body Weight: 85  % Usual Body Weight: 91%  BMI:  Body mass index is 15.16 kg/(m^2).  Estimated Nutritional Needs: Kcal: 1200 - 1400 Protein: 70 - 80 Fluid: >1.5L  Skin: WDL  Diet Order: Diet regular Room service appropriate?: Yes; Fluid consistency:: Thin  EDUCATION NEEDS: -No education needs identified at this time   Intake/Output Summary (Last 24 hours) at 07/29/14 0908 Last data filed at 07/29/14 0600  Gross per 24 hour  Intake   1545 ml  Output    425 ml  Net   1120 ml    Last BM: PTA   Labs:   Recent Labs Lab 07/28/14 1420 07/29/14 0418  NA 141 141  K 3.0* 5.3*  CL 86* 95*  CO2  --  38*  BUN 5* 7  CREATININE 0.50 0.35*  CALCIUM  --  7.9*  GLUCOSE 138* 144*    CBG (last 3)   Recent Labs  07/29/14 0805  GLUCAP 124*    Scheduled  Meds: . azithromycin  500 mg Intravenous Q24H  . cefTRIAXone (ROCEPHIN)  IV  1 g Intravenous Q24H  . enoxaparin (LOVENOX) injection  30 mg Subcutaneous Q24H  . guaiFENesin  600 mg Oral BID  . ipratropium-albuterol  3 mL Nebulization Q4H  . methylPREDNISolone (SOLU-MEDROL) injection  60 mg Intravenous Q6H  . multivitamin with minerals  1 tablet Oral Daily    Continuous Infusions: . 0.9 % NaCl with KCl 40 mEq / L 100 mL/hr (07/28/14 1713)    Past Medical History  Diagnosis Date  . Arthritis   . COPD (chronic obstructive pulmonary disease)   . Respiratory failure, acute   . Diabetes mellitus      borderline  . Hx of toe surgery     X 2  . Ankle fracture     LEFT  . On supplemental oxygen therapy     2 L continuous  . Friedreich's ataxia   . GERD (gastroesophageal reflux disease)   . Asthma     Past Surgical History  Procedure Laterality Date  . Cholecystectomy    . Knee arthroscopy      bilateral  . Toe surgery      x2  . Orif ankle fracture  04/23/2012    Procedure: OPEN REDUCTION INTERNAL FIXATION (ORIF) ANKLE FRACTURE;  Surgeon: Javier Docker, MD;  Location: [facility];  Service: Orthopedics;  Laterality: Left;  . Hardware removal Left 06/16/2012    Procedure: HARDWARE REMOVAL;  Surgeon: Toni Arthurs, MD;  Location: [facility];  Service: Orthopedics;  Laterality: Left;  left ankle  . Incision and drainage Left 06/16/2012    Procedure: INCISION AND DRAINAGE;  Surgeon: Toni Arthurs, MD;  Location: [facility];  Service: Orthopedics;  Laterality: Left;  . Application of wound vac Left 06/16/2012    Procedure: APPLICATION OF WOUND VAC;  Surgeon: Toni Arthurs, MD;  Location: [facility];  Service: Orthopedics;  Laterality: Left;    Elba Dendinger A. [facility] Dietetic Intern Pager: [phone] 07/29/2014 9:26 AM

## 2014-07-29 NOTE — Progress Notes (Signed)
CARE MANAGEMENT NOTE 07/29/2014  Patient:  Quillian QuinceHUTCHENS,Meelah L   Account Number:  [id number]  Date Initiated:  07/29/2014  Documentation initiated by:  DAVIS,RHONDA  Subjective/Objective Assessment:   pt with hx of copd now with excerbation of requiring 40% o2/iv steriods and iv abx, nebs     Action/Plan:   hx of requiring vent in the past.  from home with home o2   Anticipated DC Date:  08/01/2014   Anticipated DC Plan:  HOME W HOME HEALTH SERVICES  In-house referral  NA      DC Planning Services  CM consult      [facility]  NA   Choice offered to / List presented to:  NA           Status of service:  In process, will continue to follow Medicare Important Message given?   (If response is "NO", the following Medicare IM given date fields will be blank) Date Medicare IM given:   Medicare IM given by:   Date Additional Medicare IM given:   Additional Medicare IM given by:    Discharge Disposition:    Per UR Regulation:  Reviewed for med. necessity/level of care/duration of stay  If discussed at Long Length of Stay Meetings, dates discussed:    Comments:  July 29, 2014/Rhonda L. Earlene Plateravis, RN, BSN, CCM. Case Management [employer] [phone] No discharge needs present of time of review.

## 2014-07-29 NOTE — Evaluation (Signed)
Physical Therapy Evaluation Patient Details Name: [name] MRN: [id number] DOB: [birth year] Today's Date: 07/29/2014   History of Present Illness  Pt admitted with acute respiratory failure due to CAP and COPD exacerbation. 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  Clinical Impression  On eval, pt required Min assist for mobility (+2 for equipment/lines/O2)-able to ambulate ~15 feet with RW in room. Sats dropped to 83% on VM O2. Pt tolerated activity fairly well. Will follow during hospital stay. Recommend HHPT if possible at discharge.     Follow Up Recommendations Supervision/Assistance - 24 hour;Home health PT (if possible)    Equipment Recommendations  None recommended by PT    Recommendations for Other Services OT consult     Precautions / Restrictions Precautions Precautions: Fall Precaution Comments: watch sats Restrictions Weight Bearing Restrictions: No      Mobility  Bed Mobility Overal bed mobility: Needs Assistance Bed Mobility: Supine to Sit     Supine to sit: Supervision        Transfers Overall transfer level: Needs assistance Equipment used: Rolling walker (2 wheeled) Transfers: Sit to/from Stand Sit to Stand: Supervision            Ambulation/Gait Ambulation/Gait assistance: Min assist Ambulation Distance (Feet): 15 Feet Assistive device: Rolling walker (2 wheeled) Gait Pattern/deviations: Step-through pattern;Decreased stride length     General Gait Details: Assist to stabilize and maneuver safely with RW. Pt moves quickly and out of control. Remained on VM O2-sats dropped to 83%  Stairs            Wheelchair Mobility    Modified Rankin (Stroke Patients Only)       Balance Overall balance assessment: Needs assistance         Standing balance support: Bilateral upper extremity supported;During functional activity Standing balance-Leahy Scale: Poor                               Pertinent  Vitals/Pain Pain Assessment: Faces Faces Pain Scale: Hurts a little bit Pain Location: L UE at IV site Pain Descriptors / Indicators: Throbbing Pain Intervention(s): Repositioned;Other (comment) (made RN aware)    Home Living Family/patient expects to be discharged to:: Private residence Living Arrangements: Spouse/significant other;Children (son) Available Help at Discharge: Family;Available 24 hours/day Type of Home: Mobile home Home Access: Stairs to enter Entrance Stairs-Rails: Right;Left;Can reach both Entrance Stairs-Number of Steps: 5 Home Layout: One level Home Equipment: Walker - 2 wheels;Wheelchair - Careers adviser (comment) (02, 2L)      Prior Function Level of Independence: Needs assistance   Gait / Transfers Assistance Needed: walked short distances with RW  ADL's / Homemaking Assistance Needed: assisted to get down in tub, son and husband assist with IADL        Hand Dominance   Dominant Hand: Right    Extremity/Trunk Assessment   Upper Extremity Assessment: Defer to OT evaluation           Lower Extremity Assessment: Generalized weakness      Cervical / Trunk Assessment: Normal  Communication   Communication: No difficulties  Cognition Arousal/Alertness: Awake/alert Behavior During Therapy: Impulsive Overall Cognitive Status: Within Functional Limits for tasks assessed                      General Comments      Exercises        Assessment/Plan    PT Assessment Patient needs  continued PT services  PT Diagnosis Difficulty walking;Generalized weakness;Acute pain   PT Problem List Decreased strength;Decreased activity tolerance;Decreased balance;Decreased mobility;Cardiopulmonary status limiting activity;Pain  PT Treatment Interventions DME instruction;Gait training;Functional mobility training;Therapeutic activities;Therapeutic exercise;Balance training;Patient/family education   PT Goals (Current goals can be found in the Care Plan  section) Acute Rehab PT Goals Patient Stated Goal: return home with family PT Goal Formulation: With patient Time For Goal Achievement: 08/12/14 Potential to Achieve Goals: Good    Frequency Min 3X/week   Barriers to discharge        Co-evaluation   Reason for Co-Treatment: For patient/therapist safety (lines)   OT goals addressed during session: ADL's and self-care       End of Session Equipment Utilized During Treatment: Oxygen Activity Tolerance: Patient limited by fatigue Patient left: in chair;with call bell/phone within reach           Time: 1350-1425 PT Time Calculation (min) (ACUTE ONLY): 35 min   Charges:   PT Evaluation $Initial PT Evaluation Tier I: 1 Procedure     PT G Codes:        Rebeca AlertJannie Gunda Maqueda, MPT Pager: [phone]

## 2014-07-29 NOTE — Progress Notes (Signed)
CSW received consult for COPD Gold Protocol, though patient does not meet qualifications (only 1 admission within the past 6 months).   CSW signing off.   Suzanna Kidd, MSW, LCSW Clinical Social Work [phone] 

## 2014-07-29 NOTE — Progress Notes (Signed)
Notified of lactic acid result  Manson PasseyAlma Laryah Neuser, MD

## 2014-07-29 NOTE — Progress Notes (Signed)
CRITICAL VALUE ALERT  Critical value received:  Lactic acid 2.6  Date of notification:  07/29/2014  Time of notification:  1134  Critical value read back:Yes.    Nurse who received alert:  Sheryle HailGrigg,Oneta Sigman J, RN  MD notified (1st page):  Manson PasseyAlma [name], MD  Time of first page:  1235  MD notified (2nd page):  Time of second page:  Responding MD:  Manson PasseyAlma [name], MD   Time MD responded:  [phone]

## 2014-07-29 NOTE — Progress Notes (Signed)
CRITICAL VALUE ALERT  Critical value received:  Lactic acid 5.9  Date of notification:  07/29/2014  Time of notification:  1357  Critical value read back:Yes.    Nurse who received alert:  Sheryle HailGrigg,Annamay Laymon J, RN  MD notified (1st page): Manson PasseyAlma [name], MD  Time of first page:  1425  MD notified (2nd page): Manson PasseyAlma [name], MD  Time of second page: 1529  Responding MD:  Manson PasseyAlma [name], MD  Time MD responded:  [phone]

## 2014-07-29 NOTE — Progress Notes (Addendum)
Patient ID: [name], female   DOB: [birth year], 44 y.o.   MRN: [id number] [facility] PROGRESS NOTE  [name] [id number] DOB: October 28, 1970 DOA: 07/28/2014 PCP: Philis Fendt, MD  Brief narrative:    44 y.o. female with a PMH of Friedrich's ataxia/wheelchair bound, end stage COPD, chronic respiratory failure on 2 L home oxygen who presented with a 2-3 day history of worsening dyspnea, associated fevers, chills and cough productive of thick green sputum. She also had associated nausea and vomiting for past few days prior to this admission.  Overnight, patient in more respiratory distress. Her oxygen saturation was in 80s with 4 L nasal cannula oxygen support. She was moved to stepdown unit 07/28/2014, placed on 50% Ventimask and this morning she still Ventimask saturating 93%.  Assessment/Plan:    Principal Problem: Acute on chronic respiratory failure with hypoxia secondary to CAP and COPD exacerbation - Hypoxia secondary to combination of end-stage COPD and community-acquired pneumonia. - Chest x-ray on the admission showed pneumonia in lingular and left lower lobe area. Patient was started on azithromycin and Rocephin since the admission. Follow-up blood culture results, influenza panel, strep pneumonia, Legionella and respiratory culture results. - She is in mild distress since she is now on a Ventimask for oxygen support. Her current oxygen saturation is 93%. - Continue DuoNeb every 4 hours scheduled and every 4 hours as needed for shortness of breath or wheezing - We will continue current steroid regimen, Solu-Medrol 60 mg IV every 6 hours - COPD Gold alert order set placed. - Because of high oxygen requirements patient will stay in step down unit for next 24 hours.   Active Problems: Sepsis secondary to lobar pneumonia - Sepsis criteria met on admission considering hypothermia and temperature of 96.63F, tachycardia of 126, tachypnea of 24, hypotension 81/60  and still low at 94/65. In addition patient severely hypoxic with oxygen saturation as low as 75% requiring Ventimask for oxygen support. Source of infection pneumonia as seen on chest x-ray in lingular and left lower lobe area - Sepsis order set placed 08/07/14. Follow-up lactic acid and procalcitonin level. Follow-up blood culture results. - Continue azithromycin and Rocephin for targeted treatment of pneumonia.   Friedreich's ataxia - Wheelchair bound. - Follow-up on PT, OT evaluation. Order placed.   Hypokalemia - Secondary to nebulizer treatments. Supplemented. Now potassium 5.3. - Hopefully will come down with nebulizer treatments. Repeat BMP in the morning.   DVT prophylaxis - Lovenox ordered while patient is in hospital.   Code Status: Full.  Family Communication:  plan of care discussed with the patient Disposition Plan: Not stable for discharge. It remains in step down unit. She is receiving IV antibiotics for pneumonia. Blood cultures are pending, Legionella and strep pneumonia as well as respiratory culture are all pending.  IV access:  Peripheral IV  Procedures and diagnostic studies:    Dg Chest Port 1 View 2014/08/07   Regressed left lung base pneumonia. Chronic lung disease with hyperinflation.    Dg Chest Port 1 View 07/28/2014  Increased lingula and left lower lobe streaky bronchovascular airspace process concerning for pneumonia.  Hyperinflation.    Medical Consultants:  None   Other Consultants:  Nutrition Physical therapy Occupational therapy  IAnti-Infectives:   Azithromycin 07/28/2014 --> Rocephin 07/28/2014 -->    Leisa Lenz, MD  [facility] Pager [phone]  If 7PM-7AM, please contact night-coverage [email or web address] Password [facility] 08-07-14, 9:27 AM   LOS: 1 day    HPI/Subjective: No acute overnight  events.  Objective: Filed Vitals:   07/29/14 0600 07/29/14 0641 07/29/14 0800 07/29/14 0819  BP: 81/60 94/65    Pulse: 79 66     Temp:   97.7 F (36.5 C)   TempSrc:   Oral   Resp: 20 16    Height:      Weight:      SpO2: 92% 92%  93%    Intake/Output Summary (Last 24 hours) at 07/29/14 0927 Last data filed at 07/29/14 0600  Gross per 24 hour  Intake   1545 ml  Output    425 ml  Net   1120 ml    Exam:   General:  Pt is alert, follows commands appropriately, not in acute distress  Cardiovascular: Regular rate and rhythm, S1/S2, no murmurs  Respiratory: rhonchi appreciated bilaterally, wheezing +)  Abdomen: Soft, non tender, non distended, bowel sounds present  Extremities: No edema, pulses DP and PT palpable bilaterally  Neuro: Grossly nonfocal  Data Reviewed: Basic Metabolic Panel:  Recent Labs Lab 07/28/14 1420 07/29/14 0418  NA 141 141  K 3.0* 5.3*  CL 86* 95*  CO2  --  38*  GLUCOSE 138* 144*  BUN 5* 7  CREATININE 0.50 0.35*  CALCIUM  --  7.9*   Liver Function Tests: No results for input(s): AST, ALT, ALKPHOS, BILITOT, PROT, ALBUMIN in the last 168 hours. No results for input(s): LIPASE, AMYLASE in the last 168 hours. No results for input(s): AMMONIA in the last 168 hours. CBC:  Recent Labs Lab 07/28/14 1413 07/28/14 1420  WBC 6.2  --   NEUTROABS 4.9  --   HGB 15.9* 18.7*  HCT 50.7* 55.0*  MCV 107.9*  --   PLT 235  --    Cardiac Enzymes: No results for input(s): CKTOTAL, CKMB, CKMBINDEX, TROPONINI in the last 168 hours. BNP: Invalid input(s): POCBNP CBG:  Recent Labs Lab 07/29/14 0805  GLUCAP 124*    Recent Results (from the past 240 hour(s))  MRSA PCR Screening     Status: None   Collection Time: 07/28/14 10:26 PM  Result Value Ref Range Status   MRSA by PCR NEGATIVE NEGATIVE Final    Comment:        The GeneXpert MRSA Assay (FDA approved for NASAL specimens only), is one component of a comprehensive MRSA colonization surveillance program. It is not intended to diagnose MRSA infection nor to guide or monitor treatment for MRSA infections.       Scheduled Meds: . azithromycin  500 mg Intravenous Q24H  . cefTRIAXone (ROCEPHIN)  IV  1 g Intravenous Q24H  . enoxaparin (LOVENOX) injection  30 mg Subcutaneous Q24H  . guaiFENesin  600 mg Oral BID  . ipratropium-albuterol  3 mL Nebulization Q4H  . methylPREDNISolone (SOLU-MEDROL) injection  60 mg Intravenous Q6H  . multivitamin with minerals  1 tablet Oral Daily   Continuous Infusions: . 0.9 % NaCl with KCl 40 mEq / L 100 mL/hr (07/28/14 1713)

## 2014-07-29 NOTE — Evaluation (Signed)
Occupational Therapy Evaluation Patient Details Name: [name] MRN: [id number] DOB: 1970-06-05 Today's Date: 07/29/2014    History of Present Illness Pt admitted with acute respiratory failure due to CAP and COPD exacerbation. 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   Clinical Impression   Pt ambulated short distances with a RW, bathed seated down in the tub, fed and dressed herself.  She was assisted with IADL and tub transfer prior to admission. Pt presents with decreased activity tolerance with 02 sats dropping to 83% on 10L with venti mask. She requires min assist with RW for mobility and ADL due to impaired balance. Will follow acutely to address ADL and determine benefit of tub equipment.    Follow Up Recommendations  Home health OT    Equipment Recommendations  Other (comment) (TBD)    Recommendations for Other Services       Precautions / Restrictions Precautions Precautions: Fall Precaution Comments: watch sats Restrictions Weight Bearing Restrictions: No      Mobility Bed Mobility Overal bed mobility: Needs Assistance Bed Mobility: Supine to Sit     Supine to sit: Supervision        Transfers Overall transfer level: Needs assistance Equipment used: Rolling walker (2 wheeled) Transfers: Sit to/from Stand Sit to Stand: Supervision              Balance                                            ADL Overall ADL's : Needs assistance/impaired Eating/Feeding: Set up;Sitting   Grooming: Wash/dry hands;Set up;Sitting   Upper Body Bathing: Minimal assitance;Sitting   Lower Body Bathing: Minimal assistance;Sit to/from stand   Upper Body Dressing : Set up;Sitting   Lower Body Dressing: Minimal assistance;Sit to/from stand   Toilet Transfer: Minimal assistance;Ambulation;RW   Toileting- Clothing Manipulation and Hygiene: Minimal assistance;Sit to/from stand       Functional mobility during ADLs: Minimal  assistance;Rolling walker       Vision     Perception     Praxis      Pertinent Vitals/Pain Pain Assessment: Faces Faces Pain Scale: Hurts a little bit Pain Location: L UE at IV site Pain Descriptors / Indicators: Throbbing Pain Intervention(s): Repositioned;Other (comment) (RN notified)     Hand Dominance Right   Extremity/Trunk Assessment Upper Extremity Assessment Upper Extremity Assessment: Generalized weakness   Lower Extremity Assessment Lower Extremity Assessment: Defer to PT evaluation       Communication Communication Communication: No difficulties   Cognition Arousal/Alertness: Awake/alert Behavior During Therapy: Impulsive Overall Cognitive Status: Within Functional Limits for tasks assessed                     General Comments       Exercises       Shoulder Instructions      Home Living Family/patient expects to be discharged to:: Private residence Living Arrangements: Spouse/significant other;Children (son) Available Help at Discharge: Family;Available 24 hours/day Type of Home: Mobile home Home Access: Stairs to enter Entrance Stairs-Number of Steps: 5 Entrance Stairs-Rails: Right;Left;Can reach both Home Layout: One level     Bathroom Shower/Tub: Chief Strategy Officer: Standard     Home Equipment: Environmental consultant - 2 wheels;Wheelchair - manual;Other (comment) (02, 2L)          Prior Functioning/Environment Level of Independence:  Needs assistance  Gait / Transfers Assistance Needed: walked short distances with RW ADL's / Homemaking Assistance Needed: assisted to get down in tub, son and husband assist with IADL        OT Diagnosis: Generalized weakness;Ataxia   OT Problem List: Decreased strength;Decreased activity tolerance;Impaired balance (sitting and/or standing);Decreased coordination;Cardiopulmonary status limiting activity   OT Treatment/Interventions: Self-care/ADL training;Energy conservation;DME and/or  AE instruction;Therapeutic activities;Patient/family education    OT Goals(Current goals can be found in the care plan section) Acute Rehab OT Goals Patient Stated Goal: return home with family OT Goal Formulation: With patient Time For Goal Achievement: 08/05/14 Potential to Achieve Goals: Good ADL Goals Pt Will Perform Grooming: with supervision;standing Pt Will Perform Lower Body Bathing: with supervision;sit to/from stand Pt Will Perform Lower Body Dressing: with supervision;sit to/from stand Pt Will Transfer to Toilet: with supervision;ambulating;regular height toilet Pt Will Perform Toileting - Clothing Manipulation and hygiene: with supervision;sit to/from stand Additional ADL Goal #1: Pt will utilize energy conservation strategies in mobility and ADL with min verbal cues.  OT Frequency: Min 2X/week   Barriers to D/C:            Co-evaluation PT/OT/SLP Co-Evaluation/Treatment: Yes Reason for Co-Treatment: For patient/therapist safety (lines)   OT goals addressed during session: ADL's and self-care      End of Session Equipment Utilized During Treatment: Rolling walker;Oxygen Nurse Communication: Mobility status  Activity Tolerance: Treatment limited secondary to medical complications (Comment) (02 sats to 83 on ventimask 10 L) Patient left: in chair;with call bell/phone within reach   Time: 1350-1425 OT Time Calculation (min): 35 min Charges:  OT General Charges $OT Visit: 1 Procedure OT Evaluation $Initial OT Evaluation Tier I: 1 Procedure G-Codes:    [name], Bartholomew Ramesh Lynn 07/29/2014, 3:17 PM  [phone]

## 2014-07-29 NOTE — Progress Notes (Signed)
CRITICAL VALUE ALERT  Critical value received:  Lactic Acid 2.4  Date of notification:  07/29/14  Time of notification:  2100  Critical value read back yes  Nurse who received alert:  Marcelino DusterMichelle   MD notified (1st page):  Kirby,NP  Time of first page:  2200  MD notified (2nd page):0  Time of second page:0  Responding MD:  Craige CottaKirby  Time MD responded:  2210

## 2014-07-30 DIAGNOSIS — J181 Lobar pneumonia, unspecified organism: Secondary | ICD-10-CM

## 2014-07-30 LAB — BASIC METABOLIC PANEL
Anion gap: 4 — ABNORMAL LOW (ref 5–15)
BUN: 7 mg/dL (ref 6–23)
CHLORIDE: 99 mmol/L (ref 96–112)
CO2: 34 mmol/L — AB (ref 19–32)
Calcium: 7.3 mg/dL — ABNORMAL LOW (ref 8.4–10.5)
Creatinine, Ser: <0.3 mg/dL — ABNORMAL LOW (ref 0.50–1.10)
Glucose, Bld: 178 mg/dL — ABNORMAL HIGH (ref 70–99)
Potassium: 5 mmol/L (ref 3.5–5.1)
SODIUM: 137 mmol/L (ref 135–145)

## 2014-07-30 LAB — GLUCOSE, CAPILLARY
Glucose-Capillary: 155 mg/dL — ABNORMAL HIGH (ref 70–99)
Glucose-Capillary: 108 mg/dL — ABNORMAL HIGH (ref 70–99)
Glucose-Capillary: 140 mg/dL — ABNORMAL HIGH (ref 70–99)
Glucose-Capillary: 170 mg/dL — ABNORMAL HIGH (ref 70–99)

## 2014-07-30 LAB — LEGIONELLA ANTIGEN, URINE

## 2014-07-30 LAB — HEMOGLOBIN A1C
HEMOGLOBIN A1C: 5.6 % (ref 4.8–5.6)
MEAN PLASMA GLUCOSE: 114 mg/dL

## 2014-07-30 LAB — LACTIC ACID, PLASMA
Lactic Acid, Venous: 2.5 mmol/L (ref 0.5–2.0)
Lactic Acid, Venous: 1.3 mmol/L (ref 0.5–2.0)

## 2014-07-30 MED ORDER — PANTOPRAZOLE SODIUM 40 MG PO TBEC
40.0000 mg | DELAYED_RELEASE_TABLET | Freq: Every day | ORAL | Status: DC
  Administered 2014-07-30 – 2014-08-02 (×4): 40 mg via ORAL
  Filled 2014-07-30 (×4): qty 1

## 2014-07-30 MED ORDER — FAMOTIDINE 20 MG PO TABS
20.0000 mg | ORAL_TABLET | Freq: Two times a day (BID) | ORAL | Status: DC | PRN
  Administered 2014-07-30: 20 mg via ORAL
  Filled 2014-07-30: qty 1

## 2014-07-30 NOTE — Progress Notes (Signed)
Pt currently on 1 LPM Anthony and tolerating well at this time, Pt in no distress at this time.  RT will hold BIPAP at this time, RT to monitor and assess throughout the night.

## 2014-07-30 NOTE — Progress Notes (Signed)
Patient ID: [name], female   DOB: 08/07/1970, 44 y.o.   MRN: [id number] [facility] PROGRESS NOTE  [name] [id number]:[id number] DOB: 1970-09-09 DOA: 07/28/2014 PCP: Philis Fendt, MD  Brief narrative:    44 y.o. female with a PMH of Friedrich's ataxia/wheelchair bound, end stage COPD, chronic respiratory failure on 2 L home oxygen who presented with a 2-3 day history of worsening dyspnea, associated fevers, chills and cough productive of thick green sputum. She also had associated nausea and vomiting for past few days prior to this admission.  Patient was in more respiratory distress the night of 07/28/2014. She has required a Ventimask for oxygen support. She was subsequently transferred to stepdown unit. Hospital course complicated with ongoing hypotension.   Assessment/Plan:    Principal Problem: Acute on chronic respiratory failure with hypoxia secondary to CAP and COPD exacerbation - Hypoxia secondary to combination of end-stage COPD and community-acquired pneumonia. - Chest x-ray done on the admission showed pneumonia in lingular and left lower lobe area.  - Azithromycin and Rocephin started on the admission. Blood culture results so far negative. Respiratory culture reading incubated for better growth. HIV antibody nonreactive, strep pneumonia negative. Influenza panel negative. Legionella is pending. - Continue DuoNeb every 4 hours as scheduled and every 4 hours as needed for shortness of breath or wheezing. - Patient still has rhonchi and wheezing on physical exam. We will continue Solu-Medrol 60 mg IV but decreased from every 6 hours to every 8 hours. - Because of hypotension we will continue to monitor in step down unit. - Respiratory status is slightly better compared with yesterday.   Active Problems: Sepsis secondary to lobar pneumonia - Sepsis criteria met on admission considering hypothermia and temperature of 96.21F, tachycardia of 126, tachypnea of  24, hypotension 81/60. In addition patient severely hypoxic with oxygen saturation as low as 75% requiring Ventimask for oxygen support. Source of infection pneumonia as seen on chest x-ray in lingular and left lower lobe area. - Sepsis order set was placed. Lactic acid was 5.9 but trending down and within normal limits this morning. Procalcitonin WNL. - Continue azithromycin and Rocephin.   Friedreich's ataxia - Wheelchair bound. - Follow-up physical therapy recommendations.   Hypokalemia - Secondary to nebulizer treatments. Supplemented. Potassium then elevated at 5.3. - We repeated BMP this morning and it shows normal potassium level.   DVT prophylaxis - Lovenox ordered while patient is in hospital.   Code Status: Full.  Family Communication:  plan of care discussed with the patient Disposition Plan: Not stable for discharge. Now hypotensive. Remains in the stepdown unit.  IV access:  Peripheral IV  Procedures and diagnostic studies:    Dg Chest Port 1 View 08-10-2014   Regressed left lung base pneumonia. Chronic lung disease with hyperinflation.    Dg Chest Port 1 View 07/28/2014  Increased lingula and left lower lobe streaky bronchovascular airspace process concerning for pneumonia.  Hyperinflation.    Medical Consultants:  None   Other Consultants:  Nutrition Physical therapy Occupational therapy  IAnti-Infectives:   Azithromycin 07/28/2014 --> Rocephin 07/28/2014 -->    Leisa Lenz, MD  [facility] Pager [phone]  If 7PM-7AM, please contact night-coverage [email or web address] Password [facility] 07/30/2014, 9:17 AM   LOS: 2 days    HPI/Subjective: No acute overnight events.  Objective: Filed Vitals:   07/30/14 0312 07/30/14 0400 07/30/14 0600 07/30/14 0800  BP:  91/49 85/64 106/64  Pulse:  70 66 69  Temp:   98.6 F (37 C)  97.8 F (36.6 C)  TempSrc:   Oral Oral  Resp:  12 14 14   Height:      Weight:      SpO2: 100% 100% 95% 97%    Intake/Output  Summary (Last 24 hours) at 07/30/14 8841 Last data filed at 07/30/14 0800  Gross per 24 hour  Intake 6978.16 ml  Output    475 ml  Net 6503.16 ml    Exam:   General:  Pt is awake, has nasal cannula for oxygen, no distress  Cardiovascular: Regular rate and rhythm, S1/S2 appreciated  Respiratory: Rhonchi in upper lung lobes with mild wheezing.  Abdomen: Nontender, nondistended abdomen, appreciate bowel sounds  Extremities: No LE swelling, pulses palpable bilaterally  Neuro: No focal deficits  Data Reviewed: Basic Metabolic Panel:  Recent Labs Lab 07/28/14 1420 07/29/14 0418 07/30/14 0150  NA 141 141 137  K 3.0* 5.3* 5.0  CL 86* 95* 99  CO2  --  38* 34*  GLUCOSE 138* 144* 178*  BUN 5* 7 7  CREATININE 0.50 0.35* <0.30*  CALCIUM  --  7.9* 7.3*   Liver Function Tests: No results for input(s): AST, ALT, ALKPHOS, BILITOT, PROT, ALBUMIN in the last 168 hours. No results for input(s): LIPASE, AMYLASE in the last 168 hours. No results for input(s): AMMONIA in the last 168 hours. CBC:  Recent Labs Lab 07/28/14 1413 07/28/14 1420  WBC 6.2  --   NEUTROABS 4.9  --   HGB 15.9* 18.7*  HCT 50.7* 55.0*  MCV 107.9*  --   PLT 235  --    Cardiac Enzymes: No results for input(s): CKTOTAL, CKMB, CKMBINDEX, TROPONINI in the last 168 hours. BNP: Invalid input(s): POCBNP CBG:  Recent Labs Lab 07/29/14 0805 07/29/14 1226 07/29/14 1805 07/29/14 2121 07/30/14 0808  GLUCAP 124* 200* 128* 104* 155*    Recent Results (from the past 240 hour(s))  Culture, blood (routine x 2) Call MD if unable to obtain prior to antibiotics being given     Status: None (Preliminary result)   Collection Time: 07/28/14  7:55 PM  Result Value Ref Range Status   Specimen Description BLOOD RIGHT WRIST  Final   Special Requests AEB 2CC  Final   Culture   Final           BLOOD CULTURE RECEIVED NO GROWTH TO DATE CULTURE WILL BE HELD FOR 5 DAYS BEFORE ISSUING A FINAL NEGATIVE REPORT Note: Culture  results may be compromised due to an inadequate volume of blood received in culture bottles. Performed at [facility]    Report Status PENDING  Incomplete  Culture, blood (routine x 2) Call MD if unable to obtain prior to antibiotics being given     Status: None (Preliminary result)   Collection Time: 07/28/14  8:00 PM  Result Value Ref Range Status   Specimen Description BLOOD RIGHT ARM  Final   Special Requests AEB 5CC  Final   Culture   Final           BLOOD CULTURE RECEIVED NO GROWTH TO DATE CULTURE WILL BE HELD FOR 5 DAYS BEFORE ISSUING A FINAL NEGATIVE REPORT Performed at [facility]    Report Status PENDING  Incomplete  MRSA PCR Screening     Status: None   Collection Time: 07/28/14 10:26 PM  Result Value Ref Range Status   MRSA by PCR NEGATIVE NEGATIVE Final    Comment:        The GeneXpert MRSA Assay (FDA approved  for NASAL specimens only), is one component of a comprehensive MRSA colonization surveillance program. It is not intended to diagnose MRSA infection nor to guide or monitor treatment for MRSA infections.   Culture, respiratory (NON-Expectorated)     Status: None (Preliminary result)   Collection Time: 07/29/14  1:22 AM  Result Value Ref Range Status   Specimen Description SPU  Final   Special Requests NONE  Final   Gram Stain   Final    MODERATE WBC PRESENT,BOTH PMN AND MONONUCLEAR RARE SQUAMOUS EPITHELIAL CELLS PRESENT FEW GRAM POSITIVE COCCI IN PAIRS IN CHAINS RARE GRAM POSITIVE RODS RARE GRAM NEGATIVE RODS    Culture   Final    Culture reincubated for better growth Performed at [facility]    Report Status PENDING  Incomplete     Scheduled Meds: . antiseptic oral rinse  7 mL Mouth Rinse BID  . azithromycin  500 mg Intravenous Q24H  . cefTRIAXone (ROCEPHIN)  IV  1 g Intravenous Q24H  . enoxaparin (LOVENOX) injection  30 mg Subcutaneous Q24H  . feeding supplement (ENSURE ENLIVE)  237 mL Oral TID BM  . guaiFENesin   600 mg Oral BID  . insulin aspart  0-9 Units Subcutaneous TID WC  . ipratropium-albuterol  3 mL Nebulization Q4H  . methylPREDNISolone (SOLU-MEDROL) injection  60 mg Intravenous Q6H  . multivitamin with minerals  1 tablet Oral Daily   Continuous Infusions: . sodium chloride 100 mL/hr at 07/30/14 0700

## 2014-07-30 NOTE — Progress Notes (Signed)
Physical Therapy Treatment Patient Details Name: [name] MRN: [id number] DOB: Apr 22, 1971 Today's Date: 07/30/2014    History of Present Illness Pt admitted with acute respiratory failure due to CAP and COPD exacerbation. 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    PT Comments    Progressing with mobility. Pt now on [place] O2-sats drop to low 80s with ambulation. Returned to low 90s with rest. BP 127/79 sitting upright.    Follow Up Recommendations  Home health PT;Supervision/Assistance - 24 hour (if possible)     Equipment Recommendations  None recommended by PT    Recommendations for Other Services OT consult     Precautions / Restrictions Precautions Precautions: Fall Precaution Comments: watch sats Restrictions Weight Bearing Restrictions: No    Mobility  Bed Mobility Overal bed mobility: Needs Assistance Bed Mobility: Supine to Sit     Supine to sit: Supervision        Transfers Overall transfer level: Needs assistance Equipment used: Rolling walker (2 wheeled) Transfers: Sit to/from Stand Sit to Stand: Min guard         General transfer comment: close guard for safety  Ambulation/Gait Ambulation/Gait assistance: Min assist Ambulation Distance (Feet): 75 Feet Assistive device: Rolling walker (2 wheeled) Gait Pattern/deviations: Step-through pattern;Trunk flexed;Decreased stride length;Ataxic     General Gait Details: Assist to stabilize and maneuver safely with RW. Pt moves quickly, gait is mild-mod ataxic. Remained on [place] O2-sats dropped to low 80s%.    Stairs            Wheelchair Mobility    Modified Rankin (Stroke Patients Only)       Balance           Standing balance support: Bilateral upper extremity supported;During functional activity Standing balance-Leahy Scale: Poor                      Cognition Arousal/Alertness: Awake/alert Behavior During Therapy: Impulsive Overall Cognitive Status: Within  Functional Limits for tasks assessed                      Exercises      General Comments        Pertinent Vitals/Pain Pain Assessment: No/denies pain    Home Living                      Prior Function            PT Goals (current goals can now be found in the care plan section) Progress towards PT goals: Progressing toward goals    Frequency  Min 3X/week    PT Plan Current plan remains appropriate    Co-evaluation             End of Session Equipment Utilized During Treatment: Oxygen Activity Tolerance: Patient tolerated treatment well Patient left: in chair;with call bell/phone within reach;with family/visitor present     Time: [id number] PT Time Calculation (min) (ACUTE ONLY): 21 min  Charges:  $Gait Training: 8-22 mins                    G Codes:      Rebeca AlertJannie Adien Kimmel, MPT Pager: [phone]

## 2014-07-31 ENCOUNTER — Inpatient Hospital Stay (HOSPITAL_COMMUNITY): Payer: Medicaid Other

## 2014-07-31 LAB — GLUCOSE, CAPILLARY
GLUCOSE-CAPILLARY: 143 mg/dL — AB (ref 70–99)
GLUCOSE-CAPILLARY: 143 mg/dL — AB (ref 70–99)
Glucose-Capillary: 179 mg/dL — ABNORMAL HIGH (ref 70–99)
Glucose-Capillary: 164 mg/dL — ABNORMAL HIGH (ref 70–99)

## 2014-07-31 LAB — CULTURE, RESPIRATORY

## 2014-07-31 LAB — CULTURE, RESPIRATORY W GRAM STAIN: Culture: NORMAL

## 2014-07-31 MED ORDER — METHYLPREDNISOLONE SODIUM SUCC 125 MG IJ SOLR
60.0000 mg | Freq: Two times a day (BID) | INTRAMUSCULAR | Status: DC
  Administered 2014-07-31 – 2014-08-02 (×4): 60 mg via INTRAVENOUS
  Filled 2014-07-31 (×4): qty 2

## 2014-07-31 MED ORDER — IPRATROPIUM-ALBUTEROL 0.5-2.5 (3) MG/3ML IN SOLN
3.0000 mL | Freq: Four times a day (QID) | RESPIRATORY_TRACT | Status: DC
  Administered 2014-07-31 – 2014-08-03 (×10): 3 mL via RESPIRATORY_TRACT
  Filled 2014-07-31 (×9): qty 3

## 2014-07-31 MED ORDER — CALCIUM CARBONATE-VITAMIN D 500-200 MG-UNIT PO TABS
1.0000 | ORAL_TABLET | Freq: Two times a day (BID) | ORAL | Status: DC
  Administered 2014-07-31 – 2014-08-03 (×6): 1 via ORAL
  Filled 2014-07-31 (×3): qty 1
  Filled 2014-07-31: qty 10
  Filled 2014-07-31 (×2): qty 1

## 2014-07-31 NOTE — Progress Notes (Signed)
Patient ID: [name], female   DOB: 02/23/1971, 44 y.o.   MRN: [id number] [facility] PROGRESS NOTE  [name] MRN:[id number] DOB: 09/11/1970 DOA: 07/28/2014 PCP: AVBUERE,EDWIN A, MD  Brief narrative:    44 y.o. female with a PMH of Friedrich's ataxia/wheelchair bound, end stage COPD, chronic respiratory failure on 2 L home oxygen who presented with a 2-3 day history of worsening dyspnea, associated fevers, chills and cough productive of thick green sputum. She also had associated nausea and vomiting for past few days prior to this admission.  Patient was in more respiratory distress the night of 07/28/2014. She has required a Ventimask for oxygen support. She stayed in step down unit because of hypotension which has improved with IV fluids.   Assessment/Plan:    Principal Problem: Acute on chronic respiratory failure with hypoxia secondary to CAP and COPD exacerbation - Hypoxia secondary to combination of end-stage COPD and community-acquired pneumonia. Evidence of pneumonia seen on chest x-ray done on the admission which showed pneumonia in lingular and left lower lobe. - Patient was started on azithromycin and Rocephin from the admission. So far blood cultures are negative. Respiratory culture was re-incubated for better growth, pending. HIV antibody nonreactive, strep pneumonia, influenza and Legionella are all negative. - Respiratory status better this morning. - We will continue same and laser regimen, DuoNeb every 4 hours scheduled and every 4 hours as needed for shortness of breath or wheezing - Continue Solu-Medrol 60 mg IV but reduce the frequency from every 6 hours down to every 12 hours. - Patient is stable to transfer to telemetry today.   Active Problems: Sepsis secondary to lobar pneumonia - Sepsis criteria met on admission with VS significant for hypothermia (96.7F), HR 126, RR 24, BP 81/60. In addition patient severely hypoxic with oxygen saturation  as low as 75% requiring Ventimask for oxygen support. Source of infection pneumonia which was seen on chest x-ray done on the admission. Of note, lactic acid was elevated as well. Procalcitonin WNL. - Lactic acid is within normal limits. Blood cultures so far negative. - We will continue azithromycin and Rocephin.   Friedreich's ataxia - Wheelchair bound. - Follow-up physical therapy recommendations.   Hypokalemia - Secondary to nebulizer treatments. Supplemented and then elevated at 5.3. - Repeat potassium within normal limits.   DVT prophylaxis - Lovenox ordered while patient is in hospital.   Code Status: Full.  Family Communication:  plan of care discussed with the patient Disposition Plan: Patient stable to transfer out of the stepdown unit to telemetry. Order placed.  IV access:  Peripheral IV  Procedures and diagnostic studies:    Dg Chest Port 1 View 07/29/2014   Regressed left lung base pneumonia. Chronic lung disease with hyperinflation.    Dg Chest Port 1 View 07/28/2014  Increased lingula and left lower lobe streaky bronchovascular airspace process concerning for pneumonia.  Hyperinflation.    Medical Consultants:  None   Other Consultants:  Nutrition Physical therapy Occupational therapy  IAnti-Infectives:   Azithromycin 07/28/2014 --> Rocephin 07/28/2014 -->    [name], [name], MD  [facility] Pager [phone]  If 7PM-7AM, please contact night-coverage [email or web address] Password [email or web address] 07/31/2014, 8:41 AM   LOS: 3 days    HPI/Subjective: No acute overnight events.  Objective: Filed Vitals:   07/31/14 0400 07/31/14 0600 07/31/14 0700 07/31/14 0800  BP: 109/62 85/60 92/70 101/66  Pulse:      Temp: 98 F (36.7 C)     TempSrc: Oral       Resp: _0 Height:      Weight:      SpO2: 98% 97% 96% 97%    Intake/Output Summary (Last 24 hours) at 07/31/14 0841 Last data filed at 07/31/14 0800  Gross per 24 hour  Intake   2420 ml  Output    1375 ml  Net   1045 ml    Exam:   General:  Pt is alert, no distress  Cardiovascular: RRR, (+) S1, S2  Respiratory: Rhonchi still appreciated with mild wheezing in upper lung lobes  Abdomen: Soft, appreciate bowel sounds, nontender abdomen  Extremities: No edema, pulses palpable  Neuro: Nonfocal  Data Reviewed: Basic Metabolic Panel:  Recent Labs Lab 07/28/14 1420 07/29/14 0418 07/30/14 0150  NA 141 141 137  K 3.0* 5.3* 5.0  CL 86* 95* 99  CO2  --  38* 34*  GLUCOSE 138* 144* 178*  BUN 5* 7 7  CREATININE 0.50 0.35* <0.30*  CALCIUM  --  7.9* 7.3*   Liver Function Tests: No results for input(s): AST, ALT, ALKPHOS, BILITOT, PROT, ALBUMIN in the last 168 hours. No results for input(s): LIPASE, AMYLASE in the last 168 hours. No results for input(s): AMMONIA in the last 168 hours. CBC:  Recent Labs Lab 07/28/14 1413 07/28/14 1420  WBC 6.2  --   NEUTROABS 4.9  --   HGB 15.9* 18.7*  HCT 50.7* 55.0*  MCV 107.9*  --   PLT 235  --    Cardiac Enzymes: No results for input(s): CKTOTAL, CKMB, CKMBINDEX, TROPONINI in the last 168 hours. BNP: Invalid input(s): POCBNP CBG:  Recent Labs Lab 07/29/14 2121 07/30/14 0808 07/30/14 1130 07/30/14 1602 07/30/14 2136  GLUCAP 104* 155* 108* 140* 170*    Recent Results (from the past 240 hour(s))  Culture, blood (routine x 2) Call MD if unable to obtain prior to antibiotics being given     Status: None (Preliminary result)   Collection Time: 07/28/14  7:55 PM  Result Value Ref Range Status   Specimen Description BLOOD RIGHT WRIST  Final   Special Requests AEB 2CC  Final   Culture   Final           BLOOD CULTURE RECEIVED NO GROWTH TO DATE CULTURE WILL BE HELD FOR 5 DAYS BEFORE ISSUING A FINAL NEGATIVE REPORT Note: Culture results may be compromised due to an inadequate volume of blood received in culture bottles. Performed at [facility]    Report Status PENDING  Incomplete  Culture, blood (routine x 2)  Call MD if unable to obtain prior to antibiotics being given     Status: None (Preliminary result)   Collection Time: 07/28/14  8:00 PM  Result Value Ref Range Status   Specimen Description BLOOD RIGHT ARM  Final   Special Requests AEB 5CC  Final   Culture   Final           BLOOD CULTURE RECEIVED NO GROWTH TO DATE CULTURE WILL BE HELD FOR 5 DAYS BEFORE ISSUING A FINAL NEGATIVE REPORT Performed at [facility]    Report Status PENDING  Incomplete  MRSA PCR Screening     Status: None   Collection Time: 07/28/14 10:26 PM  Result Value Ref Range Status   MRSA by PCR NEGATIVE NEGATIVE Final    Comment:        The GeneXpert MRSA Assay (FDA approved for NASAL specimens only), is one component of a comprehensive MRSA colonization surveillance program. It is not  intended to diagnose MRSA infection nor to guide or monitor treatment for MRSA infections.   Culture, respiratory (NON-Expectorated)     Status: None (Preliminary result)   Collection Time: 07/29/14  1:22 AM  Result Value Ref Range Status   Specimen Description SPU  Final   Special Requests NONE  Final   Gram Stain   Final    MODERATE WBC PRESENT,BOTH PMN AND MONONUCLEAR RARE SQUAMOUS EPITHELIAL CELLS PRESENT FEW GRAM POSITIVE COCCI IN PAIRS IN CHAINS RARE GRAM POSITIVE RODS RARE GRAM NEGATIVE RODS    Culture   Final    Culture reincubated for better growth Performed at [facility]    Report Status PENDING  Incomplete     Scheduled Meds: . antiseptic oral rinse  7 mL Mouth Rinse BID  . azithromycin  500 mg Intravenous Q24H  . cefTRIAXone (ROCEPHIN)  IV  1 g Intravenous Q24H  . enoxaparin (LOVENOX) injection  30 mg Subcutaneous Q24H  . feeding supplement (ENSURE ENLIVE)  237 mL Oral TID BM  . guaiFENesin  600 mg Oral BID  . insulin aspart  0-9 Units Subcutaneous TID WC  . ipratropium-albuterol  3 mL Nebulization Q4H  . methylPREDNISolone (SOLU-MEDROL) injection  60 mg Intravenous Q6H  .  multivitamin with minerals  1 tablet Oral Daily  . pantoprazole  40 mg Oral Daily   Continuous Infusions:      

## 2014-07-31 NOTE — Progress Notes (Signed)
Patient received from ICU. Agree with assessment. VSS. Will continue to monitor.

## 2014-07-31 NOTE — Evaluation (Signed)
Occupational Therapy Evaluation Patient Details Name: [name] MRN: [id number] DOB: 04-24-1971 Today's Date: 07/31/2014    History of Present Illness Pt admitted with acute respiratory failure due to CAP and COPD exacerbation. 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   Clinical Impression   Pt is progressing, but fatigues rapidly with 02 sats decreasing to low to mid 80s with activity.  Pt required 3L 02 and ~4-5 mins to recover into the low 90s.  Attempts at reducing 02 back to 2L were not successful with sats decreasing to mid 80s.  RN notified.  Encouraged IS use. Will follow.     Follow Up Recommendations  Home health OT    Equipment Recommendations  None recommended by OT    Recommendations for Other Services       Precautions / Restrictions Precautions Precautions: Fall Precaution Comments: watch sats      Mobility Bed Mobility Overal bed mobility: Needs Assistance Bed Mobility: Supine to Sit;Sit to Supine     Supine to sit: Supervision Sit to supine: Supervision      Transfers Overall transfer level: Needs assistance Equipment used: Rolling walker (2 wheeled) Transfers: Sit to/from [employer] Sit to Stand: Min guard Stand pivot transfers: Min guard       General transfer comment: min guard for balance     Balance Overall balance assessment: Needs assistance         Standing balance support: During functional activity Standing balance-Leahy Scale: Poor                              ADL       Grooming: Wash/dry hands;Oral care;Min guard;Standing               Lower Body Dressing: Minimal assistance   Toilet Transfer: Ambulation   Toileting- Clothing Manipulation and Hygiene: Sit to/from stand;Minimal assistance         General ADL Comments: Pt requires asssist for balance.  Her 02 saturations decrease into the low 80s with activity.  She required 3L 02 to rebound to low 90s.  Attempts to  decrease 02 back to 2L were unsuccessful with 02 sats falling into mid 80s.  Encouraged IS and RN notified      Vision     Perception     Praxis      Pertinent Vitals/Pain Pain Assessment: No/denies pain     Hand Dominance     Extremity/Trunk Assessment             Communication     Cognition Arousal/Alertness: Awake/alert Behavior During Therapy: Impulsive Overall Cognitive Status: Within Functional Limits for tasks assessed                     General Comments       Exercises       Shoulder Instructions      Home Living                                          Prior Functioning/Environment               OT Diagnosis:     OT Problem List:     OT Treatment/Interventions:      OT Goals(Current goals can be found in the care plan section) ADL Goals  Pt Will Perform Grooming: with supervision;standing Pt Will Perform Lower Body Bathing: with supervision;sit to/from stand Pt Will Perform Lower Body Dressing: with supervision;sit to/from stand Pt Will Transfer to Toilet: with supervision;ambulating;regular height toilet Pt Will Perform Toileting - Clothing Manipulation and hygiene: with supervision;sit to/from stand Additional ADL Goal #1: Pt will utilize energy conservation strategies in mobility and ADL with min verbal cues. Additional ADL Goal #2: Pt will be knowledgeable in use/benefits of tub equipment.  OT Frequency: Min 2X/week   Barriers to D/C:            Co-evaluation              End of Session Equipment Utilized During Treatment: Rolling walker;Oxygen Nurse Communication: Mobility status;Other (comment) (02 sats )  Activity Tolerance: Treatment limited secondary to medical complications (Comment) (decreased 02 sats ) Patient left: in CPM;with call bell/phone within reach;with chair alarm set;with nursing/sitter in room   Time: 1141-1220 OT Time Calculation (min): 39 min Charges:  OT General  Charges $OT Visit: 1 Procedure OT Evaluation $Initial OT Evaluation Tier I: 1 Procedure OT Treatments $Self Care/Home Management : 23-37 mins $Therapeutic Activity: 8-22 mins G-Codes:    Padraic Marinos M 07/31/2014, 12:27 PM

## 2014-07-31 NOTE — Progress Notes (Signed)
Pt currently on 2LPM [place]. No distress noted at this time.

## 2014-08-01 ENCOUNTER — Inpatient Hospital Stay (HOSPITAL_COMMUNITY): Payer: Medicaid Other

## 2014-08-01 LAB — BASIC METABOLIC PANEL
Anion gap: 5 (ref 5–15)
BUN: 7 mg/dL (ref 6–23)
CALCIUM: 8.4 mg/dL (ref 8.4–10.5)
CO2: 39 mmol/L — ABNORMAL HIGH (ref 19–32)
Chloride: 94 mmol/L — ABNORMAL LOW (ref 96–112)
Creatinine, Ser: <0.3 mg/dL — ABNORMAL LOW (ref 0.50–1.10)
Glucose, Bld: 183 mg/dL — ABNORMAL HIGH (ref 70–99)
Potassium: 4.8 mmol/L (ref 3.5–5.1)
SODIUM: 138 mmol/L (ref 135–145)

## 2014-08-01 LAB — CBC
HCT: 42.2 % (ref 36.0–46.0)
Hemoglobin: 13.2 g/dL (ref 12.0–15.0)
MCH: 33.2 pg (ref 26.0–34.0)
MCHC: 31.3 g/dL (ref 30.0–36.0)
MCV: 106.3 fL — ABNORMAL HIGH (ref 78.0–100.0)
PLATELETS: 262 10*3/uL (ref 150–400)
RBC: 3.97 MIL/uL (ref 3.87–5.11)
RDW: 16 % — AB (ref 11.5–15.5)
WBC: 5.4 10*3/uL (ref 4.0–10.5)

## 2014-08-01 LAB — TROPONIN I

## 2014-08-01 LAB — GLUCOSE, CAPILLARY
GLUCOSE-CAPILLARY: 111 mg/dL — AB (ref 70–99)
GLUCOSE-CAPILLARY: 260 mg/dL — AB (ref 70–99)
Glucose-Capillary: 159 mg/dL — ABNORMAL HIGH (ref 70–99)
Glucose-Capillary: 212 mg/dL — ABNORMAL HIGH (ref 70–99)

## 2014-08-01 LAB — BRAIN NATRIURETIC PEPTIDE: B NATRIURETIC PEPTIDE 5: 161.8 pg/mL — AB (ref 0.0–100.0)

## 2014-08-01 MED ORDER — PNEUMOCOCCAL VAC POLYVALENT 25 MCG/0.5ML IJ INJ
0.5000 mL | INJECTION | INTRAMUSCULAR | Status: AC
  Administered 2014-08-02: 0.5 mL via INTRAMUSCULAR
  Filled 2014-08-01 (×2): qty 0.5

## 2014-08-01 NOTE — Progress Notes (Addendum)
Patient ID: [name], female   DOB: [birth year], 44 y.o.   MRN: [id number] [facility] PROGRESS NOTE  [name] RDE:[id number] DOB: Dec 02, 1970 DOA: 07/28/2014 PCP: Philis Fendt, MD  Brief narrative:    44 y.o. female with a PMH of Friedrich's ataxia/wheelchair bound, end stage COPD, chronic respiratory failure on 2 L home oxygen who presented with a 2-3 day history of worsening dyspnea, associated fevers, chills and cough productive of thick green sputum. She also had associated nausea and vomiting for past few days prior to this admission.  Patient was in more respiratory distress the night of 07/28/2014. She has required a Ventimask for oxygen support. She stayed in step down unit because of hypotension which has improved with IV fluids. Her respiratory status improved however repeat chest x-ray August 25, 2014 shows increased severity of left lower lobe consolidation and developing right lower lobe infiltrates in addition to diffuse interstitial opacity bilaterally seem to be increased compared to prior studies. Appreciate pulmonary consult and recommendations.   Assessment/Plan:    Principal Problem: Acute on chronic respiratory failure with hypoxia secondary to multilobar pneumonia and COPD exacerbation - Hypoxia secondary to combination of end-stage COPD and ongoing pneumonia. Chest x-ray on admission showed pneumonia in the lingular and left lower lobe. -  Patient required Ventimask initially to keep oxygen saturations above 90%. She was in step down unit, transferred to telemetry Aug 25, 2014. -  Her respiratory status is stable however repeat chest x-ray Aug 25, 2014 shows increased severity of left lower lobe consolidation, developing right lower lobe infiltrate and diffuse interstitial opacity bilaterally seem to be increased compared to prior studies. -  Patient was on azithromycin and Rocephin since the admission. -  Blood cultures to date are negative. Respiratory  culture was re-incubated for better growth. HIV is nonreactive, strep pneumonia, influenza and Legionella are all negative. -  She is on steroids, Solu-Medrol 60 mg IV every 12 hours. Will continue current regimen. -  Continue current nebulizers, DuoNeb every 4 hours scheduled and every 4 hours as needed for shortness of breath or wheezing -  Appreciate pulmonary consult and recommendations   Active Problems: Sepsis secondary to lobar pneumonia - Sepsis criteria met on admission with VS significant for hypothermia (96.59F), HR 126, RR 24, BP 81/60. In addition patient severely hypoxic with oxygen saturation as low as 75% requiring Ventimask for oxygen support. Source of infection pneumonia which was seen on chest x-ray done on the admission. Of note, lactic acid was elevated as well. Procalcitonin WNL. Lactic acid subsequently normalized. -  Continue azithromycin and Rocephin. -  Blood cultures to date are negative.   Friedreich's ataxia - Wheelchair bound.     Hypocalcemia -  Calcium being supplemented with vitamin D   Hypokalemia - Secondary to nebulizer treatments. Supplemented and then elevated at 5.3. - Repeat potassium within normal limits.    Severe protein calorie malnutrition - Nutrition consulted    DVT prophylaxis - Lovenox ordered while patient is in hospital.     GI prophylaxis -  Patient on Protonix since she is on steroids.   Code Status: Full.  Family Communication:  plan of care discussed with the patient Disposition Plan:  Not stable for discharge at this time. Her chest x-ray shows worsening pneumonia.    IV access:  Peripheral IV  Procedures and diagnostic studies:    Dg Chest Port 1 View 08/25/14   1. Increased severity of left lower lobe consolidation consistent with pneumonia. 2. Developing right lower lobe infiltrate  possibly also pneumonia. 3. Diffuse interstitial opacity bilaterally, similar to 07/29/2014 but increased from 07/28/2014 and  04/11/2014. No vascular congestion to suggest this represents cardiogenic pulmonary edema. This may represent atypical pneumonia. 4. Increased bilateral pleural effusions.  Dg Chest Port 1 View 07/29/2014   Regressed left lung base pneumonia. Chronic lung disease with hyperinflation.    Dg Chest Port 1 View 07/28/2014  Increased lingula and left lower lobe streaky bronchovascular airspace process concerning for pneumonia.  Hyperinflation.    Medical Consultants:  Pulmonary  Other Consultants:  Nutrition Physical therapy Occupational therapy  IAnti-Infectives:   Azithromycin 07/28/2014 --> Rocephin 07/28/2014 -->    Leisa Lenz, MD  [facility] Pager [phone]  If 7PM-7AM, please contact night-coverage [email or web address] Password [email or web address] 08/01/2014, 9:48 AM   LOS: 4 days    HPI/Subjective: No acute overnight events.  Objective: Filed Vitals:   07/31/14 2045 08/01/14 0542 08/01/14 0831 08/01/14 0919  BP: 102/61 108/67  102/62  Pulse: 74 56  75  Temp: 98 F (36.7 C) 98.1 F (36.7 C)    TempSrc: Oral Oral    Resp: 16 15    Height:      Weight:      SpO2: 94% 99% 88% 100%    Intake/Output Summary (Last 24 hours) at 08/01/14 0948 Last data filed at 08/01/14 0851  Gross per 24 hour  Intake   1080 ml  Output   2400 ml  Net  -1320 ml    Exam:   General:  Pt is alert, no distress  Cardiovascular:  Rate control, appreciate S1, S2  Respiratory:  Still appreciated wheezing in upper lung lobes, rhonchi.  Abdomen:  nontender abdomen, non-distended, appreciate bowel sounds  Extremities:  No lower extremity edema , palpable pulses  Neuro:  No focal deficits  Data Reviewed: Basic Metabolic Panel:  Recent Labs Lab 07/28/14 1420 07/29/14 0418 07/30/14 0150 08/01/14 0330  NA 141 141 137 138  K 3.0* 5.3* 5.0 4.8  CL 86* 95* 99 94*  CO2  --  38* 34* 39*  GLUCOSE 138* 144* 178* 183*  BUN 5* 7 7 7   CREATININE 0.50 0.35* <0.30* <0.30*  CALCIUM  --  7.9* 7.3* 8.4    Liver Function Tests: No results for input(s): AST, ALT, ALKPHOS, BILITOT, PROT, ALBUMIN in the last 168 hours. No results for input(s): LIPASE, AMYLASE in the last 168 hours. No results for input(s): AMMONIA in the last 168 hours. CBC:  Recent Labs Lab 07/28/14 1413 07/28/14 1420 08/01/14 0330  WBC 6.2  --  5.4  NEUTROABS 4.9  --   --   HGB 15.9* 18.7* 13.2  HCT 50.7* 55.0* 42.2  MCV 107.9*  --  106.3*  PLT 235  --  262   Cardiac Enzymes: No results for input(s): CKTOTAL, CKMB, CKMBINDEX, TROPONINI in the last 168 hours. BNP: Invalid input(s): POCBNP CBG:  Recent Labs Lab 07/31/14 0841 07/31/14 1145 07/31/14 1709 07/31/14 2044 08/01/14 0801  GLUCAP 143* 179* 164* 143* 111*    Culture, blood (routine x 2) Call MD if unable to obtain prior to antibiotics being given     Status: None (Preliminary result)   Collection Time: 07/28/14  7:55 PM  Result Value Ref Range Status   Specimen Description BLOOD RIGHT WRIST  Final   Special Requests AEB 2CC  Final   Culture   Final           BLOOD CULTURE RECEIVED NO GROWTH TO DATE  Performed at [facility]    Report Status PENDING  Incomplete  Culture, blood (routine x 2) Call MD if unable to obtain prior to antibiotics being given     Status: None (Preliminary result)   Collection Time: 07/28/14  8:00 PM  Result Value Ref Range Status   Specimen Description BLOOD RIGHT ARM  Final   Special Requests AEB 5CC  Final   Culture   Final           BLOOD CULTURE RECEIVED NO GROWTH TO DATE     Report Status PENDING  Incomplete  MRSA PCR Screening     Status: None   Collection Time: 07/28/14 10:26 PM  Result Value Ref Range Status   MRSA by PCR NEGATIVE NEGATIVE Final  Culture, respiratory (NON-Expectorated)     Status: None   Collection Time: 07/29/14  1:22 AM  Result Value Ref Range Status   Specimen Description SPU  Final   Special Requests NONE  Final   Gram Stain   Final   Culture   Final    NORMAL  OROPHARYNGEAL FLORA Performed at [facility]    Report Status 07/31/2014 FINAL  Final     Scheduled Meds: . antiseptic oral rinse  7 mL Mouth Rinse BID  . azithromycin  500 mg Intravenous Q24H  . calcium-vitamin D  1 tablet Oral BID  . cefTRIAXone (ROCEPHIN)  IV  1 g Intravenous Q24H  . enoxaparin (LOVENOX) injection  30 mg Subcutaneous Q24H  . feeding supplement (ENSURE ENLIVE)  237 mL Oral TID BM  . guaiFENesin  600 mg Oral BID  . insulin aspart  0-9 Units Subcutaneous TID WC  . ipratropium-albuterol  3 mL Nebulization QID  . methylPREDNISolone (SOLU-MEDROL) injection  60 mg Intravenous Q12H  . multivitamin with minerals  1 tablet Oral Daily  . pantoprazole  40 mg Oral Daily  . [START ON 08/02/2014] pneumococcal 23 valent vaccine  0.5 mL Intramuscular Tomorrow-1000

## 2014-08-01 NOTE — Progress Notes (Signed)
Occupational Therapy Treatment Patient Details Name: [name] MRN: [id number] DOB: May 30, 1970 Today's Date: 08/01/2014    History of present illness Pt admitted with acute respiratory failure due to CAP and COPD exacerbation. 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   OT comments  Pt limited today by fatigue and decrased 02 sats with activity.  Sats decreased to 77% on 4L when ambulating to BR.   They very slowly recovered to 90% on 5L.  Pt left in bed with sats 90% on 3L.   She reports she used w/c due to respiratory status not due to the ataxia   Follow Up Recommendations  Home health OT    Equipment Recommendations  None recommended by OT    Recommendations for Other Services      Precautions / Restrictions Precautions Precautions: Fall Precaution Comments: watch sats       Mobility Bed Mobility Overal bed mobility: Needs Assistance Bed Mobility: Supine to Sit;Sit to Supine     Supine to sit: Supervision Sit to supine: Supervision   General bed mobility comments: supervision for O2 line  Transfers Overall transfer level: Needs assistance Equipment used: Rolling walker (2 wheeled) Transfers: Sit to/from UGI CorporationStand;Stand Pivot Transfers Sit to Stand: Min guard         General transfer comment: Pt requires min guard assist for balance     Balance           Standing balance support: During functional activity Standing balance-Leahy Scale: Poor                     ADL                           Toilet Transfer: Min guard;Ambulation;Comfort height toilet;Grab bars;RW   Toileting- ArchitectClothing Manipulation and Hygiene: Min guard;Sit to/from stand         General ADL Comments: Pt fatigues quickly with activity.  02 sats on 4L decreased to 77%.  It required ~10 mins for to return to 90% on 5L 02.  Pt left on 3L with sats in the low 90s.  HR 113 with actvity and 94 at rest       Vision                     Perception     Praxis      Cognition   Behavior During Therapy: [facility]FL for tasks assessed/performed Overall Cognitive Status: Within Functional Limits for tasks assessed                       Extremity/Trunk Assessment               Exercises     Shoulder Instructions       General Comments      Pertinent Vitals/ Pain       Pain Assessment: No/denies pain  Home Living                                          Prior Functioning/Environment              Frequency Min 2X/week     Progress Toward Goals  OT Goals(current goals can now be found in the care plan section)     ADL Goals Pt Will Perform Grooming:  with supervision;standing Pt Will Perform Lower Body Bathing: with supervision;sit to/from stand Pt Will Perform Lower Body Dressing: with supervision;sit to/from stand Pt Will Transfer to Toilet: with supervision;ambulating;regular height toilet Pt Will Perform Toileting - Clothing Manipulation and hygiene: with supervision;sit to/from stand Additional ADL Goal #1: Pt will utilize energy conservation strategies in mobility and ADL with min verbal cues. Additional ADL Goal #2: Pt will be knowledgeable in use/benefits of tub equipment.  Plan Discharge plan remains appropriate    Co-evaluation                 End of Session Equipment Utilized During Treatment: Rolling walker;Oxygen   Activity Tolerance Patient limited by fatigue   Patient Left in bed;with call bell/phone within reach;with bed alarm set   Nurse Communication Mobility status;Other (comment) (Pt with mild edema at IV site )        Time: 1610-9604 OT Time Calculation (min): 27 min  Charges: OT General Charges $OT Visit: 1 Procedure OT Treatments $Self Care/Home Management : 8-22 mins $Therapeutic Activity: 8-22 mins  Starlette Thurow M 08/01/2014, 4:24 PM

## 2014-08-01 NOTE — Consult Note (Signed)
PULMONARY / CRITICAL CARE MEDICINE   Name: [name] MRN: [id number] DOB: 07/01/1970    ADMISSION DATE:  07/28/2014 CONSULTATION DATE:  08/01/14 REFERRING MD :  Dr [facility]on [facility]sey [facility]  CHIEF COMPLAINT:  Acute on chronic resp failure - in setting of Freidrich Ataxia, COPD and AECOPD and failure to improve since admission    HISTORY OF PRESENT ILLNESS: 44 y.o. female with a PMH of Friedrich's ataxia/wheelchair bound, end stage COPD, chronic respiratory failure on 2 L home oxygen who presented with a 2-3 day history of worsening dyspnea, associated fevers, chills and cough productive of thick green sputum. She also had associated nausea and vomiting for past few days prior to this admission. Patient was in more respiratory distress the night of 07/28/2014. She has required a Ventimask for oxygen support. She stayed in step down unit because of hypotension which  mproved with IV fluids. She was flu PCR neative but Rx for AECOPD with Abx (HIV negative, urine leg, urine strep negative) and steroids. There was concern for sepsis syndrome due to SIRS with ypothermia but PCT/lactate normal at admit. She was moved to floor 3./07/01/14 due to improvement. On 08/01/14 she was noticed to be at 2L o2 basal at rest but with exertion desaturating despite 4L o2 and failing to correct. So, PCCM consuted 4./1/16.She herself feels better. Main finding is change in CXR 07/31/14 showing development of bilateral new plerual effusion - during admission (clear baseline a  Year ago)  PAST MEDICAL HISTORY :   has a past medical history of Arthritis; COPD (chronic obstructive pulmonary disease); Respiratory failure, acute; Diabetes mellitus; toe surgery; Ankle fracture; On supplemental oxygen therapy; Friedreich's ataxia; GERD (gastroesophageal reflux disease); and Asthma.  has past surgical history that includes Cholecystectomy; Knee arthroscopy; Toe Surgery; ORIF ankle fracture (04/23/2012); Hardware Removal (Left,  06/16/2012); Incision and drainage (Left, 06/16/2012); and Application if wound vac (Left, 06/16/2012). Prior to Admission medications   Medication Sig Start Date End Date Taking? Authorizing Provider  ipratropium (ATROVENT) 0.02 % nebulizer solution Take 2.5 mLs (0.5 mg total) by nebulization 3 (three) times daily. Dx 496 Patient taking differently: Take 0.5 mg by nebulization every 6 (six) hours.  07/09/13  Yes Oretha Milch, MD   No Known Allergies  FAMILY HISTORY:  indicated that her mother is alive. She indicated that her father is deceased. She indicated that both of her sisters are alive. She indicated that her brother is alive. She indicated that her maternal grandmother is deceased. She indicated that her maternal grandfather is deceased. She indicated that her paternal grandmother is deceased. She indicated that her paternal grandfather is deceased.  SOCIAL HISTORY:  reports that she quit smoking about 4 years ago. Her smoking use included Cigarettes. She has a 5 pack-year smoking history. She has never used smokeless tobacco. She reports that she does not drink alcohol or use illicit drugs.  REVIEW OF SYSTEMS:  Poor historian and this is unelicitable    VITAL SIGNS: Temp:  [97.7 F (36.5 C)-98.1 F (36.7 C)] 98.1 F (36.7 C) (04/01 0542) Pulse Rate:  [56-75] 75 (04/01 0919) Resp:  [14-16] 15 (04/01 0542) BP: (102-110)/(61-78) 102/62 mmHg (04/01 0919) SpO2:  [88 %-100 %] 99 % (04/01 1126) HEMODYNAMICS:      INTAKE / OUTPUT:  Intake/Output Summary (Last 24 hours) at 08/01/14 1258 Last data filed at 08/01/14 1030  Gross per 24 hour  Intake   1320 ml  Output   2350 ml  Net  -1030 ml  PHYSICAL EXAMINATION: General:  Frail, cachectic female in contractures, sitting in bed Psych: poor historian Neuro:  Alert and oriented x 3.Apepars weak neuromuscularly HEENT:  Supple neck. No neck nodes Cardiovascular:  Normal heart sounds Lungs:  CTA bilaterally. Overall diminished  air entry Abdomen:  Soft, no mass Musculoskeletal:  No cyanosis, no clubbing, no edema Skin:  Intact anteriorly  LABS: PULMONARY  Recent Labs Lab 07/28/14 1420 07/28/14 1625  PHART  --  7.417  PCO2ART  --  68.3*  PO2ART  --  41.2*  HCO3  --  43.2*  TCO2 43 37.3  O2SAT  --  75.8    CBC  Recent Labs Lab 07/28/14 1413 07/28/14 1420 08/01/14 0330  HGB 15.9* 18.7* 13.2  HCT 50.7* 55.0* 42.2  WBC 6.2  --  5.4  PLT 235  --  262    COAGULATION No results for input(s): INR in the last 168 hours.  CARDIAC  No results for input(s): TROPONINI in the last 168 hours. No results for input(s): PROBNP in the last 168 hours.   CHEMISTRY  Recent Labs Lab 07/28/14 1420 07/29/14 0418 07/30/14 0150 08/01/14 0330  NA 141 141 137 138  K 3.0* 5.3* 5.0 4.8  CL 86* 95* 99 94*  CO2  --  38* 34* 39*  GLUCOSE 138* 144* 178* 183*  BUN 5* CREATININE 0.50 0.35* <0.30* <0.30*  CALCIUM  --  7.9* 7.3* 8.4   CrCl cannot be calculated (Patient has no serum creatinine result on file.).   LIVER No results for input(s): AST, ALT, ALKPHOS, BILITOT, PROT, ALBUMIN, INR in the last 168 hours.   INFECTIOUS  Recent Labs Lab 07/29/14 1004  07/29/14 1953 07/29/14 2230 07/30/14 0150  LATICACIDVEN 2.6*  < > 2.4* 2.5* 1.3  PROCALCITON <0.10  --   --   --   --   < > = values in this interval not displayed.   ENDOCRINE CBG (last 3)   Recent Labs  07/31/14 1709 07/31/14 2044 08/01/14 0801  GLUCAP 164* 143* 111*         IMAGING x48h Dg Chest Port 1 View   07/31/2014   CLINICAL DATA:  Subsequent evaluation shortness of breath, pneumonia, COPD  EXAM: PORTABLE CHEST - 1 VIEW  COMPARISON:  07/29/2014  FINDINGS: Heart size remains normal. Moderate interstitial prominence again identified diffusely bilaterally. Interval development or increase in bilateral pleural effusions, small on the right and small to moderate on the left. Increased consolidation with air bronchograms  throughout the left lower lobe. New hazy opacity right lower lobe.  No evidence of vascular congestion.  IMPRESSION: 1. Increased severity of left lower lobe consolidation consistent with pneumonia. 2. Developing right lower lobe infiltrate possibly also pneumonia. 3. Diffuse interstitial opacity bilaterally, similar to 07/29/2014 but increased from 07/28/2014 and 04/11/2014. No vascular congestion to suggest this represents cardiogenic pulmonary edema. This may represent atypical pneumonia. 4. Increased bilateral pleural effusions.   Electronically Signed   By: Esperanza Heir M.D.   On: 07/31/2014 08:55    Anti-infectives    Start     Dose/Rate Route Frequency Ordered Stop   07/29/14 1400  cefTRIAXone (ROCEPHIN) 1 g in dextrose 5 % 50 mL IVPB - Premix     1 g 100 mL/hr over 30 Minutes Intravenous Every 24 hours 07/28/14 1848 08/05/14 1359   07/29/14 1400  azithromycin (ZITHROMAX) 500 mg in dextrose 5 % 250 mL IVPB     500 mg 250 mL/hr  over 60 Minutes Intravenous Every 24 hours 07/28/14 1848 08/05/14 1359   07/28/14 1445  levofloxacin (LEVAQUIN) IVPB 500 mg     500 mg 100 mL/hr over 60 Minutes Intravenous  Once 07/28/14 1441 07/28/14 1557      Results for orders placed or performed during the hospital encounter of 07/28/14  Culture, blood (routine x 2) Call MD if unable to obtain prior to antibiotics being given     Status: None (Preliminary result)   Collection Time: 07/28/14  7:55 PM  Result Value Ref Range Status   Specimen Description BLOOD RIGHT WRIST  Final   Special Requests AEB 2CC  Final   Culture   Final           BLOOD CULTURE RECEIVED NO GROWTH TO DATE CULTURE WILL BE HELD FOR 5 DAYS BEFORE ISSUING A FINAL NEGATIVE REPORT Note: Culture results may be compromised due to an inadequate volume of blood received in culture bottles. [facility]    Report Status PENDING  Incomplete  Culture, blood (routine x 2) Call MD if unable to obtain prior to antibiotics  being given     Status: None (Preliminary result)   Collection Time: 07/28/14  8:00 PM  Result Value Ref Range Status   Specimen Description BLOOD RIGHT ARM  Final   Special Requests AEB 5CC  Final   Culture   Final           BLOOD CULTURE RECEIVED NO GROWTH TO DATE CULTURE WILL BE HELD FOR 5 DAYS BEFORE ISSUING A FINAL NEGATIVE REPORT [facility] at [facility]    Report Status PENDING  Incomplete  MRSA PCR Screening     Status: None   Collection Time: 07/28/14 10:26 PM  Result Value Ref Range Status   MRSA by PCR NEGATIVE NEGATIVE Final    Comment:        The GeneXpert MRSA Assay (FDA approved for NASAL specimens only), is one component of a comprehensive MRSA colonization surveillance program. It is not intended to diagnose MRSA infection nor to guide or monitor treatment for MRSA infections.   Culture, respiratory (NON-Expectorated)     Status: None   Collection Time: 07/29/14  1:22 AM  Result Value Ref Range Status   Specimen Description SPU  Final   Special Requests NONE  Final   Gram Stain   Final    MODERATE WBC PRESENT,BOTH PMN AND MONONUCLEAR RARE SQUAMOUS EPITHELIAL CELLS PRESENT FEW GRAM POSITIVE COCCI IN PAIRS IN CHAINS RARE GRAM POSITIVE RODS RARE GRAM NEGATIVE RODS    Culture   Final    NORMAL OROPHARYNGEAL FLORA Performed at [facility]    Report Status 07/31/2014 FINAL  Final       ASSESSMENT / PLAN:  PULMONARY  A: #BAseline   - Chronic resp failure due to COPD NOS needing 2L in setting of prior smoking and Friedrich ATaxia  #Current  - Acute COPD exacerbation at admit with possible Pneumonia 07/28/2014 -  Slow improvement 08/01/2014 per [facility] despite optimal Rx for AECOPD and PCCM consult  - development of pleural effusion new 07/31/14 concerns for heart failure nos   P:   Cntinue AECOPD rx /CAP Rx with abx, steroids, nebs Investigate pleural effusions  - CT chest  - echo  - bnp  - troponin x 3   PCCM will follow ove  weekend  Dr. Kalman ShanMurali Raylei Losurdo, M.D., [facility].C.P Pulmonary and Critical Care Medicine Staff Physician [facility]  [facility] Pager: [phone], If no answer or between  15:00h - 7:00h: call [phone]  08/01/2014 1:07 PM

## 2014-08-01 NOTE — Progress Notes (Signed)
Physical Therapy Treatment Patient Details Name: Quillian Quinceheresa L Salido MRN: [id number] DOB: 08-Mar-1971 Today's Date: 08/01/2014    History of Present Illness Pt admitted with acute respiratory failure due to CAP and COPD exacerbation. 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    PT Comments    Pt assisted with short distance ambulation in hallway, mobility appears improved however still requiring increased oxygen demand with exertion.  Pt required increase to 4L during ambulation (SpO2 down to 83%) however once back in room at rest SpO2 91% on 2L O2.    Follow Up Recommendations  Home health PT;Supervision/Assistance - 24 hour (if possible)     Equipment Recommendations  None recommended by PT    Recommendations for Other Services       Precautions / Restrictions Precautions Precautions: Fall Precaution Comments: watch sats    Mobility  Bed Mobility Overal bed mobility: Needs Assistance Bed Mobility: Supine to Sit;Sit to Supine     Supine to sit: Supervision Sit to supine: Supervision   General bed mobility comments: supervision for O2 line  Transfers Overall transfer level: Needs assistance Equipment used: Rolling walker (2 wheeled) Transfers: Sit to/from Stand Sit to Stand: Min guard         General transfer comment: close guard for safety  Ambulation/Gait Ambulation/Gait assistance: Min guard Ambulation Distance (Feet): 40 Feet Assistive device: Rolling walker (2 wheeled) Gait Pattern/deviations: Step-through pattern;Decreased stride length;Ataxic;Wide base of support     General Gait Details: slower gait today, cues for rest breaks for breathing, increased oxygen to 4L O2 [place] during ambulation and SpO2 down to 83%.    Stairs            Wheelchair Mobility    Modified Rankin (Stroke Patients Only)       Balance                                    Cognition Arousal/Alertness: Awake/alert Behavior During Therapy: WFL for  tasks assessed/performed Overall Cognitive Status: Within Functional Limits for tasks assessed                      Exercises      General Comments        Pertinent Vitals/Pain Pain Assessment: No/denies pain    Home Living                      Prior Function            PT Goals (current goals can now be found in the care plan section) Progress towards PT goals: Progressing toward goals    Frequency  Min 3X/week    PT Plan Current plan remains appropriate    Co-evaluation             End of Session Equipment Utilized During Treatment: Oxygen Activity Tolerance: Patient tolerated treatment well Patient left: with call bell/phone within reach;with family/visitor present;in bed     Time: [id number] PT Time Calculation (min) (ACUTE ONLY): 19 min  Charges:  $Gait Training: 8-22 mins                    G Codes:      Caprice Wasko,KATHrine E 08/01/2014, 1:41 PM Zenovia JarredKati Draeden Kellman, PT, DPT 08/01/2014 Pager: [phone]

## 2014-08-02 DIAGNOSIS — E43 Unspecified severe protein-calorie malnutrition: Secondary | ICD-10-CM

## 2014-08-02 DIAGNOSIS — I509 Heart failure, unspecified: Secondary | ICD-10-CM

## 2014-08-02 DIAGNOSIS — J90 Pleural effusion, not elsewhere classified: Secondary | ICD-10-CM

## 2014-08-02 LAB — TROPONIN I
Troponin I: <0.03 ng/mL (ref <0.031)
Troponin I: <0.03 ng/mL (ref <0.031)

## 2014-08-02 LAB — GLUCOSE, CAPILLARY
GLUCOSE-CAPILLARY: 168 mg/dL — AB (ref 70–99)
GLUCOSE-CAPILLARY: 216 mg/dL — AB (ref 70–99)
Glucose-Capillary: 136 mg/dL — ABNORMAL HIGH (ref 70–99)

## 2014-08-02 MED ORDER — METHYLPREDNISOLONE SODIUM SUCC 125 MG IJ SOLR
60.0000 mg | INTRAMUSCULAR | Status: DC
  Administered 2014-08-03: 60 mg via INTRAVENOUS
  Filled 2014-08-02: qty 2

## 2014-08-02 NOTE — Consult Note (Signed)
PULMONARY / CRITICAL CARE MEDICINE   Name: [name] MRN: [id number] DOB: 09/19/1970    ADMISSION DATE:  07/28/2014 CONSULTATION DATE:  08/01/14 REFERRING MD :  Dr Manson Passey [facility]  CHIEF COMPLAINT:  Acute on chronic resp failure - in setting of Freidrich Ataxia, COPD and AECOPD and failure to improve since admission  Subjective: Thinks breathing better. Not much phlegm. RT here to give neb   VITAL SIGNS: Temp:  [98.1 F (36.7 C)-98.6 F (37 C)] 98.5 F (36.9 C) (04/02 0537) Pulse Rate:  [66-94] 66 (04/02 0537) Resp:  [15-16] 16 (04/02 0537) BP: (99-108)/(56-66) 108/66 mmHg (04/02 0537) SpO2:  [90 %-95 %] 93 % (04/02 1008) HEMODYNAMICS:     INTAKE / OUTPUT:  Intake/Output Summary (Last 24 hours) at 08/02/14 1332 Last data filed at 08/02/14 1103  Gross per 24 hour  Intake   1920 ml  Output   1885 ml  Net     35 ml    PHYSICAL EXAMINATION: General:  Frail, cachectic female in contractures, sitting in bed, feeding self Psych: poor historian Neuro:  Alert and oriented x 3.Appears weak  HEENT:  Supple neck. No neck nodes Cardiovascular:  Normal heart sounds Lungs:  CTA bilaterally. Overall diminished air entry. Dull in bases, no rub Abdomen:  Soft, no mass Musculoskeletal:  No cyanosis, no clubbing, no edema Skin:  Intact anteriorly  LABS: PULMONARY  Recent Labs Lab 07/28/14 1420 07/28/14 1625  PHART  --  7.417  PCO2ART  --  68.3*  PO2ART  --  41.2*  HCO3  --  43.2*  TCO2 43 37.3  O2SAT  --  75.8    CBC  Recent Labs Lab 07/28/14 1413 07/28/14 1420 08/01/14 0330  HGB 15.9* 18.7* 13.2  HCT 50.7* 55.0* 42.2  WBC 6.2  --  5.4  PLT 235  --  262    COAGULATION No results for input(s): INR in the last 168 hours.  CARDIAC    Recent Labs Lab 08/01/14 1425 08/01/14 2120 08/02/14 0522  TROPONINI <0.03 <0.03 <0.03   No results for input(s): PROBNP in the last 168 hours.   CHEMISTRY  Recent Labs Lab 07/28/14 1420 07/29/14 0418  07/30/14 0150 08/01/14 0330  NA 141 141 137 138  K 3.0* 5.3* 5.0 4.8  CL 86* 95* 99 94*  CO2  --  38* 34* 39*  GLUCOSE 138* 144* 178* 183*  BUN 5* CREATININE 0.50 0.35* <0.30* <0.30*  CALCIUM  --  7.9* 7.3* 8.4   CrCl cannot be calculated (Patient has no serum creatinine result on file.).   LIVER No results for input(s): AST, ALT, ALKPHOS, BILITOT, PROT, ALBUMIN, INR in the last 168 hours.   INFECTIOUS  Recent Labs Lab 07/29/14 1004  07/29/14 1953 07/29/14 2230 07/30/14 0150  LATICACIDVEN 2.6*  < > 2.4* 2.5* 1.3  PROCALCITON <0.10  --   --   --   --   < > = values in this interval not displayed.   ENDOCRINE CBG (last 3)   Recent Labs  08/01/14 2123 08/02/14 0745 08/02/14 1143  GLUCAP 212* 136* 168*    IMAGING x48h Ct Chest Wo Contrast  08/01/2014   CLINICAL DATA:  Cough, fever, short of breath.  3 days of symptoms.  EXAM: CT CHEST WITHOUT CONTRAST  TECHNIQUE: Multidetector CT imaging of the chest was performed following the standard protocol without IV contrast.  COMPARISON:  Radiograph 07/31/2014  FINDINGS: Mediastinum/Nodes: No axillary or supraclavicular lymphadenopathy.  No mediastinal hilar lymphadenopathy. No pericardial fluid.  Lungs/Pleura:  There are bilateral moderate pleural effusions. There is consolidation in the left lower lobe with air bronchograms and volume loss.  Minimal atelectasis the right lung base and similar size effusion. There is small subpleural nodule in the right lower lobe measuring 5 mm (image 43). Upper lungs are clear.  Upper abdomen: Limited view of the upper abdomen is unremarkable.  Musculoskeletal: Several nodular densities in the left breast the deep to the nipple measure up to 13 mm (image 26, series 2). No aggressive osseous lesion.  IMPRESSION: 1. Bilateral moderate pleural effusions. 2. Right lower lobe atelectasis versus pneumonia.  Normal 3. Several nodular densities deep to the left nipple. Recommend diagnostic mammogram /  ultrasound for further evaluation (if patient is not current on screening mammogram).   Electronically Signed   By: Genevive Bi M.D.   On: 08/01/2014 14:37   I reviewed images 4/2  Anti-infectives    Start     Dose/Rate Route Frequency Ordered Stop   07/29/14 1400  cefTRIAXone (ROCEPHIN) 1 g in dextrose 5 % 50 mL IVPB - Premix     1 g 100 mL/hr over 30 Minutes Intravenous Every 24 hours 07/28/14 1848 08/05/14 1359   07/29/14 1400  azithromycin (ZITHROMAX) 500 mg in dextrose 5 % 250 mL IVPB     500 mg 250 mL/hr over 60 Minutes Intravenous Every 24 hours 07/28/14 1848 08/05/14 1359   07/28/14 1445  levofloxacin (LEVAQUIN) IVPB 500 mg     500 mg 100 mL/hr over 60 Minutes Intravenous  Once 07/28/14 1441 07/28/14 1557      Results for orders placed or performed during the hospital encounter of 07/28/14  Culture, blood (routine x 2) Call MD if unable to obtain prior to antibiotics being given     Status: None (Preliminary result)   Collection Time: 07/28/14  7:55 PM  Result Value Ref Range Status   Specimen Description BLOOD RIGHT WRIST  Final   Special Requests AEB 2CC  Final   Culture   Final           BLOOD CULTURE RECEIVED NO GROWTH TO DATE CULTURE WILL BE HELD FOR 5 DAYS BEFORE ISSUING A FINAL NEGATIVE REPORT Note: Culture results may be compromised due to an inadequate volume of blood received in culture bottles. Performed at [facility]    Report Status PENDING  Incomplete  Culture, blood (routine x 2) Call MD if unable to obtain prior to antibiotics being given     Status: None (Preliminary result)   Collection Time: 07/28/14  8:00 PM  Result Value Ref Range Status   Specimen Description BLOOD RIGHT ARM  Final   Special Requests AEB 5CC  Final   Culture   Final           BLOOD CULTURE RECEIVED NO GROWTH TO DATE CULTURE WILL BE HELD FOR 5 DAYS BEFORE ISSUING A FINAL NEGATIVE REPORT Performed at [facility]    Report Status PENDING  Incomplete  MRSA PCR  Screening     Status: None   Collection Time: 07/28/14 10:26 PM  Result Value Ref Range Status   MRSA by PCR NEGATIVE NEGATIVE Final    Comment:        The GeneXpert MRSA Assay (FDA approved for NASAL specimens only), is one component of a comprehensive MRSA colonization surveillance program. It is not intended to diagnose MRSA infection nor to guide or monitor treatment for MRSA  infections.   Culture, respiratory (NON-Expectorated)     Status: None   Collection Time: 07/29/14  1:22 AM  Result Value Ref Range Status   Specimen Description SPU  Final   Special Requests NONE  Final   Gram Stain   Final    MODERATE WBC PRESENT,BOTH PMN AND MONONUCLEAR RARE SQUAMOUS EPITHELIAL CELLS PRESENT FEW GRAM POSITIVE COCCI IN PAIRS IN CHAINS RARE GRAM POSITIVE RODS RARE GRAM NEGATIVE RODS    Culture   Final    NORMAL OROPHARYNGEAL FLORA [facility]formed at [facility]    Report Status 07/31/2014 FINAL  Final    ASSESSMENT / PLAN:  PULMONARY  A: #BAseline   - Chronic resp failure due to COPD NOS needing 2L in setting of prior smoking and Friedrich ATaxia  #Current  - Acute COPD exacerbation at admit with possible Pneumonia 07/28/2014 -  Slow improvement 08/01/2014 per [facility] despite optimal Rx for AECOPD and PCCM consult  - development of pleural effusion new 07/31/14 concerns for heart failure nos 4/3- images favor pneumonia L base w bilateral modest pleural effusions. BNP not very high and no R side fluid load signs. Cultures neg, but pneumonia more likely than CHF  P:   Cntinue AECOPD rx /CAP Rx with abx, steroids, nebs Investigate pleural effusions  - echo Consider US tap pleural effusion for culture, cells and chemistries    CD Ashliegh Parekh, MD Pulmonary and Critical Care Medicine [place] After 3:00PM [phone]  08/02/2014 1:32 PM

## 2014-08-02 NOTE — Progress Notes (Addendum)
Patient ID: [name], female   DOB: 11-28-1970, 44 y.o.   MRN: [id number] [facility] PROGRESS NOTE  [name] KZS:[id number] DOB: Aug 12, 1970 DOA: 07/28/2014 PCP: Philis Fendt, MD  Brief narrative:    44 y.o. female with a PMH of Friedrich's ataxia/wheelchair bound, end stage COPD, chronic respiratory failure on 2 L home oxygen who presented with a 2-3 day history of worsening dyspnea, associated fevers, chills and cough productive of thick green sputum. She also had associated nausea and vomiting for past few days prior to this admission. She was found to have pneumonia on admission and was started on azithromycin and rocephin.  Patient was in more respiratory distress the night of 07/28/2014. She has required a Ventimask for oxygen support. She stayed in step down unit because of hypotension which has improved with IV fluids. Her respiratory status improved however repeat chest x-ray 07/31/2014 showed increased severity of left lower lobe consolidation and developing right lower lobe infiltrates in addition to diffuse interstitial opacity bilaterally that seem to be increased compared to prior studies. Pulmonary consulted and work up for possible CHF initiated.   Assessment/Plan:    Principal Problem: Acute on chronic respiratory failure with hypoxia secondary to multilobar pneumonia and COPD exacerbation - Hypoxia secondary to combination of end-stage COPD and ongoing pneumonia. Chest x-ray on admission showed pneumonia in the lingular and left lower lobe. She required Ventimask initially to keep oxygen saturations above 90%. She was in step down unit, transferred to telemetry 07/31/2014. -  Her respiratory status remains stable however repeat chest x-ray 07/31/2014 showed increased severity of left lower lobe consolidation, developing right lower lobe infiltrate and diffuse interstitial opacity bilaterally that seem to be increased compared to prior studies. -  Patient  was on azithromycin and Rocephin from the admission. We will continue this abx regimen. - Pulmonary consulted and the thought was that pt may have CHF. BNP only mildly elevated. 2 D ECHO is pending.  -  Blood cultures to date are negative. Respiratory culture grew normal oropharyngeal flora.Marland Kitchen HIV is nonreactive, strep pneumonia, influenza and Legionella are all negative. - We are tapering solu-medrol, now every 24 hours.  -  Continue DuoNeb every 4 hours scheduled and every 4 hours as needed for shortness of breath or wheezing  Active Problems: Sepsis secondary to lobar pneumonia - Sepsis criteria met on admission with VS significant for hypothermia (96.35F), HR 126, RR 24, BP 81/60. In addition patient severely hypoxic with oxygen saturation as low as 75% requiring Ventimask for oxygen support. Source of infection pneumonia which was seen on chest x-ray done on the admission. Pt also had elevated lactic acid. Procalcitonin was WNL. Lactic acid subsequently normalized. -  Continue azithromycin and Rocephin. -  Blood cultures to date are negative.   Friedreich's ataxia - Wheelchair bound.    Hypocalcemia -  Calcium supplemented with vitamin D   Hypokalemia - Secondary to nebulizer treatments. Supplemented and then elevated at 5.3. - Repeat potassium within normal limits.    Severe protein calorie malnutrition - Nutrition consulted - Diet as tolerated     DVT prophylaxis - Lovenox ordered while patient is in hospital.     GI prophylaxis -  Patient on Protonix since she is on steroids.   Code Status: Full.  Family Communication:  plan of care discussed with the patient Disposition Plan:  Not stable for discharge at this time. Still treating for pneumonia.    IV access:  Peripheral IV  Procedures and diagnostic  studies:    CT chest without the contrast 08/01/2014: Bilateral moderate pleural effusions, RLL pneumonia or atelectasis. Several nodular densities deep to the left  nipple, recommended mammogram.   Dg Chest Port 1 View 07/31/2014   1. Increased severity of left lower lobe consolidation consistent with pneumonia. 2. Developing right lower lobe infiltrate possibly also pneumonia. 3. Diffuse interstitial opacity bilaterally, similar to 07/29/2014 but increased from 07/28/2014 and 04/11/2014. No vascular congestion to suggest this represents cardiogenic pulmonary edema. This may represent atypical pneumonia. 4. Increased bilateral pleural effusions.  Dg Chest Port 1 View 07/29/2014   Regressed left lung base pneumonia. Chronic lung disease with hyperinflation.    Dg Chest Port 1 View 07/28/2014  Increased lingula and left lower lobe streaky bronchovascular airspace process concerning for pneumonia.  Hyperinflation.    Medical Consultants:  Pulmonary  Other Consultants:  Nutrition Physical therapy Occupational therapy  IAnti-Infectives:   Azithromycin 07/28/2014 --> Rocephin 07/28/2014 -->    Leisa Lenz, MD  [facility] Pager [phone]  If 7PM-7AM, please contact night-coverage [email or web address] Password [email or web address] 08/02/2014, 10:20 AM   LOS: 5 days    HPI/Subjective: No acute overnight events.  Objective: Filed Vitals:   08/01/14 1348 08/01/14 2059 08/02/14 0537 08/02/14 1008  BP: 99/56 102/66 108/66   Pulse: 94 72 66   Temp: 98.6 F (37 C) 98.1 F (36.7 C) 98.5 F (36.9 C)   TempSrc: Oral Oral Oral   Resp: 16 15 16    Height:      Weight:      SpO2: 90% 95% 94% 93%    Intake/Output Summary (Last 24 hours) at 08/02/14 1020 Last data filed at 08/02/14 6503  Gross per 24 hour  Intake   2160 ml  Output   1485 ml  Net    675 ml    Exam:   General:  Pt is alert, awake, follow commands properly   Cardiovascular:  Rate controlled, S1,S2 (+)  Respiratory:  Slight wheezing, no rhonchi   Abdomen:  (+) BS, non tender, non distended abdomen   Extremities:  No leg swelling, palpable pulses  Neuro:  Nonfocal   Data  Reviewed: Basic Metabolic Panel:  Recent Labs Lab 07/28/14 1420 07/29/14 0418 07/30/14 0150 08/01/14 0330  NA 141 141 137 138  K 3.0* 5.3* 5.0 4.8  CL 86* 95* 99 94*  CO2  --  38* 34* 39*  GLUCOSE 138* 144* 178* 183*  BUN 5* 7 7 7   CREATININE 0.50 0.35* <0.30* <0.30*  CALCIUM  --  7.9* 7.3* 8.4   Liver Function Tests: No results for input(s): AST, ALT, ALKPHOS, BILITOT, PROT, ALBUMIN in the last 168 hours. No results for input(s): LIPASE, AMYLASE in the last 168 hours. No results for input(s): AMMONIA in the last 168 hours. CBC:  Recent Labs Lab 07/28/14 1413 07/28/14 1420 08/01/14 0330  WBC 6.2  --  5.4  NEUTROABS 4.9  --   --   HGB 15.9* 18.7* 13.2  HCT 50.7* 55.0* 42.2  MCV 107.9*  --  106.3*  PLT 235  --  262   Cardiac Enzymes:  Recent Labs Lab 08/01/14 1425 08/01/14 2120 08/02/14 0522  TROPONINI <0.03 <0.03 <0.03   BNP: Invalid input(s): POCBNP CBG:  Recent Labs Lab 07/31/14 2044 08/01/14 0801 08/01/14 1159 08/01/14 1642 08/01/14 2123  GLUCAP 143* 111* 159* 260* 212*    Culture, blood (routine x 2) Call MD if unable to obtain prior to antibiotics being given     Status:  None (Preliminary result)   Collection Time: 07/28/14  7:55 PM  Result Value Ref Range Status   Specimen Description BLOOD RIGHT WRIST  Final   Special Requests AEB 2CC  Final   Culture   Final           BLOOD CULTURE RECEIVED NO GROWTH TO DATE  Performed at [facility]    Report Status PENDING  Incomplete  Culture, blood (routine x 2) Call MD if unable to obtain prior to antibiotics being given     Status: None (Preliminary result)   Collection Time: 07/28/14  8:00 PM  Result Value Ref Range Status   Specimen Description BLOOD RIGHT ARM  Final   Special Requests AEB 5CC  Final   Culture   Final           BLOOD CULTURE RECEIVED NO GROWTH TO DATE     Report Status PENDING  Incomplete  MRSA PCR Screening     Status: None   Collection Time: 07/28/14 10:26 PM   Result Value Ref Range Status   MRSA by PCR NEGATIVE NEGATIVE Final  Culture, respiratory (NON-Expectorated)     Status: None   Collection Time: 07/29/14  1:22 AM  Result Value Ref Range Status   Specimen Description SPU  Final   Special Requests NONE  Final   Gram Stain   Final   Culture   Final    NORMAL OROPHARYNGEAL FLORA Performed at [facility]    Report Status 07/31/2014 FINAL  Final     Scheduled Meds: . antiseptic oral rinse  7 mL Mouth Rinse BID  . azithromycin  500 mg Intravenous Q24H  . calcium-vitamin D  1 tablet Oral BID  . cefTRIAXone (ROCEPHIN)  IV  1 g Intravenous Q24H  . enoxaparin (LOVENOX) injection  30 mg Subcutaneous Q24H  . feeding supplement (ENSURE ENLIVE)  237 mL Oral TID BM  . guaiFENesin  600 mg Oral BID  . insulin aspart  0-9 Units Subcutaneous TID WC  . ipratropium-albuterol  3 mL Nebulization QID  . methylPREDNISolone (SOLU-MEDROL) injection  60 mg Intravenous Q12H  . multivitamin with minerals  1 tablet Oral Daily  . pantoprazole  40 mg Oral Daily

## 2014-08-02 NOTE — Plan of Care (Signed)
Problem: Discharge Progression Outcomes Goal: Home O2 if indicated Outcome: Completed/Met Date Met:  08/02/14 Continuing O2 at home

## 2014-08-02 NOTE — Progress Notes (Signed)
  Echocardiogram 2D Echocardiogram has been performed.  Aris EvertsRix, Shin Lamour A 08/02/2014, 12:14 PM

## 2014-08-03 DIAGNOSIS — J181 Lobar pneumonia, unspecified organism: Secondary | ICD-10-CM | POA: Insufficient documentation

## 2014-08-03 LAB — GLUCOSE, CAPILLARY
GLUCOSE-CAPILLARY: 74 mg/dL (ref 70–99)
Glucose-Capillary: 154 mg/dL — ABNORMAL HIGH (ref 70–99)

## 2014-08-03 MED ORDER — FAMOTIDINE 20 MG PO TABS: 20.0000 mg | ORAL_TABLET | Freq: Every day | ORAL | Status: AC

## 2014-08-03 MED ORDER — GUAIFENESIN ER 600 MG PO TB12: 600.0000 mg | ORAL_TABLET | Freq: Two times a day (BID) | ORAL | Status: AC | PRN

## 2014-08-03 MED ORDER — LEVOFLOXACIN 750 MG PO TABS: 750.0000 mg | ORAL_TABLET | Freq: Every day | ORAL | Status: DC

## 2014-08-03 MED ORDER — PREDNISONE 5 MG PO TABS: 50.0000 mg | ORAL_TABLET | Freq: Every day | ORAL | Status: DC

## 2014-08-03 NOTE — Discharge Instructions (Signed)
Chronic Obstructive Pulmonary Disease °Chronic obstructive pulmonary disease (COPD) is a common lung condition in which airflow from the lungs is limited. COPD is a general term that can be used to describe many different lung problems that limit airflow, including both chronic bronchitis and emphysema.  If you have COPD, your lung function will probably never return to normal, but there are measures you can take to improve lung function and make yourself feel better.  °CAUSES  °· Smoking (common).   °· Exposure to secondhand smoke.   °· Genetic problems. °· Chronic inflammatory lung diseases or recurrent infections. °SYMPTOMS  °· Shortness of breath, especially with physical activity.   °· Deep, persistent (chronic) cough with a large amount of thick mucus.   °· Wheezing.   °· Rapid breaths (tachypnea).   °· Gray or bluish discoloration (cyanosis) of the skin, especially in fingers, toes, or lips.   °· Fatigue.   °· Weight loss.   °· Frequent infections or episodes when breathing symptoms become much worse (exacerbations).   °· Chest tightness. °DIAGNOSIS  °Your health care provider will take a medical history and perform a physical examination to make the initial diagnosis.  Additional tests for COPD may include:  °· Lung (pulmonary) function tests. °· Chest X-ray. °· CT scan. °· Blood tests. °TREATMENT  °Treatment available to help you feel better when you have COPD includes:  °· Inhaler and nebulizer medicines. These help manage the symptoms of COPD and make your breathing more comfortable. °· Supplemental oxygen. Supplemental oxygen is only helpful if you have a low oxygen level in your blood.   °· Exercise and physical activity. These are beneficial for nearly all people with COPD. Some people may also benefit from a pulmonary rehabilitation program. °HOME CARE INSTRUCTIONS  °· Take all medicines (inhaled or pills) as directed by your health care provider. °· Avoid over-the-counter medicines or cough syrups  that dry up your airway (such as antihistamines) and slow down the elimination of secretions unless instructed otherwise by your health care provider.   °· If you are a smoker, the most important thing that you can do is stop smoking. Continuing to smoke will cause further lung damage and breathing trouble. Ask your health care provider for help with quitting smoking. He or she can direct you to community resources or hospitals that provide support. °· Avoid exposure to irritants such as smoke, chemicals, and fumes that aggravate your breathing. °· Use oxygen therapy and pulmonary rehabilitation if directed by your health care provider. If you require home oxygen therapy, ask your health care provider whether you should purchase a pulse oximeter to measure your oxygen level at home.   °· Avoid contact with individuals who have a contagious illness. °· Avoid extreme temperature and humidity changes. °· Eat healthy foods. Eating smaller, more frequent meals and resting before meals may help you maintain your strength. °· Stay active, but balance activity with periods of rest. Exercise and physical activity will help you maintain your ability to do things you want to do. °· Preventing infection and hospitalization is very important when you have COPD. Make sure to receive all the vaccines your health care provider recommends, especially the pneumococcal and influenza vaccines. Ask your health care provider whether you need a pneumonia vaccine. °· Learn and use relaxation techniques to manage stress. °· Learn and use controlled breathing techniques as directed by your health care provider. Controlled breathing techniques include:   °· Pursed lip breathing. Start by breathing in (inhaling) through your nose for 1 second. Then, purse your lips as if you were   going to whistle and breathe out (exhale) through the pursed lips for 2 seconds.   °· Diaphragmatic breathing. Start by putting one hand on your abdomen just above  your waist. Inhale slowly through your nose. The hand on your abdomen should move out. Then purse your lips and exhale slowly. You should be able to feel the hand on your abdomen moving in as you exhale.   °· Learn and use controlled coughing to clear mucus from your lungs. Controlled coughing is a series of short, progressive coughs. The steps of controlled coughing are:   °1. Lean your head slightly forward.   °2. Breathe in deeply using diaphragmatic breathing.   °3. Try to hold your breath for 3 seconds.   °4. Keep your mouth slightly open while coughing twice.   °5. Spit any mucus out into a tissue.   °6. Rest and repeat the steps once or twice as needed. °SEEK MEDICAL CARE IF:  °· You are coughing up more mucus than usual.   °· There is a change in the color or thickness of your mucus.   °· Your breathing is more labored than usual.   °· Your breathing is faster than usual.   °SEEK IMMEDIATE MEDICAL CARE IF:  °· You have shortness of breath while you are resting.   °· You have shortness of breath that prevents you from: °¨ Being able to talk.   °¨ Performing your usual physical activities.   °· You have chest pain lasting longer than 5 minutes.   °· Your skin color is more cyanotic than usual. °· You measure low oxygen saturations for longer than 5 minutes with a pulse oximeter. °MAKE SURE YOU:  °· Understand these instructions. °· Will watch your condition. °· Will get help right away if you are not doing well or get worse. °Document Released: 01/26/2005 Document Revised: 09/02/2013 Document Reviewed: 12/13/2012 °[employer]® Patient Information ©2015 [employer]. This information is not intended to replace advice given to you by your health care provider. Make sure you discuss any questions you have with your health care provider. ° °Pneumonia °Pneumonia is an infection of the lungs.  °CAUSES °Pneumonia may be caused by bacteria or a virus. Usually, these infections are caused by breathing infectious  particles into the lungs (respiratory tract). °SIGNS AND SYMPTOMS  °· Cough. °· Fever. °· Chest pain. °· Increased rate of breathing. °· Wheezing. °· Mucus production. °DIAGNOSIS  °If you have the common symptoms of pneumonia, your health care provider will typically confirm the diagnosis with a chest X-ray. The X-ray will show an abnormality in the lung (pulmonary infiltrate) if you have pneumonia. Other tests of your blood, urine, or sputum may be done to find the specific cause of your pneumonia. Your health care provider may also do tests (blood gases or pulse oximetry) to see how well your lungs are working. °TREATMENT  °Some forms of pneumonia may be spread to other people when you cough or sneeze. You may be asked to wear a mask before and during your exam. Pneumonia that is caused by bacteria is treated with antibiotic medicine. Pneumonia that is caused by the influenza virus may be treated with an antiviral medicine. Most other viral infections must run their course. These infections will not respond to antibiotics.  °HOME CARE INSTRUCTIONS  °· Cough suppressants may be used if you are losing too much rest. However, coughing protects you by clearing your lungs. You should avoid using cough suppressants if you can. °· Your health care provider may have prescribed medicine if he or she thinks your pneumonia   is caused by bacteria or influenza. Finish your medicine even if you start to feel better. °· Your health care provider may also prescribe an expectorant. This loosens the mucus to be coughed up. °· Take medicines only as directed by your health care provider. °· Do not smoke. Smoking is a common cause of bronchitis and can contribute to pneumonia. If you are a smoker and continue to smoke, your cough may last several weeks after your pneumonia has cleared. °· A cold steam vaporizer or humidifier in your room or home may help loosen mucus. °· Coughing is often worse at night. Sleeping in a semi-upright  position in a recliner or using a couple pillows under your head will help with this. °· Get rest as you feel it is needed. Your body will usually let you know when you need to rest. °PREVENTION °A pneumococcal shot (vaccine) is available to prevent a common bacterial cause of pneumonia. This is usually suggested for: °· People over 65 years old. °· Patients on chemotherapy. °· People with chronic lung problems, such as bronchitis or emphysema. °· People with immune system problems. °If you are over 65 or have a high risk condition, you may receive the pneumococcal vaccine if you have not received it before. In some countries, a routine influenza vaccine is also recommended. This vaccine can help prevent some cases of pneumonia. You may be offered the influenza vaccine as part of your care. °If you smoke, it is time to quit. You may receive instructions on how to stop smoking. Your health care provider can provide medicines and counseling to help you quit. °SEEK MEDICAL CARE IF: °You have a fever. °SEEK IMMEDIATE MEDICAL CARE IF:  °· Your illness becomes worse. This is especially true if you are elderly or weakened from any other disease. °· You cannot control your cough with suppressants and are losing sleep. °· You begin coughing up blood. °· You develop pain which is getting worse or is uncontrolled with medicines. °· Any of the symptoms which initially brought you in for treatment are getting worse rather than better. °· You develop shortness of breath or chest pain. °MAKE SURE YOU:  °· Understand these instructions. °· Will watch your condition. °· Will get help right away if you are not doing well or get worse. °Document Released: 04/18/2005 Document Revised: 09/02/2013 Document Reviewed: 07/08/2010 °[employer]® Patient Information ©2015 [employer]. This information is not intended to replace advice given to you by your health care provider. Make sure you discuss any questions you have with your health care  provider. ° °

## 2014-08-03 NOTE — Progress Notes (Signed)
Patient discharged home, discharge instructions given and explained to patient/husband and they verbalized understanding, patient denies any pain/distress. no wound noted, skin intact, Accompanied home by husband, transported to the car by staff via wheelchair.

## 2014-08-03 NOTE — Discharge Summary (Signed)
Physician Discharge Summary  [name]:[id number] DOB: 1970-11-08 DOA: 07/28/2014  PCP: Philis Fendt, MD  Admit date: 07/28/2014 Discharge date: 08/03/2014  Recommendations for Outpatient Follow-up:  Taper down prednisone starting from 50 mg a day, taper down by 5 mg a day down to 0 mg. for ex, today 50 mg, tomorrow 45 mg, then 40 mg the following day and etc...  Take Levaquin for 12 days on discharge  Discharge Diagnoses:  Principal Problem:   COPD exacerbation Active Problems:   Friedreich's ataxia   Pneumonia   Acute on chronic respiratory failure   Hypokalemia   Protein-calorie malnutrition, severe    Discharge Condition: stable; she insists on going home today   Diet recommendation: as tolerated   History of present illness:  44 y.o. female with a PMH of Friedrich's ataxia/wheelchair bound, end stage COPD, chronic respiratory failure on 2 L home oxygen who presented with a 2-3 day history of worsening dyspnea, associated fevers, chills and cough productive of thick green sputum. She also had associated nausea and vomiting for past few days prior to this admission. She was found to have pneumonia on admission and was started on azithromycin and rocephin.  Patient was in more respiratory distress the night of 07/28/2014. She has required a Ventimask for oxygen support. She stayed in step down unit because of hypotension which has improved with IV fluids. Her respiratory status improved however repeat chest x-ray 07/31/2014 showed increased severity of left lower lobe consolidation and developing right lower lobe infiltrates in addition to diffuse interstitial opacity bilaterally that seem to be increased compared to prior studies. Pulmonary consulted and work up for possible CHF initiated although it seems that COPD and PNA is main etiology of symptoms rather than CHF  Hospital Course:   Principal Problem: Acute on chronic respiratory failure with hypoxia secondary  to multilobar pneumonia and COPD exacerbation - Hypoxia secondary to combination of end-stage COPD and ongoing pneumonia. Chest x-ray on admission showed pneumonia in the lingular and left lower lobe. She required Ventimask initially to keep oxygen saturations above 90%. She was in step down unit, transferred to telemetry 07/31/2014. - Her respiratory status remains stable - She had chest x-ray 07/31/2014 which showed increased severity of left lower lobe consolidation, developing right lower lobe infiltrate and diffuse interstitial opacity bilaterally that seem to be increased compared to prior studies. - Patient was on azithromycin and Rocephin from the admission.  - Pulmonary consulted and the thought was that pt may have CHF. BNP only mildly elevated. - Blood cultures to date are negative. Respiratory culture grew normal oropharyngeal flora.Marland Kitchen HIV is nonreactive, strep pneumonia, influenza and Legionella are all negative. - She insists on going home today. She says she feels much better. Will discharge on Levaquin and tapering prednisone as indicated above.   Active Problems: Sepsis secondary to lobar pneumonia - Sepsis criteria met on admission with VS significant for hypothermia (96.56F), HR 126, RR 24, BP 81/60. In addition patient severely hypoxic with oxygen saturation as low as 75% requiring Ventimask for oxygen support. Source of infection pneumonia which was seen on chest x-ray done on the admission. Pt also ahd elevated lactic acid. Procalcitonin was WNL. Lactic acid subsequently normalized. - Continued azithromycin and Rocephin in hospital. Levaquin on discharge.  - Blood cultures to date are negative.   Friedreich's ataxia - Wheelchair bound.   Hypocalcemia - Calcium supplemented with vitamin D   Hypokalemia - Secondary to nebulizer treatments. Supplemented and then elevated at 5.3. -  Repeat potassium within normal limits.   Severe protein calorie malnutrition -  Nutrition consulted - Diet as tolerated    DVT prophylaxis - Lovenox ordered while patient is in hospital.   GI prophylaxis - Patient on Pepcid since she is on steroids.   Code Status: Full.  Family Communication: plan of care discussed with the patient    Signed:  Leisa Lenz, MD  [facility] 08/03/2014, 10:05 AM  Pager #: [phone]   Discharge Exam: Filed Vitals:   08/03/14 0507  BP: 101/68  Pulse: 79  Temp: 97.8 F (36.6 C)  Resp: 20   Filed Vitals:   08/02/14 2051 08/02/14 2059 08/03/14 0507 08/03/14 0912  BP: 105/61  101/68   Pulse: 89  79   Temp: 97.7 F (36.5 C)  97.8 F (36.6 C)   TempSrc: Oral  Oral   Resp: 20  20   Height:      Weight:      SpO2: 90% 90% 90% 91%    General: Pt is alert, follows commands appropriately, not in acute distress Cardiovascular: Regular rate and rhythm, S1/S2 +, no murmurs Respiratory: diminished but no wheezing  Abdominal: Soft, non tender, non distended, bowel sounds +, no guarding Extremities: no edema, no cyanosis, pulses palpable bilaterally DP and PT Neuro: Grossly nonfocal  Discharge Instructions  Discharge Instructions    Call MD for:  difficulty breathing, headache or visual disturbances    Complete by:  As directed      Call MD for:  persistant nausea and vomiting    Complete by:  As directed      Call MD for:  severe uncontrolled pain    Complete by:  As directed      Diet - low sodium heart healthy    Complete by:  As directed      Discharge instructions    Complete by:  As directed   Taper down prednisone starting from 50 mg a day, taper down by 5 mg a day down to 0 mg. for ex, today 50 mg, tomorrow 45 mg, then 40 mg the following day and etc...  Take Levaquin for 12 days on discharge     Increase activity slowly    Complete by:  As directed             Medication List    TAKE these medications        famotidine 20 MG tablet  Commonly known as:  PEPCID  Take 1 tablet (20  mg total) by mouth daily.     guaiFENesin 600 MG 12 hr tablet  Commonly known as:  MUCINEX  Take 1 tablet (600 mg total) by mouth 2 (two) times daily as needed for cough or to loosen phlegm.     ipratropium 0.02 % nebulizer solution  Commonly known as:  ATROVENT  Take 2.5 mLs (0.5 mg total) by nebulization 3 (three) times daily. Dx 496     levofloxacin 750 MG tablet  Commonly known as:  LEVAQUIN  Take 1 tablet (750 mg total) by mouth daily.     predniSONE 5 MG tablet  Commonly known as:  DELTASONE  Take 10 tablets (50 mg total) by mouth daily with breakfast.           Follow-up Information    Follow up with AVBUERE,EDWIN A, MD. Schedule an appointment as soon as possible for a visit in 2 weeks.   Specialty:  Internal Medicine   Why:  Follow up appt  after recent hospitalization   Contact information:   [place] [address] [phone]        The results of significant diagnostics from this hospitalization (including imaging, microbiology, ancillary and laboratory) are listed below for reference.    Significant Diagnostic Studies: Ct Chest Wo Contrast  08/01/2014   CLINICAL DATA:  Cough, fever, short of breath.  3 days of symptoms.  EXAM: CT CHEST WITHOUT CONTRAST  TECHNIQUE: Multidetector CT imaging of the chest was performed following the standard protocol without IV contrast.  COMPARISON:  Radiograph 07/31/2014  FINDINGS: Mediastinum/Nodes: No axillary or supraclavicular lymphadenopathy. No mediastinal hilar lymphadenopathy. No pericardial fluid.  Lungs/Pleura:  There are bilateral moderate pleural effusions. There is consolidation in the left lower lobe with air bronchograms and volume loss.  Minimal atelectasis the right lung base and similar size effusion. There is small subpleural nodule in the right lower lobe measuring 5 mm (image 43). Upper lungs are clear.  Upper abdomen: Limited view of the upper abdomen is unremarkable.  Musculoskeletal: Several  nodular densities in the left breast the deep to the nipple measure up to 13 mm (image 26, series 2). No aggressive osseous lesion.  IMPRESSION: 1. Bilateral moderate pleural effusions. 2. Right lower lobe atelectasis versus pneumonia.  Normal 3. Several nodular densities deep to the left nipple. Recommend diagnostic mammogram / ultrasound for further evaluation (if patient is not current on screening mammogram).   Electronically Signed   By: Suzy Bouchard M.D.   On: 08/01/2014 14:37   Dg Chest Port 1 View  07/31/2014   CLINICAL DATA:  Subsequent evaluation shortness of breath, pneumonia, COPD  EXAM: PORTABLE CHEST - 1 VIEW  COMPARISON:  07/29/2014  FINDINGS: Heart size remains normal. Moderate interstitial prominence again identified diffusely bilaterally. Interval development or increase in bilateral pleural effusions, small on the right and small to moderate on the left. Increased consolidation with air bronchograms throughout the left lower lobe. New hazy opacity right lower lobe.  No evidence of vascular congestion.  IMPRESSION: 1. Increased severity of left lower lobe consolidation consistent with pneumonia. 2. Developing right lower lobe infiltrate possibly also pneumonia. 3. Diffuse interstitial opacity bilaterally, similar to 07/29/2014 but increased from 07/28/2014 and 04/11/2014. No vascular congestion to suggest this represents cardiogenic pulmonary edema. This may represent atypical pneumonia. 4. Increased bilateral pleural effusions.   Electronically Signed   By: Skipper Cliche M.D.   On: 07/31/2014 08:55   Dg Chest Port 1 View  07/29/2014   CLINICAL DATA:  44 year old female with shortness of breath cough and congestion. Initial encounter.  EXAM: PORTABLE CHEST - 1 VIEW  COMPARISON:  07/28/2014 and earlier.  FINDINGS: Portable AP semi upright view at 0633 hr. Chronic large lung volumes. Normal cardiac size and mediastinal contours. Visualized tracheal air column is within normal limits. No  pneumothorax. No pleural effusion or consolidation. Mildly decreased Patchy and confluent opacity at the left lung base. Underlying increased interstitial markings persist. No areas of worsening ventilation.  IMPRESSION: Regressed left lung base pneumonia. Chronic lung disease with hyperinflation.   Electronically Signed   By: Genevie Ann M.D.   On: 07/29/2014 07:01   Dg Chest Port 1 View  07/28/2014   CLINICAL DATA:  Chronic COPD. Acute shortness of breath for 3 days. Decreased oxygen saturation.  EXAM: PORTABLE CHEST - 1 VIEW  COMPARISON:  04/11/2014  FINDINGS: Background COPD/emphysema noted with mild hyperinflation. Streaky increased lingula and left lower lobe bronchovascular opacity compared to the prior  study, concerning for developing left lung pneumonia. Mild cardiomegaly. Normal vascularity. No effusion or pneumothorax. Trachea midline. Mild thoracic scoliosis noted.  IMPRESSION: Increased lingula and left lower lobe streaky bronchovascular airspace process concerning for pneumonia.  Hyperinflation.   Electronically Signed   By: Jerilynn Mages.  Shick M.D.   On: 07/28/2014 14:11    Microbiology: Recent Results (from the past 240 hour(s))  Culture, blood (routine x 2) Call MD if unable to obtain prior to antibiotics being given     Status: None (Preliminary result)   Collection Time: 07/28/14  7:55 PM  Result Value Ref Range Status   Specimen Description BLOOD RIGHT WRIST  Final   Special Requests AEB 2CC  Final   Culture   Final           BLOOD CULTURE RECEIVED NO GROWTH TO DATE CULTURE WILL BE HELD FOR 5 DAYS BEFORE ISSUING A FINAL NEGATIVE REPORT Note: Culture results may be compromised due to an inadequate volume of blood received in culture bottles. Performed at [facility]    Report Status PENDING  Incomplete  Culture, blood (routine x 2) Call MD if unable to obtain prior to antibiotics being given     Status: None (Preliminary result)   Collection Time: 07/28/14  8:00 PM  Result Value  Ref Range Status   Specimen Description BLOOD RIGHT ARM  Final   Special Requests AEB 5CC  Final   Culture   Final           BLOOD CULTURE RECEIVED NO GROWTH TO DATE CULTURE WILL BE HELD FOR 5 DAYS BEFORE ISSUING A FINAL NEGATIVE REPORT Performed at [facility]    Report Status PENDING  Incomplete  MRSA PCR Screening     Status: None   Collection Time: 07/28/14 10:26 PM  Result Value Ref Range Status   MRSA by PCR NEGATIVE NEGATIVE Final    Comment:        The GeneXpert MRSA Assay (FDA approved for NASAL specimens only), is one component of a comprehensive MRSA colonization surveillance program. It is not intended to diagnose MRSA infection nor to guide or monitor treatment for MRSA infections.   Culture, respiratory (NON-Expectorated)     Status: None   Collection Time: 07/29/14  1:22 AM  Result Value Ref Range Status   Specimen Description SPU  Final   Special Requests NONE  Final   Gram Stain   Final    MODERATE WBC PRESENT,BOTH PMN AND MONONUCLEAR RARE SQUAMOUS EPITHELIAL CELLS PRESENT FEW GRAM POSITIVE COCCI IN PAIRS IN CHAINS RARE GRAM POSITIVE RODS RARE GRAM NEGATIVE RODS    Culture   Final    NORMAL OROPHARYNGEAL FLORA Performed at [facility]    Report Status 07/31/2014 FINAL  Final     Labs: Basic Metabolic Panel:  Recent Labs Lab 07/28/14 1420 07/29/14 0418 07/30/14 0150 08/01/14 0330  NA 141 141 137 138  K 3.0* 5.3* 5.0 4.8  CL 86* 95* 99 94*  CO2  --  38* 34* 39*  GLUCOSE 138* 144* 178* 183*  BUN 5* _0 CREATININE 0.50 0.35* <0.30* <0.30*  CALCIUM  --  7.9* 7.3* 8.4   Liver Function Tests: No results for input(s): AST, ALT, ALKPHOS, BILITOT, PROT, ALBUMIN in the last 168 hours. No results for input(s): LIPASE, AMYLASE in the last 168 hours. No results for input(s): AMMONIA in the last 168 hours. CBC:  Recent Labs Lab 07/28/14 1413 07/28/14 1420 08/01/14 0330  WBC 6.2  --  5.4  NEUTROABS 4.9  --   --   HGB 15.9*  18.7* 13.2  HCT 50.7* 55.0* 42.2  MCV 107.9*  --  106.3*  PLT 235  --  262   Cardiac Enzymes:  Recent Labs Lab 08/01/14 1425 08/01/14 2120 08/02/14 0522 08/02/14 1319  TROPONINI <0.03 <0.03 <0.03 <0.03   BNP: BNP (last 3 results)  Recent Labs  08/01/14 1425  BNP 161.8*    ProBNP (last 3 results) No results for input(s): PROBNP in the last 8760 hours.  CBG:  Recent Labs Lab 08/02/14 0745 08/02/14 1143 08/02/14 1708 08/02/14 2211 08/03/14 0752  GLUCAP 136* 168* 216* 154* 74    Time coordinating discharge: Over 30 minutes

## 2014-08-04 LAB — CULTURE, BLOOD (ROUTINE X 2)
Culture: NO GROWTH
Culture: NO GROWTH

## 2014-09-08 ENCOUNTER — Encounter: Payer: Self-pay | Admitting: Adult Health

## 2014-09-08 ENCOUNTER — Ambulatory Visit (INDEPENDENT_AMBULATORY_CARE_PROVIDER_SITE_OTHER): Payer: Medicaid Other | Admitting: Adult Health

## 2014-09-08 ENCOUNTER — Ambulatory Visit (INDEPENDENT_AMBULATORY_CARE_PROVIDER_SITE_OTHER)
Admission: RE | Admit: 2014-09-08 | Discharge: 2014-09-08 | Disposition: A | Discharge status: Home / Self Care | Payer: Medicaid Other | Source: Ambulatory Visit | Attending: Adult Health | Admitting: Adult Health

## 2014-09-08 VITALS — BP 120/88 | HR 107 | Temp 98.1°F | Wt <= 1120 oz

## 2014-09-08 DIAGNOSIS — J189 Pneumonia, unspecified organism: Secondary | ICD-10-CM

## 2014-09-08 DIAGNOSIS — R922 Inconclusive mammogram: Secondary | ICD-10-CM | POA: Insufficient documentation

## 2014-09-08 DIAGNOSIS — J441 Chronic obstructive pulmonary disease with (acute) exacerbation: Secondary | ICD-10-CM

## 2014-09-08 DIAGNOSIS — J449 Chronic obstructive pulmonary disease, unspecified: Secondary | ICD-10-CM

## 2014-09-08 NOTE — Addendum Note (Signed)
Addended by: Meyer CoryAHMAD, Forest Redwine R on: 09/08/2014 03:15 PM   Modules accepted: Orders

## 2014-09-08 NOTE — Progress Notes (Signed)
   Subjective:    Patient ID: [name], female    DOB: [birth year], 44 y.o.   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 Alpha 1-antitrypsin was >301. Discharged on O2  PFTs 4/11 FEV1 0.84 (34%) She has smoked since age 44. Father had severe COPD.   Had an ankle fx 04/12/12 requiring surgery -ORIF w/ hardware/scres implant . Unfortunately got a MRSA infection . Had to have surgery 06/2012 for removal of implant.  Required prolonged abx.    03/10/2014  Chief Complaint  Patient presents with  . Follow-up    F/U COPD; coughing to the point of vomiting; chest congestion; no fever; needs new Neb machine, lost old one when she moved   3275m FU for COPD  Remains wheelchair bound for outdoors, uses walker at home, Ataxia is worse  Accompanied by husband  Patient has had no hospitalizations or emergency room visits since last visit.  Continues on oxygen therapy 24/7 At 2 L.  Remains on symbicort  Compliant with albuterol nebs -needs new Rx for neb machine C/o dysphagia, regardless of consistency   09/08/2014 post hospital follow-up Patient presents for a post hospital follow-up. She was admitted March 28 through April 3 for COPD exacerbation and pneumonia She was treated with aggressive IV antibiotics, steroids, nebulized bronchodilators She did have some hypotension on admission and required IV fluid challenge with good response Chest x-ray showed a bilateral pneumonia and pleural effusions .  CT chest showed bilateral moderate pleural effusions, Left lower lobe consolidation and atelectasis in the right base. Several nodular densities deep to the left nipple. Echo showed an EF of 60-65%.. Weight is down 17 pounds since last seen in the office 6 months ago. He was 69 pounds today. She admits she  is not taking her meds as she can not afford them  She has medicaid but does not have any money .  We gave her symbicort samples.  Discussed hospice care today , she agrees .  Discussed code status today , EOL discussion  She wishes for DNR , husband is in agreement.  cxr today is clear .        Review of Systems neg for any significant sore throat, dysphagia, itching, sneezing, nasal congestion or excess/ purulent secretions, fever, chills, sweats, unintended wt loss, pleuritic or exertional cp, hempoptysis, orthopnea pnd or change in chronic leg swelling. Also denies presyncope, palpitations, heartburn, abdominal pain, nausea, vomiting, diarrhea or change in bowel or urinary habits, dysuria,hematuria, rash, arthralgias, visual complaints, headache, numbness weakness or ataxia.     Objective:   Physical Exam  Gen. Pleasant, poorly-nourished, in no distress, normal affect, in wheelchair, frail  ENT - no lesions, no post nasal drip Neck: No JVD, no thyromegaly, no carotid bruits Lungs: no use of accessory muscles, no dullness to percussion,  Few trace rhonchi  +smells of smoke Cardiovascular: Rhythm regular, heart sounds  normal, no murmurs or gallops, no peripheral edema Abdomen: soft and non-tender, no hepatosplenomegaly, BS normal. Musculoskeletal: No deformities, no cyanosis or clubbing Neuro:  alert, non focal, paraparesis   CXR 09/08/2014  NAD , lung clear   Reviewed independently and agree with  interpretation     Assessment & Plan:

## 2014-09-08 NOTE — Assessment & Plan Note (Signed)
CT chest  Showed  Several nodular densities deep to the left nipple. Refer to PCP for further evaluation  Pt and husband aware

## 2014-09-08 NOTE — Assessment & Plan Note (Signed)
Recent hospitalization for COPD exacerbation, now resolving Pt has limited funds and is clearly declining with wt now under 70bs EOL discussion -wishes for DNR status  Refer to hospice   Plan  Restart Symbicort 2 puffs twice daily. Restart Atrovent Neb Three times a day  .  continue on oxygen therapy at 2 L   Add Boost Three times a day  W/ meals.  Follow up Dr. Vassie LollAlva  in 6 weeks and As needed  Please contact office for sooner follow up if symptoms do not improve or worsen or seek emergency care   Refer to Hospice

## 2014-09-08 NOTE — Assessment & Plan Note (Signed)
Recent hospitalization for pneumonia  chest x-ray today shows clearance

## 2014-09-08 NOTE — Patient Instructions (Addendum)
Restart Symbicort 2 puffs twice daily. Restart Atrovent Neb Three times a day  .  continue on oxygen therapy at 2 L Please follow up with Primary MD as you need to follow abnormal breast tissue on recent scan .  Chest xray today .  Add Boost Three times a day  W/ meals.  Follow up Dr. Vassie LollAlva  in 6 weeks and As needed  Please contact office for sooner follow up if symptoms do not improve or worsen or seek emergency care   Refer to Hospice

## 2014-09-09 NOTE — Progress Notes (Signed)
Quick Note:  Called spoke with patient's significant other Clyde. Advised of cxr results / recs as stated by TP. Spouse verbalized understanding and denied any questions. ______

## 2014-09-10 NOTE — Progress Notes (Signed)
Reviewed & agree with plan  

## 2014-09-15 ENCOUNTER — Telehealth: Payer: Self-pay | Admitting: Pulmonary Disease

## 2014-09-15 MED ORDER — MORPHINE SULFATE (CONCENTRATE) 20 MG/ML PO SOLN: 5.0000 mg | Freq: Four times a day (QID) | ORAL | Status: AC | PRN

## 2014-09-15 NOTE — Telephone Encounter (Signed)
[facility]. Upon arrival to today's visit, Rene KocherRegina notes pt's o2 sat 88% on 2.5 lpm. She increased o2 to 3lpm with sats increasing to 91%.  Resp 20.   States pt has a tight cough; prod at times, unsure of mucus color. Today, pt's lungs completely diminished/no air movement in all fields.   Pt having episodes of SOB and anxiety.  Pt keeping o2 on and using breathing tx but no other orders for sxs. Regina requesting further recs for these episodes - ? Prn ativan and/or morphine. States she called Dr. Gibson RampFeldman who rec she call our office as pt is a recently new admission for them. RA not available today.  Will send to doc of the day.  MR, please advise. Thank you.

## 2014-09-15 NOTE — Telephone Encounter (Signed)
[facility] soln.  Given to MR to sign.  Faxed to [facility] [place] Nothing further needed.

## 2014-09-15 NOTE — Telephone Encounter (Signed)
Start withg  Morphine prn 4mg  IV q6h prn or solution

## 2014-10-07 ENCOUNTER — Other Ambulatory Visit: Payer: Self-pay | Admitting: Pulmonary Disease

## 2014-10-15 ENCOUNTER — Emergency Department (HOSPITAL_COMMUNITY)
Admission: EM | Admit: 2014-10-15 | Discharge: 2014-10-15 | Disposition: A | Discharge status: Home / Self Care | Payer: Medicaid Other | Attending: Emergency Medicine | Admitting: Emergency Medicine

## 2014-10-15 ENCOUNTER — Emergency Department (HOSPITAL_COMMUNITY): Payer: Medicaid Other

## 2014-10-15 ENCOUNTER — Encounter (HOSPITAL_COMMUNITY): Payer: Self-pay | Admitting: *Deleted

## 2014-10-15 DIAGNOSIS — Z87891 Personal history of nicotine dependence: Secondary | ICD-10-CM | POA: Insufficient documentation

## 2014-10-15 DIAGNOSIS — S8991XA Unspecified injury of right lower leg, initial encounter: Secondary | ICD-10-CM | POA: Insufficient documentation

## 2014-10-15 DIAGNOSIS — M25561 Pain in right knee: Secondary | ICD-10-CM

## 2014-10-15 DIAGNOSIS — Y999 Unspecified external cause status: Secondary | ICD-10-CM | POA: Diagnosis not present

## 2014-10-15 DIAGNOSIS — Z8781 Personal history of (healed) traumatic fracture: Secondary | ICD-10-CM | POA: Diagnosis not present

## 2014-10-15 DIAGNOSIS — Z9981 Dependence on supplemental oxygen: Secondary | ICD-10-CM | POA: Diagnosis not present

## 2014-10-15 DIAGNOSIS — Z8719 Personal history of other diseases of the digestive system: Secondary | ICD-10-CM | POA: Diagnosis not present

## 2014-10-15 DIAGNOSIS — W1830XA Fall on same level, unspecified, initial encounter: Secondary | ICD-10-CM | POA: Insufficient documentation

## 2014-10-15 DIAGNOSIS — Y9389 Activity, other specified: Secondary | ICD-10-CM | POA: Insufficient documentation

## 2014-10-15 DIAGNOSIS — J449 Chronic obstructive pulmonary disease, unspecified: Secondary | ICD-10-CM | POA: Insufficient documentation

## 2014-10-15 DIAGNOSIS — Y92129 Unspecified place in nursing home as the place of occurrence of the external cause: Secondary | ICD-10-CM | POA: Insufficient documentation

## 2014-10-15 DIAGNOSIS — E119 Type 2 diabetes mellitus without complications: Secondary | ICD-10-CM | POA: Insufficient documentation

## 2014-10-15 DIAGNOSIS — Z8669 Personal history of other diseases of the nervous system and sense organs: Secondary | ICD-10-CM | POA: Insufficient documentation

## 2014-10-15 DIAGNOSIS — Z79899 Other long term (current) drug therapy: Secondary | ICD-10-CM | POA: Insufficient documentation

## 2014-10-15 NOTE — Discharge Instructions (Signed)
Please read and follow all provided instructions.  Your diagnoses today include:  1. Right knee pain     Tests performed today include:  An x-ray of the affected area - does NOT show any broken bones  Vital signs. See below for your results today.   Medications prescribed:   None  Take any prescribed medications only as directed.  Home care instructions:   Follow any educational materials contained in this packet  Follow R.I.C.E. Protocol:  R - rest your injury   I  - use ice on injury without applying directly to skin  C - compress injury with bandage or splint  E - elevate the injury as much as possible  Follow-up instructions: Please follow-up with your primary care provider if you continue to have significant pain in 1 week. In this case you may have a more severe injury that requires further care.   Return instructions:   Please return if your toes or feet are numb or tingling, appear gray or blue, or you have severe pain (also elevate the leg and loosen splint or wrap if you were given one)  Please return to the Emergency Department if you experience worsening symptoms.   Please return if you have any other emergent concerns.  Additional Information:  Your vital signs today were: BP 102/70 mmHg   Pulse 79   Temp(Src) 98.7 F (37.1 C) (Oral)   Resp 20   SpO2 100%   LMP 10/01/2014 If your blood pressure (BP) was elevated above 135/85 this visit, please have this repeated by your doctor within one month.

## 2014-10-15 NOTE — ED Provider Notes (Signed)
CSN: [id number]     Arrival date & time 10/15/14  1553 History  This chart was scribed for non-physician practitioner, Rhea Bleacher, PA-C, working with Toy Cookey, MD, by Bronson Curb, ED Scribe. This patient was seen in room TR07C/TR07C and the patient's care was started at 4:43 PM.   Chief Complaint  Patient presents with  . Knee Injury    The history is provided by the patient. No language interpreter was used.     HPI Comments: [name] is a 44 y.o. 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 Patient ambulated with a walker and states it became caught on a pair of shorts when she was ambulating to the bathroom. She states she subsequently landed on her right knee. There is associated sudden onset, constant, 9/10 right knee pain. Patient states she is a hospice patient and was informed to have imaging of the knee. She denies head injury, LOC, or any other injuries.   Past Medical History  Diagnosis Date  . Arthritis   . COPD (chronic obstructive pulmonary disease)   . Respiratory failure, acute   . Diabetes mellitus     borderline  . Hx of toe surgery     X 2  . Ankle fracture     LEFT  . On supplemental oxygen therapy     2 L continuous  . Friedreich's ataxia   . GERD (gastroesophageal reflux disease)   . Asthma    Past Surgical History  Procedure Laterality Date  . Cholecystectomy    . Knee arthroscopy      bilateral  . Toe surgery      x2  . Orif ankle fracture  04/23/2012    Procedure: OPEN REDUCTION INTERNAL FIXATION (ORIF) ANKLE FRACTURE;  Surgeon: Javier Docker, MD;  Location: [facility];  Service: Orthopedics;  Laterality: Left;  . Hardware removal Left 06/16/2012    Procedure: HARDWARE REMOVAL;  Surgeon: Toni Arthurs, MD;  Location: [facility];  Service: Orthopedics;  Laterality: Left;  left ankle  . Incision and drainage Left 06/16/2012    Procedure:  INCISION AND DRAINAGE;  Surgeon: Toni Arthurs, MD;  Location: [facility];  Service: Orthopedics;  Laterality: Left;  . Application of wound vac Left 06/16/2012    Procedure: APPLICATION OF WOUND VAC;  Surgeon: Toni Arthurs, MD;  Location: [facility];  Service: Orthopedics;  Laterality: Left;   Family History  Problem Relation Age of Onset  . Diabetes    . Asthma    . Seizures Mother   . Cancer - Colon Mother   . Emphysema Father   . Diabetes Father    History  Substance Use Topics  . Smoking status: Former Smoker -- 0.50 packs/day for 10 years    Types: Cigarettes    Quit date: 01/30/2010  . Smokeless tobacco: Never Used  . Alcohol Use: No   OB History    No data available     Review of Systems  Constitutional: Positive for activity change.  Musculoskeletal: Positive for arthralgias and gait problem. Negative for back pain, joint swelling and neck pain.  Skin: Negative for wound.  Neurological: Negative for syncope, weakness, numbness and headaches.      Allergies  Review of patient's allergies indicates no known allergies.  Home Medications   Prior to Admission medications   Medication Sig Start Date End Date Taking? Authorizing Provider  famotidine (PEPCID)  20 MG tablet Take 1 tablet (20 mg total) by mouth daily. 08/03/14   Alison Murray, MD  guaiFENesin (MUCINEX) 600 MG 12 hr tablet Take 1 tablet (600 mg total) by mouth 2 (two) times daily as needed for cough or to loosen phlegm. 08/03/14   Alison Murray, MD  ipratropium (ATROVENT) 0.02 % nebulizer solution PLACE 1 VIAL IN NEBULIZER 3 TIMES A DAY 10/07/14   Oretha Milch, MD  morphine (ROXANOL) 20 MG/ML concentrated solution Take 0.25 mLs (5 mg total) by mouth every 6 (six) hours as needed for severe pain. 09/15/14   Kalman Shan, MD   Triage Vitals: BP 102/70 mmHg  Pulse 79  Temp(Src) 98.7 F (37.1 C) (Oral)  Resp 20  SpO2 100%  Physical Exam  Constitutional: She appears well-developed. She appears cachectic.  HENT:   Head: Normocephalic and atraumatic.  Eyes: Pupils are equal, round, and reactive to light.  Neck: Normal range of motion. Neck supple.  Cardiovascular: Exam reveals no decreased pulses.   Pulses:      Dorsalis pedis pulses are 2+ on the right side.       Posterior tibial pulses are 2+ on the right side.  Musculoskeletal: She exhibits tenderness. She exhibits no edema.       Right hip: Normal.       Right knee: She exhibits normal range of motion, no swelling and no effusion. Tenderness found. Medial joint line and lateral joint line tenderness noted.       Right ankle: Normal.       Right upper leg: Normal.       Right lower leg: Normal.       Right foot: Normal.  Neurological: She is alert. No sensory deficit.  Motor, sensation, and vascular distal to the injury is fully intact.   Skin: Skin is warm and dry.  Psychiatric: She has a normal mood and affect.  Nursing note and vitals reviewed.   ED Course  Procedures (including critical care time)  DIAGNOSTIC STUDIES: Oxygen Saturation is 100% on room air, normal by my interpretation.     Labs Review Labs Reviewed - No data to display  Imaging Review Dg Knee Complete 4 Views Right  10/15/2014   CLINICAL DATA:  [name] injuring knee on Monday night, pain and weakness with walking, history diabetes  EXAM: RIGHT KNEE - COMPLETE 4+ VIEW  COMPARISON:  12/01/2011  FINDINGS: Osseous demineralization.  Mild joint space narrowing.  No acute fracture, dislocation or bone destruction.  No knee joint effusion.  IMPRESSION: Osseous demineralization with minimal degenerative changes.  No acute abnormalities.   Electronically Signed   By: Ulyses Southward M.D.   On: 10/15/2014 16:37     EKG Interpretation None      4:56 PM Patient seen and examined. Informed of x-ray results. Will provide with Ace wrap.   Vital signs reviewed and are as follows: BP 102/70 mmHg  Pulse 79  Temp(Src) 98.7 F (37.1 C) (Oral)  Resp 20  SpO2 100%  LMP  10/01/2014  Encouraged rice protocol and PCP follow-up. Patient takes pain medication at home per hospice.  MDM   Final diagnoses:  Right knee pain   Right knee pain after fall, x-rays negative, patient is able to ambulate with her walker. Lower extremity is neurovascularly intact.  I personally performed the services described in this documentation, which was scribed in my presence. The recorded information has been reviewed and is accurate.    Renne Crigler, PA-C  10/15/14 1656  Toy Cookey, MD 10/16/14 1610

## 2014-10-15 NOTE — ED Notes (Signed)
Pt in stating she fell Monday night and injured her right knee, no swelling or deformity noted, here for xray

## 2014-10-25 ENCOUNTER — Other Ambulatory Visit: Payer: Self-pay | Admitting: Pulmonary Disease

## 2014-10-31 ENCOUNTER — Ambulatory Visit: Payer: Medicaid Other | Admitting: Pulmonary Disease

## 2014-11-04 ENCOUNTER — Encounter: Payer: Self-pay | Admitting: Pulmonary Disease

## 2014-11-04 ENCOUNTER — Ambulatory Visit (INDEPENDENT_AMBULATORY_CARE_PROVIDER_SITE_OTHER): Payer: Medicaid Other | Admitting: Pulmonary Disease

## 2014-11-04 VITALS — BP 120/72 | HR 100 | Ht 60.0 in | Wt 74.4 lb

## 2014-11-04 DIAGNOSIS — J9611 Chronic respiratory failure with hypoxia: Secondary | ICD-10-CM | POA: Diagnosis not present

## 2014-11-04 NOTE — Assessment & Plan Note (Signed)
Stay on 4L oxygen Use albuterol nebs as needed Ok to take morphine for shortness of breath Hospice care- DNR

## 2014-11-04 NOTE — Progress Notes (Signed)
   Subjective:    Patient ID: Quillian Quinceheresa L Nation, female    DOB: 1971-03-04, 44 y.o.   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 Follow-up    Hospice nurse coming to her house on Thursday, 11/06/2014; Oxygen has been increased to 4L by Hospice. no complaints.       Remains wheelchair bound for outdoors, uses walker at home, Ataxia is worse  Accompanied by BF, hospice notes reviewed Continues on oxygen therapy 24/7 -increased to 4 L.  Compliant with albuterol nebs C/o dysphagia, regardless of consistency She appears o have lost mre wt - 74 lbs   Significant tests/ events  Admitted 07/19/2009- 07/28/2009 w/ COPD exacerbation noted to have hypercarbic RF on admission required intubation. Alpha 1-antitrypsin was >301. Discharged on O2  PFTs 4/11 FEV1 0.84 (34%) She has smoked since age 44. Father had severe COPD.    ankle fx 04/2012 requiring surgery -ORIF w/ hardware/scres implant . Unfortunately got a MRSA infection . Had surgery 06/2012 for removal of implant. Required prolonged abx.  07/2014 adm for COPD exacerbation and pneumonia  CT chest showed bilateral moderate pleural effusions, Left lower lobe consolidation and atelectasis in the right base. Several nodular densities deep to the left nipple. Echo showed an EF of 60-65%..     Review of Systems     Objective:   Physical Exam        Assessment & Plan:

## 2014-11-04 NOTE — Patient Instructions (Signed)
Stay on 4L oxygen Use albuterol nebs as needed Ok to take morphine for shortness of breath

## 2014-11-12 ENCOUNTER — Telehealth: Payer: Self-pay

## 2014-11-12 ENCOUNTER — Ambulatory Visit: Payer: Medicaid Other | Admitting: Adult Health

## 2014-11-12 NOTE — Telephone Encounter (Signed)
Patient no showed for a revisit appointment.  

## 2014-11-13 ENCOUNTER — Encounter: Payer: Self-pay | Admitting: Adult Health

## 2015-03-09 ENCOUNTER — Ambulatory Visit: Payer: Medicaid Other | Admitting: Adult Health

## 2015-04-10 ENCOUNTER — Telehealth: Payer: Self-pay | Admitting: Pulmonary Disease

## 2015-04-10 NOTE — Telephone Encounter (Signed)
[facility] calling to give a status update on pt: Pt went to dentist today, has a bad oral infection-has been put on amox 800mg  X7 days.  Having to schedule a procedure to have all bottom teeth and top teeth removed (2 separate procedures).  Pt does take morphine 5mg  q1h prn sob-has been on med list for quite some time.  If RA has further questions he can contact Reita MayMichelle Pendleton-RN case manager [phone]  RA fyi.  Thanks!

## 2015-06-09 ENCOUNTER — Telehealth: Payer: Self-pay | Admitting: Pulmonary Disease

## 2015-06-09 NOTE — Telephone Encounter (Signed)
Spoke with pt. She was calling as an FYI that her dentist Dr. Barbette Merino is going to be calling Dr. Vassie Loll about her. She reports she needs to get her teeth pulled out but will need to be done at [facility] under general anesthesia. I do not see a call yet from Dr. Barbette Merino. Will forward to Dr. Vassie Loll so he is aware.

## 2015-06-11 ENCOUNTER — Telehealth: Payer: Self-pay | Admitting: Pulmonary Disease

## 2015-06-11 MED ORDER — LEVOFLOXACIN 500 MG PO TABS: 500.0000 mg | ORAL_TABLET | Freq: Every day | ORAL | Status: AC

## 2015-06-11 NOTE — Telephone Encounter (Signed)
Spoke with Marcelino Duster (hospice nurse). Had visit with pt. Pt O2 today was 86-88% on RA. Pt placed on 3.5 L now and is 89-90%. Pt is now coughing up green phlem, wheezing expiratory and inspiratory. Wants an ABX called into [facility] for pt. Please advise RA thanks

## 2015-06-11 NOTE — Telephone Encounter (Signed)
Levaquin 500 daily for 7 days Prednisone 10 mg tabs  Take 2 tabs daily with food x 5ds, then 1 tab daily with food x 5ds then STOP

## 2015-06-11 NOTE — Telephone Encounter (Signed)
Spoke with [name]. She is aware of recs below. RX for abx sent in. Pt already takes pred 50 mg daily now so no need for pred RX.  FYI to RA

## 2015-06-16 ENCOUNTER — Telehealth: Payer: Self-pay | Admitting: Pulmonary Disease

## 2015-06-16 NOTE — Telephone Encounter (Signed)
Attempted to contact Dr. Randa Evens office, office closed, will call back in the morning.

## 2015-06-16 NOTE — Telephone Encounter (Signed)
Cleared with due risk for general anesthesia Pl ensure their office knows that this pt is under hospice care

## 2015-06-16 NOTE — Telephone Encounter (Signed)
Dr. Vassie Loll, will pt need to have an OV for surgical clearance. On 06/09/15 pt had called about Dr. Barbette Merino doing oral surgery under genal anesthesia over at [facility]. Please advise thanks

## 2015-06-17 ENCOUNTER — Encounter: Payer: Self-pay | Admitting: *Deleted

## 2015-06-17 NOTE — Telephone Encounter (Signed)
Spoke with [employer]. She is aware that pt is cleared for surgery. A letter has been faxed to [phone] stating this. Nothing further was needed at this time.

## 2015-06-22 ENCOUNTER — Telehealth: Payer: Self-pay | Admitting: Pulmonary Disease

## 2015-06-22 NOTE — Telephone Encounter (Signed)
I called # provided and was advised they have many [employer]. I was then transferred to Toniann Fail (the patient coordination director's VM) and LMTCB x1

## 2015-06-22 NOTE — Telephone Encounter (Signed)
Called wendy and LMTCB x1

## 2015-06-22 NOTE — Telephone Encounter (Signed)
Return call from  Toniann Fail she can be reached @ [phone].Caren Griffins

## 2015-06-22 NOTE — Telephone Encounter (Signed)
Called spoke with Toniann Fail. She is requesting VO for pt to be able to move into the next hospice benefit period. This will start 07/06/15. Please advise RA thanks

## 2015-06-22 NOTE — Telephone Encounter (Signed)
Return call again.Caren Griffins'

## 2015-06-23 NOTE — Telephone Encounter (Signed)
lmomtcb for [name]

## 2015-06-23 NOTE — Telephone Encounter (Signed)
OK 

## 2015-06-24 NOTE — Telephone Encounter (Signed)
I have left the verbal order on [facility] voicemail. Nothing further was needed.

## 2015-07-15 ENCOUNTER — Inpatient Hospital Stay (HOSPITAL_COMMUNITY)
Admission: RE | Admit: 2015-07-15 | Discharge: 2015-07-15 | Disposition: A | Discharge status: Home / Self Care | Payer: Medicaid Other | Source: Ambulatory Visit

## 2015-07-15 ENCOUNTER — Encounter (HOSPITAL_COMMUNITY): Payer: Self-pay | Admitting: *Deleted

## 2015-07-15 NOTE — Progress Notes (Signed)
Pt SDW-Pre-op call completed by pt and Baldo Asharl, significant other with pt consent. Pt denies any acute cardiopulmonary issues. Pt denies being under the care of a cardiologist. Pt denies having a stress test and cardiac cath. Pt stated that she must take Prednisone with food; please consider IV Prednisone on DOS. Pt made aware to stop taking Aspirin, vitamins, fish oil and herbal medications. Do not take any NSAIDs ie: Ibuprofen, Advil, Naproxen, BC and Goody Powder or any medication containing Aspirin. Pt stated that she does not check her BS when attempting to advise pt of diabetes protocol; pt stated that she doesn't have her own glucometer . Both pt and Baldo AshCarl verbalized understanding of pre-op instructions.

## 2015-07-15 NOTE — H&P (Signed)
HISTORY AND PHYSICAL  [name] is a 45 y.o. female patient with CC: painful teeth  No diagnosis found.  Past Medical History  Diagnosis Date  . Arthritis   . COPD (chronic obstructive pulmonary disease)   . Respiratory failure, acute   . Diabetes mellitus     borderline  . Hx of toe surgery     X 2  . Ankle fracture     LEFT  . On supplemental oxygen therapy     2 L continuous  . Friedreich's ataxia   . GERD (gastroesophageal reflux disease)   . Asthma     No current facility-administered medications for this encounter.   Current Outpatient Prescriptions  Medication Sig Dispense Refill  . albuterol (PROVENTIL) (2.5 MG/3ML) 0.083% nebulizer solution Take 2.5 mg by nebulization every 6 (six) hours.   3  . [facility] NON-ASPIRIN EXTRA STRENGTH 500 MG tablet TAKE 1 TABLET BY MOUTH EVERY 4 HOURS AS NEEDED FOR PAIN AND FEVER  0  . famotidine (PEPCID) 20 MG tablet Take 1 tablet (20 mg total) by mouth daily. (Patient taking differently: Take 20 mg by mouth daily as needed for heartburn or indigestion. ) 10 tablet 0  . furosemide (LASIX) 20 MG tablet TAKE 1 TABLET BY MOUTH EVERY DAY AS NEEDED FOR LEG SWELLING  2  . ibuprofen (ADVIL,MOTRIN) 800 MG tablet Take 800 mg by mouth every 6 (six) hours as needed for moderate pain.   0  . ipratropium (ATROVENT) 0.02 % nebulizer solution PLACE 1 VIAL IN NEBULIZER 3 TIMES A DAY (Patient taking differently: PLACE 1 VIAL IN NEBULIZER every 6 hours) 225 mL 5  . morphine (ROXANOL) 20 MG/ML concentrated solution Take 0.25 mLs (5 mg total) by mouth every 6 (six) hours as needed for severe pain. 30 mL 0  . predniSONE (DELTASONE) 50 MG tablet Take 50 mg by mouth every morning.   2  . PROAIR HFA 108 (90 Base) MCG/ACT inhaler Inhale 2 puffs into the lungs every 4 (four) hours as needed for wheezing or shortness of breath.   3  . prochlorperazine (COMPAZINE) 5 MG tablet Take 5-10 mg by mouth every 6 (six) hours as needed for nausea or vomiting.   3  .  promethazine (PHENERGAN) 50 MG tablet Take 50 mg by mouth every 8 (eight) hours as needed for nausea or vomiting.    . SYMBICORT 160-4.5 MCG/ACT inhaler Inhale 2 puffs into the lungs 2 (two) times daily.   3  . guaiFENesin (MUCINEX) 600 MG 12 hr tablet Take 1 tablet (600 mg total) by mouth 2 (two) times daily as needed for cough or to loosen phlegm. (Patient not taking: Reported on 07/14/2015) 45 tablet 0  . levofloxacin (LEVAQUIN) 500 MG tablet Take 1 tablet (500 mg total) by mouth daily. (Patient not taking: Reported on 07/14/2015) 7 tablet 0   No Known Allergies Active Problems:   * No active hospital problems. *  Vitals: There were no vitals taken for this visit. Lab results:No results found for this or any previous visit (from the past 24 hour(s)). Radiology Results: No results found. General appearance: alert, cooperative, cachectic and no distress Head: Normocephalic, without obvious abnormality, atraumatic Eyes: negative Nose: Nares normal. Septum midline. Mucosa normal. No drainage or sinus tenderness. Throat: multiple carious teeth, Poor oral hygiene, pharynx clear Neck: no adenopathy, supple, symmetrical, trachea midline and thyroid not enlarged, symmetric, no tenderness/mass/nodules Resp: wheezes anterior - right, apex - right and posterior - right Cardio: regular rate  and rhythm, S1, S2 normal, no murmur, click, rub or gallop  Assessment:All teeth nonrestorable secondary to dental caries and periodontitis.  Plan: Full mouth extractions with alveoloplasty. General anestheisa. Day surgery.   [name] 07/15/2015

## 2015-07-17 ENCOUNTER — Encounter (HOSPITAL_COMMUNITY)
Admission: RE | Disposition: A | Discharge status: Home / Self Care | Payer: Self-pay | Source: Ambulatory Visit | Attending: Emergency Medicine

## 2015-07-17 ENCOUNTER — Ambulatory Visit (HOSPITAL_COMMUNITY): Payer: Medicaid Other | Admitting: Anesthesiology

## 2015-07-17 ENCOUNTER — Encounter (HOSPITAL_COMMUNITY): Payer: Self-pay | Admitting: Anesthesiology

## 2015-07-17 ENCOUNTER — Inpatient Hospital Stay (HOSPITAL_COMMUNITY)
Admission: RE | Admit: 2015-07-17 | Discharge: 2015-07-19 | DRG: 982 | Disposition: A | Discharge status: Home / Self Care | Payer: Medicaid Other | Source: Ambulatory Visit | Attending: Emergency Medicine | Admitting: Emergency Medicine

## 2015-07-17 ENCOUNTER — Inpatient Hospital Stay (HOSPITAL_COMMUNITY): Payer: Medicaid Other

## 2015-07-17 DIAGNOSIS — Z87891 Personal history of nicotine dependence: Secondary | ICD-10-CM | POA: Diagnosis not present

## 2015-07-17 DIAGNOSIS — G111 Early-onset cerebellar ataxia: Secondary | ICD-10-CM | POA: Diagnosis present

## 2015-07-17 DIAGNOSIS — Z9981 Dependence on supplemental oxygen: Secondary | ICD-10-CM

## 2015-07-17 DIAGNOSIS — Z7952 Long term (current) use of systemic steroids: Secondary | ICD-10-CM | POA: Diagnosis not present

## 2015-07-17 DIAGNOSIS — J9621 Acute and chronic respiratory failure with hypoxia: Secondary | ICD-10-CM | POA: Diagnosis present

## 2015-07-17 DIAGNOSIS — J9601 Acute respiratory failure with hypoxia: Secondary | ICD-10-CM | POA: Diagnosis not present

## 2015-07-17 DIAGNOSIS — J449 Chronic obstructive pulmonary disease, unspecified: Secondary | ICD-10-CM | POA: Diagnosis not present

## 2015-07-17 DIAGNOSIS — J441 Chronic obstructive pulmonary disease with (acute) exacerbation: Secondary | ICD-10-CM | POA: Diagnosis present

## 2015-07-17 DIAGNOSIS — K0530 Chronic periodontitis, unspecified: Secondary | ICD-10-CM | POA: Diagnosis present

## 2015-07-17 DIAGNOSIS — K029 Dental caries, unspecified: Secondary | ICD-10-CM | POA: Diagnosis present

## 2015-07-17 DIAGNOSIS — Z01818 Encounter for other preprocedural examination: Secondary | ICD-10-CM

## 2015-07-17 DIAGNOSIS — R011 Cardiac murmur, unspecified: Secondary | ICD-10-CM | POA: Diagnosis present

## 2015-07-17 DIAGNOSIS — Z79899 Other long term (current) drug therapy: Secondary | ICD-10-CM | POA: Diagnosis not present

## 2015-07-17 DIAGNOSIS — M1990 Unspecified osteoarthritis, unspecified site: Secondary | ICD-10-CM | POA: Diagnosis present

## 2015-07-17 DIAGNOSIS — K219 Gastro-esophageal reflux disease without esophagitis: Secondary | ICD-10-CM | POA: Diagnosis present

## 2015-07-17 DIAGNOSIS — J9600 Acute respiratory failure, unspecified whether with hypoxia or hypercapnia: Secondary | ICD-10-CM | POA: Diagnosis present

## 2015-07-17 DIAGNOSIS — E119 Type 2 diabetes mellitus without complications: Secondary | ICD-10-CM | POA: Diagnosis present

## 2015-07-17 DIAGNOSIS — Z66 Do not resuscitate: Secondary | ICD-10-CM | POA: Diagnosis present

## 2015-07-17 HISTORY — DX: Cardiac murmur, unspecified: R01.1

## 2015-07-17 HISTORY — DX: Pneumonia, unspecified organism: J18.9

## 2015-07-17 HISTORY — PX: MULTIPLE EXTRACTIONS WITH ALVEOLOPLASTY: SHX5342

## 2015-07-17 HISTORY — DX: Calculus of kidney: N20.0

## 2015-07-17 LAB — CBC
HEMATOCRIT: 45.1 % (ref 36.0–46.0)
HEMATOCRIT: 35.5 % — AB (ref 36.0–46.0)
Hemoglobin: 15.3 g/dL — ABNORMAL HIGH (ref 12.0–15.0)
Hemoglobin: 11.8 g/dL — ABNORMAL LOW (ref 12.0–15.0)
MCH: 34.2 pg — ABNORMAL HIGH (ref 26.0–34.0)
MCH: 33.2 pg (ref 26.0–34.0)
MCHC: 33.9 g/dL (ref 30.0–36.0)
MCHC: 33.2 g/dL (ref 30.0–36.0)
MCV: 100.7 fL — AB (ref 78.0–100.0)
MCV: 100 fL (ref 78.0–100.0)
Platelets: 70 10*3/uL — ABNORMAL LOW (ref 150–400)
Platelets: 79 10*3/uL — ABNORMAL LOW (ref 150–400)
RBC: 4.48 MIL/uL (ref 3.87–5.11)
RBC: 3.55 MIL/uL — ABNORMAL LOW (ref 3.87–5.11)
RDW: 16.9 % — ABNORMAL HIGH (ref 11.5–15.5)
RDW: 16.7 % — AB (ref 11.5–15.5)
WBC: 6.9 10*3/uL (ref 4.0–10.5)
WBC: 6.1 10*3/uL (ref 4.0–10.5)

## 2015-07-17 LAB — GLUCOSE, CAPILLARY
GLUCOSE-CAPILLARY: 251 mg/dL — AB (ref 65–99)
GLUCOSE-CAPILLARY: 46 mg/dL — AB (ref 65–99)
Glucose-Capillary: 211 mg/dL — ABNORMAL HIGH (ref 65–99)
Glucose-Capillary: 126 mg/dL — ABNORMAL HIGH (ref 65–99)
Glucose-Capillary: 109 mg/dL — ABNORMAL HIGH (ref 65–99)

## 2015-07-17 LAB — SURGICAL PCR SCREEN
MRSA, PCR: NEGATIVE
STAPHYLOCOCCUS AUREUS: NEGATIVE

## 2015-07-17 LAB — BLOOD GAS, ARTERIAL
Acid-Base Excess: 12.9 mmol/L — ABNORMAL HIGH (ref 0.0–2.0)
BICARBONATE: 43.1 meq/L — AB (ref 20.0–24.0)
Drawn by: 280981
Expiratory PAP: 8
FIO2: 1
INSPIRATORY PAP: 16
LHR: 18 {breaths}/min
O2 Saturation: 97.9 %
PATIENT TEMPERATURE: 98.6
TCO2: 47.8 mmol/L (ref 0–100)
pH, Arterial: 7.075 — CL (ref 7.350–7.450)
pO2, Arterial: 159 mmHg — ABNORMAL HIGH (ref 80.0–100.0)

## 2015-07-17 LAB — BASIC METABOLIC PANEL
ANION GAP: 14 (ref 5–15)
Anion gap: 12 (ref 5–15)
BUN: <5 mg/dL — ABNORMAL LOW (ref 6–20)
BUN: <5 mg/dL — ABNORMAL LOW (ref 6–20)
CALCIUM: 7.7 mg/dL — AB (ref 8.9–10.3)
CO2: 40 mmol/L — AB (ref 22–32)
CO2: 35 mmol/L — AB (ref 22–32)
CREATININE: 0.45 mg/dL (ref 0.44–1.00)
Calcium: 8.5 mg/dL — ABNORMAL LOW (ref 8.9–10.3)
Chloride: 83 mmol/L — ABNORMAL LOW (ref 101–111)
Chloride: 90 mmol/L — ABNORMAL LOW (ref 101–111)
Creatinine, Ser: 0.4 mg/dL — ABNORMAL LOW (ref 0.44–1.00)
GFR calc Af Amer: >60 mL/min (ref >60)
GFR calc non Af Amer: >60 mL/min (ref >60)
GLUCOSE: 107 mg/dL — AB (ref 65–99)
Glucose, Bld: 36 mg/dL — CL (ref 65–99)
POTASSIUM: 3.9 mmol/L (ref 3.5–5.1)
Potassium: 3.7 mmol/L (ref 3.5–5.1)
SODIUM: 137 mmol/L (ref 135–145)
Sodium: 137 mmol/L (ref 135–145)

## 2015-07-17 LAB — PHOSPHORUS: PHOSPHORUS: 1.4 mg/dL — AB (ref 2.5–4.6)

## 2015-07-17 LAB — MRSA PCR SCREENING: MRSA by PCR: NEGATIVE

## 2015-07-17 LAB — HCG, SERUM, QUALITATIVE: Preg, Serum: NEGATIVE

## 2015-07-17 LAB — MAGNESIUM: Magnesium: 1.3 mg/dL — ABNORMAL LOW (ref 1.7–2.4)

## 2015-07-17 SURGERY — MULTIPLE EXTRACTION WITH ALVEOLOPLASTY
Anesthesia: General | Site: Mouth | Laterality: Bilateral

## 2015-07-17 MED ORDER — ALBUMIN HUMAN 5 % IV SOLN
25.0000 g | Freq: Once | INTRAVENOUS | Status: AC
  Administered 2015-07-17: 12.5 g via INTRAVENOUS

## 2015-07-17 MED ORDER — ONDANSETRON HCL 4 MG/2ML IJ SOLN: 4.0000 mg | Freq: Four times a day (QID) | INTRAMUSCULAR | Status: DC | PRN

## 2015-07-17 MED ORDER — SUCCINYLCHOLINE CHLORIDE 20 MG/ML IJ SOLN
INTRAMUSCULAR | Status: DC | PRN
  Administered 2015-07-17: 100 mg via INTRAVENOUS

## 2015-07-17 MED ORDER — OXYMETAZOLINE HCL 0.05 % NA SOLN
NASAL | Status: AC
  Filled 2015-07-17: qty 15

## 2015-07-17 MED ORDER — LACTATED RINGERS IV SOLN
INTRAVENOUS | Status: DC
  Administered 2015-07-17: 07:00:00 via INTRAVENOUS

## 2015-07-17 MED ORDER — NALOXONE HCL 0.4 MG/ML IJ SOLN
INTRAMUSCULAR | Status: AC
  Filled 2015-07-17: qty 1

## 2015-07-17 MED ORDER — IPRATROPIUM-ALBUTEROL 0.5-2.5 (3) MG/3ML IN SOLN
3.0000 mL | Freq: Four times a day (QID) | RESPIRATORY_TRACT | Status: DC
  Administered 2015-07-17 – 2015-07-19 (×6): 3 mL via RESPIRATORY_TRACT
  Filled 2015-07-17 (×6): qty 3

## 2015-07-17 MED ORDER — INSULIN ASPART 100 UNIT/ML ~~LOC~~ SOLN
6.0000 [IU] | Freq: Once | SUBCUTANEOUS | Status: AC
  Administered 2015-07-17: 6 [IU] via SUBCUTANEOUS

## 2015-07-17 MED ORDER — PROPOFOL 10 MG/ML IV BOLUS
INTRAVENOUS | Status: AC
  Filled 2015-07-17: qty 20

## 2015-07-17 MED ORDER — PHENYLEPHRINE HCL 10 MG/ML IJ SOLN
INTRAMUSCULAR | Status: DC | PRN
  Administered 2015-07-17: 40 ug via INTRAVENOUS

## 2015-07-17 MED ORDER — SODIUM CHLORIDE 0.9 % IV SOLN
INTRAVENOUS | Status: DC
Start: 2015-07-17 — End: 2015-07-19
  Administered 2015-07-17: 19:00:00 via INTRAVENOUS

## 2015-07-17 MED ORDER — MIDAZOLAM HCL 2 MG/2ML IJ SOLN: 2.0000 mg | INTRAMUSCULAR | Status: DC | PRN

## 2015-07-17 MED ORDER — OXYCODONE HCL 5 MG PO TABS: 5.0000 mg | ORAL_TABLET | Freq: Once | ORAL | Status: DC | PRN

## 2015-07-17 MED ORDER — LIDOCAINE-EPINEPHRINE 2 %-1:100000 IJ SOLN
INTRAMUSCULAR | Status: DC | PRN
  Administered 2015-07-17: 16 mL

## 2015-07-17 MED ORDER — DEXTROSE 50 % IV SOLN
INTRAVENOUS | Status: AC
  Administered 2015-07-17: 25 mL
  Filled 2015-07-17: qty 50

## 2015-07-17 MED ORDER — ALBUTEROL SULFATE (2.5 MG/3ML) 0.083% IN NEBU
INHALATION_SOLUTION | RESPIRATORY_TRACT | Status: AC
  Filled 2015-07-17: qty 3

## 2015-07-17 MED ORDER — OXYCODONE HCL 5 MG/5ML PO SOLN: 5.0000 mg | Freq: Once | ORAL | Status: DC | PRN

## 2015-07-17 MED ORDER — LIDOCAINE-EPINEPHRINE 2 %-1:100000 IJ SOLN
INTRAMUSCULAR | Status: AC
  Filled 2015-07-17: qty 1

## 2015-07-17 MED ORDER — ALBUMIN HUMAN 5 % IV SOLN
INTRAVENOUS | Status: AC
  Administered 2015-07-17: 12.5 g
  Filled 2015-07-17: qty 500

## 2015-07-17 MED ORDER — FENTANYL CITRATE (PF) 100 MCG/2ML IJ SOLN: 100.0000 ug | INTRAMUSCULAR | Status: DC | PRN

## 2015-07-17 MED ORDER — MIDAZOLAM HCL 2 MG/2ML IJ SOLN: 4.0000 mg | Freq: Once | INTRAMUSCULAR | Status: AC

## 2015-07-17 MED ORDER — PANTOPRAZOLE SODIUM 40 MG IV SOLR
40.0000 mg | Freq: Every day | INTRAVENOUS | Status: DC
  Administered 2015-07-17: 40 mg via INTRAVENOUS
  Filled 2015-07-17: qty 40

## 2015-07-17 MED ORDER — SODIUM CHLORIDE 0.9 % IJ SOLN
INTRAMUSCULAR | Status: AC
  Filled 2015-07-17: qty 10

## 2015-07-17 MED ORDER — FENTANYL CITRATE (PF) 100 MCG/2ML IJ SOLN
100.0000 ug | INTRAMUSCULAR | Status: DC | PRN
  Administered 2015-07-17: 100 ug via INTRAVENOUS

## 2015-07-17 MED ORDER — ONDANSETRON HCL 4 MG/2ML IJ SOLN
INTRAMUSCULAR | Status: DC | PRN
  Administered 2015-07-17: 4 mg via INTRAVENOUS

## 2015-07-17 MED ORDER — FENTANYL CITRATE (PF) 100 MCG/2ML IJ SOLN
INTRAMUSCULAR | Status: AC
  Filled 2015-07-17: qty 2

## 2015-07-17 MED ORDER — ALBUTEROL SULFATE (2.5 MG/3ML) 0.083% IN NEBU: 2.5000 mg | INHALATION_SOLUTION | RESPIRATORY_TRACT | Status: DC | PRN

## 2015-07-17 MED ORDER — OXYCODONE-ACETAMINOPHEN 5-325 MG PO TABS: 1.0000 | ORAL_TABLET | ORAL | Status: AC | PRN

## 2015-07-17 MED ORDER — PROPOFOL 10 MG/ML IV BOLUS
INTRAVENOUS | Status: DC | PRN
  Administered 2015-07-17 (×2): 30 mg via INTRAVENOUS
  Administered 2015-07-17: 140 mg via INTRAVENOUS

## 2015-07-17 MED ORDER — LIDOCAINE HCL (CARDIAC) 20 MG/ML IV SOLN
INTRAVENOUS | Status: DC | PRN
  Administered 2015-07-17: 50 mg via INTRAVENOUS

## 2015-07-17 MED ORDER — METHYLPREDNISOLONE SODIUM SUCC 125 MG IJ SOLR
60.0000 mg | Freq: Four times a day (QID) | INTRAMUSCULAR | Status: DC
  Administered 2015-07-17 – 2015-07-19 (×7): 60 mg via INTRAVENOUS
  Filled 2015-07-17: qty 0.96
  Filled 2015-07-17: qty 2
  Filled 2015-07-17 (×4): qty 0.96
  Filled 2015-07-17: qty 2
  Filled 2015-07-17 (×2): qty 0.96

## 2015-07-17 MED ORDER — MIDAZOLAM HCL 2 MG/2ML IJ SOLN
INTRAMUSCULAR | Status: AC
  Filled 2015-07-17: qty 2

## 2015-07-17 MED ORDER — ANTISEPTIC ORAL RINSE SOLUTION (CORINZ)
7.0000 mL | OROMUCOSAL | Status: DC
  Administered 2015-07-17 – 2015-07-18 (×2): 7 mL via OROMUCOSAL

## 2015-07-17 MED ORDER — MUPIROCIN 2 % EX OINT
TOPICAL_OINTMENT | CUTANEOUS | Status: AC
  Administered 2015-07-17: 1
  Filled 2015-07-17: qty 22

## 2015-07-17 MED ORDER — FENTANYL CITRATE (PF) 250 MCG/5ML IJ SOLN
INTRAMUSCULAR | Status: AC
  Filled 2015-07-17: qty 5

## 2015-07-17 MED ORDER — NALOXONE HCL 0.4 MG/ML IJ SOLN
INTRAMUSCULAR | Status: DC | PRN
  Administered 2015-07-17: 20 ug via INTRAVENOUS
  Administered 2015-07-17: 40 ug via INTRAVENOUS

## 2015-07-17 MED ORDER — OXYMETAZOLINE HCL 0.05 % NA SOLN
NASAL | Status: DC | PRN
  Administered 2015-07-17: 1

## 2015-07-17 MED ORDER — LIDOCAINE HCL (CARDIAC) 20 MG/ML IV SOLN
INTRAVENOUS | Status: AC
  Filled 2015-07-17: qty 5

## 2015-07-17 MED ORDER — FENTANYL CITRATE (PF) 100 MCG/2ML IJ SOLN
INTRAMUSCULAR | Status: DC | PRN
  Administered 2015-07-17 (×2): 25 ug via INTRAVENOUS

## 2015-07-17 MED ORDER — DEXTROSE 5 % IV SOLN
250.0000 mg | INTRAVENOUS | Status: DC
  Administered 2015-07-17 – 2015-07-18 (×2): 250 mg via INTRAVENOUS
  Filled 2015-07-17 (×3): qty 250

## 2015-07-17 MED ORDER — INSULIN ASPART 100 UNIT/ML ~~LOC~~ SOLN
SUBCUTANEOUS | Status: AC
  Filled 2015-07-17: qty 6

## 2015-07-17 MED ORDER — DOCUSATE SODIUM 50 MG/5ML PO LIQD
100.0000 mg | Freq: Two times a day (BID) | ORAL | Status: DC | PRN
  Filled 2015-07-17: qty 10

## 2015-07-17 MED ORDER — SUCCINYLCHOLINE CHLORIDE 20 MG/ML IJ SOLN
INTRAMUSCULAR | Status: AC
  Filled 2015-07-17: qty 1

## 2015-07-17 MED ORDER — FENTANYL CITRATE (PF) 100 MCG/2ML IJ SOLN: 25.0000 ug | INTRAMUSCULAR | Status: DC | PRN

## 2015-07-17 MED ORDER — INSULIN ASPART 100 UNIT/ML ~~LOC~~ SOLN
0.0000 [IU] | Freq: Three times a day (TID) | SUBCUTANEOUS | Status: DC
  Administered 2015-07-18: 5 [IU] via SUBCUTANEOUS
  Administered 2015-07-19: 3 [IU] via SUBCUTANEOUS

## 2015-07-17 MED ORDER — CHLORHEXIDINE GLUCONATE 0.12% ORAL RINSE (MEDLINE KIT)
15.0000 mL | Freq: Two times a day (BID) | OROMUCOSAL | Status: DC
  Administered 2015-07-17 – 2015-07-19 (×3): 15 mL via OROMUCOSAL

## 2015-07-17 MED ORDER — SODIUM CHLORIDE 0.9 % IR SOLN
Status: DC | PRN
  Administered 2015-07-17: 1000 mL

## 2015-07-17 MED ORDER — 0.9 % SODIUM CHLORIDE (POUR BTL) OPTIME
TOPICAL | Status: DC | PRN
  Administered 2015-07-17: 1000 mL

## 2015-07-17 MED ORDER — INSULIN ASPART 100 UNIT/ML ~~LOC~~ SOLN: 2.0000 [IU] | SUBCUTANEOUS | Status: DC

## 2015-07-17 SURGICAL SUPPLY — 30 items
BUR CROSS CUT FISSURE 1.6 (BURR) ×2 IMPLANT
BUR CROSS CUT FISSURE 1.6MM (BURR) ×1
BUR EGG ELITE 4.0 (BURR) ×2 IMPLANT
BUR EGG ELITE 4.0MM (BURR) ×1
CANISTER SUCTION 2500CC (MISCELLANEOUS) ×3 IMPLANT
COVER SURGICAL LIGHT HANDLE (MISCELLANEOUS) ×3 IMPLANT
CRADLE DONUT ADULT HEAD (MISCELLANEOUS) ×3 IMPLANT
DRAPE U-SHAPE 76X120 STRL (DRAPES) ×3 IMPLANT
FLUID NSS /IRRIG 1000 ML XXX (MISCELLANEOUS) ×3 IMPLANT
GAUZE PACKING FOLDED 2  STR (GAUZE/BANDAGES/DRESSINGS) ×2
GAUZE PACKING FOLDED 2 STR (GAUZE/BANDAGES/DRESSINGS) ×1 IMPLANT
GLOVE BIO SURGEON STRL SZ 6.5 (GLOVE) ×2 IMPLANT
GLOVE BIO SURGEON STRL SZ7.5 (GLOVE) ×3 IMPLANT
GLOVE BIO SURGEONS STRL SZ 6.5 (GLOVE) ×1
GLOVE BIOGEL PI IND STRL 7.0 (GLOVE) ×1 IMPLANT
GLOVE BIOGEL PI INDICATOR 7.0 (GLOVE) ×2
GOWN STRL REUS W/ TWL LRG LVL3 (GOWN DISPOSABLE) ×1 IMPLANT
GOWN STRL REUS W/ TWL XL LVL3 (GOWN DISPOSABLE) ×1 IMPLANT
GOWN STRL REUS W/TWL LRG LVL3 (GOWN DISPOSABLE) ×2
GOWN STRL REUS W/TWL XL LVL3 (GOWN DISPOSABLE) ×2
KIT BASIN OR (CUSTOM PROCEDURE TRAY) ×3 IMPLANT
KIT ROOM TURNOVER OR (KITS) ×3 IMPLANT
NEEDLE 22X1 1/2 (OR ONLY) (NEEDLE) ×6 IMPLANT
NS IRRIG 1000ML POUR BTL (IV SOLUTION) ×3 IMPLANT
PAD ARMBOARD 7.5X6 YLW CONV (MISCELLANEOUS) ×3 IMPLANT
SUT CHROMIC 3 0 PS 2 (SUTURE) ×6 IMPLANT
SYR CONTROL 10ML LL (SYRINGE) ×6 IMPLANT
TRAY ENT MC OR (CUSTOM PROCEDURE TRAY) ×3 IMPLANT
TUBING IRRIGATION (MISCELLANEOUS) ×3 IMPLANT
YANKAUER SUCT BULB TIP NO VENT (SUCTIONS) ×3 IMPLANT

## 2015-07-17 NOTE — Op Note (Signed)
NAME:  [name], [name]            ACCOUNT NO.:  [id number]  MEDICAL RECORD NO.:  [id number]  LOCATION:  [facility]                         FACILITY:  [facility]  PHYSICIAN:  Georgia LopesScott M. Bobby Barton, M.D.  DATE OF BIRTH:  11/04/1970  DATE OF PROCEDURE:  07/17/2015 DATE OF DISCHARGE:                              OPERATIVE REPORT   PREOPERATIVE DIAGNOSIS:  Nonrestorable teeth #3, 4, 5, 6, 10, 11, 12, 13, 14, 21, 22, 23, 24, 25, 26, 27, 29.  POSTOPERATIVE DIAGNOSIS:  Nonrestorable teeth #3, 4, 5, 6, 10, 11, 12, 13, 14, 21, 22, 23, 24, 25, 26, 27, 29.  PROCEDURE:  Extraction of teeth #3, 4, 5, 6, 10, 11, 12, 13, 14, 21, 22, 23, 24, 25, 26, 27, 29.  Alveoplasty, right and left maxilla and mandible.  SURGEON:  Georgia LopesScott M. Mirta Mally, M.D.  ANESTHESIA:  General, oral intubation, Dr. Achille RichAdam Hodierne, attending.  DESCRIPTION OF PROCEDURE:  The patient was taken to the operating room, placed on the table in supine position.  General anesthesia was administered.  Endotracheal tube was placed and the eyes were protected and the patient was draped for the procedure.  Time-out was performed. The posterior pharynx was suctioned.  The throat pack was placed.  A 2% lidocaine with 1:100,000 epinephrine was infiltrated in an inferior alveolar block on the right and left side and a buccal and palatal infiltration of the maxilla, total of 16 mL was utilized.  A bite block was placed on the right side of the mouth and a sweetheart retractor was used to retract the tongue.  A 15 blade was used to make an incision beginning at tooth #21 and carrying it forward to tooth #26 both buccal and lingually.  Then, the 15 blade was used to make incision around teeth #14, 13, 12, 11, 10 both buccally and palatally.  The periosteum was reflected.  The teeth were elevated with a 301 elevator and removed from the mouth with a dental forceps.  The sockets curetted and then the alveolar crest was exposed by reflecting the buccal  periosteum superiorly.  Alveoplasty was performed using the egg-shaped burr and bone file.  Then, these areas were irrigated and closed with 3-0 chromic.  The sweetheart retractor, endotracheal tube, and bite block were repositioned to the other side of the mouth.  A 15 blade was used to make an incision around teeth #3, 4, 5, 6 in the maxilla and around teeth #27 and 29 in the mandible.  The periosteum was reflected from around these teeth with a periosteal elevator.  The teeth were elevated with a 301 elevator and removed from the mouth with a dental forceps. The sockets were curetted.  Then, the periosteal elevator was used to reflect the periosteum to expose the alveolar crest and alveoplasty was performed using the egg-shaped burr and bone file on both the right maxilla and mandible.  Then, the areas were irrigated and closed with 3- 0 chromic.  The oral cavity was inspected and found to have good contour, hemostasis, and closure.  The oral cavity was irrigated and suctioned.  The throat pack was removed.  The patient remained intubated and in the care of the anesthesiology service.  ESTIMATED  BLOOD LOSS:  Minimal.  COMPLICATIONS:  None.  SPECIMENS:  None.     [name], M.D.     SMJ/MEDQ  D:  07/17/2015  T:  07/17/2015  Job:  [id number]

## 2015-07-17 NOTE — Transfer of Care (Signed)
Immediate Anesthesia Transfer of Care Note  Patient: [name]  Procedure(s) Performed: Procedure(s): BILATERAL MULTIPLE EXTRACTIONS--TEETH THREE, FOUR, FIVE, SIX, TEN, ELEVEN, TWELVE, THIRTEEN, FOURTEEN, TWENTY ONE, TWENTY TWO, TWENTY THREE, TWENTY FOUR, TWENTY FIVE, TWENTY SIX, TWENTY SEVEN, TWENTY NINE--WITH ALVEOLOPLASTY (Bilateral)  Patient Location: PACU  Anesthesia Type:General  Level of Consciousness: unresponsive  Airway & Oxygen Therapy: Patient Spontanous Breathing and erratic resp rate and variable VT so placed on CPAP by RT  Post-op Assessment: Report given to RN and Pt has at times swallowed and moved upper exts.but will not wake up  Post vital signs: Reviewed, stable and unstable  Last Vitals:  Filed Vitals:   07/17/15 0654  BP: 101/69  Pulse: 70  Temp: 36.7 C  Resp: 16    Complications: Awaiting pt's response to CPAP.  Plan reintubation if not successful

## 2015-07-17 NOTE — Anesthesia Postprocedure Evaluation (Signed)
Anesthesia Post Note  Patient: [name]  Procedure(s) Performed: Procedure(s) (LRB): BILATERAL MULTIPLE EXTRACTIONS--TEETH THREE, FOUR, FIVE, SIX, TEN, ELEVEN, TWELVE, THIRTEEN, FOURTEEN, TWENTY ONE, TWENTY TWO, TWENTY THREE, TWENTY FOUR, TWENTY FIVE, TWENTY SIX, TWENTY SEVEN, TWENTY NINE--WITH ALVEOLOPLASTY (Bilateral)  Patient location during evaluation: PACU Anesthesia Type: General Level of consciousness: obtunded/minimal responses and patient remains intubated per anesthesia plan Vital Signs Assessment: post-procedure vital signs reviewed and stable Respiratory status: patient remains intubated per anesthesia plan Cardiovascular status: blood pressure returned to baseline Anesthetic complications: no Comments: Pt continues to have weak respiratory effort at the end of surgery.  She was reintubated in the PACU after a failed trial of BiPAP.  Critical care was consulted and will be transferred to ICU.    Last Vitals:  Filed Vitals:   07/17/15 1121 07/17/15 1206  BP: 114/75 82/54  Pulse:  124  Temp:    Resp: 13 10    Last Pain: There were no vitals filed for this visit.               Griffey Nicasio S

## 2015-07-17 NOTE — H&P (Signed)
PULMONARY / CRITICAL CARE MEDICINE   Name: [name] MRN: [id number] DOB: Apr 18, 1971    ADMISSION DATE:  07/17/2015 CONSULTATION DATE:  07/17/15  REFERRING MD:   Barbette Merino  CHIEF COMPLAINT:  Respiratory distress  HISTORY OF PRESENT ILLNESS:   [name] is a 45 year old female with past medical history of,tobacco abuse COPD, Diabetese Melitus, Asthama, GERD,PNA, Friedreich's ataxia, heart murmur.  She is O2 dependent and her was followed by Dr. Cyril Mourning in the clinic.  She was  also followed by the Hospice nurse.  She was suffering from bad oral infection for some time and did get her top teeth removed(2016).. She continued to complain about her painful teeth and was scheduled for her teeth extraction. She got her TEETH THREE, FOUR, FIVE, SIX, TEN, ELEVEN, TWELVE, THIRTEEN, FOURTEEN, TWENTY ONE, TWENTY TWO, TWENTY THREE, TWENTY FOUR, TWENTY FIVE, TWENTY SIX, TWENTY SEVEN, TWENTY NINE--WITH ALVEOLOPLASTY.   Post operatively she was in respiratory distress was placed on Bi PAP, CCM team was consulted.  Patient was obtunded therefore decided to intubate.   PAST MEDICAL HISTORY :  She  has a past medical history of Arthritis; COPD (chronic obstructive pulmonary disease) (HCC); Respiratory failure, acute (HCC); Diabetes mellitus; toe surgery; Ankle fracture; On supplemental oxygen therapy; Friedreich's ataxia (HCC); GERD (gastroesophageal reflux disease); Asthma; Heart murmur; Pneumonia; and Kidney stones.  PAST SURGICAL HISTORY: She  has past surgical history that includes Cholecystectomy; Knee arthroscopy; Toe Surgery; ORIF ankle fracture (04/23/2012); Hardware Removal (Left, 06/16/2012); Incision and drainage (Left, 06/16/2012); and Application if wound vac (Left, 06/16/2012).  No Known Allergies  No current facility-administered medications on file prior to encounter.   Current Outpatient Prescriptions on File Prior to Encounter  Medication Sig  . [facility] NON-ASPIRIN EXTRA STRENGTH 500  MG tablet TAKE 1 TABLET BY MOUTH EVERY 4 HOURS AS NEEDED FOR PAIN AND FEVER  . famotidine (PEPCID) 20 MG tablet Take 1 tablet (20 mg total) by mouth daily. (Patient taking differently: Take 20 mg by mouth daily as needed for heartburn or indigestion. )  . furosemide (LASIX) 20 MG tablet TAKE 1 TABLET BY MOUTH EVERY DAY AS NEEDED FOR LEG SWELLING  . ipratropium (ATROVENT) 0.02 % nebulizer solution PLACE 1 VIAL IN NEBULIZER 3 TIMES A DAY (Patient taking differently: PLACE 1 VIAL IN NEBULIZER every 6 hours)  . morphine (ROXANOL) 20 MG/ML concentrated solution Take 0.25 mLs (5 mg total) by mouth every 6 (six) hours as needed for severe pain.  . predniSONE (DELTASONE) 50 MG tablet Take 50 mg by mouth every morning.   Marland Kitchen guaiFENesin (MUCINEX) 600 MG 12 hr tablet Take 1 tablet (600 mg total) by mouth 2 (two) times daily as needed for cough or to loosen phlegm. (Patient not taking: Reported on 07/14/2015)  . levofloxacin (LEVAQUIN) 500 MG tablet Take 1 tablet (500 mg total) by mouth daily. (Patient not taking: Reported on 07/14/2015)    FAMILY HISTORY:  Her indicated that her mother is deceased. She indicated that her father is deceased. She indicated that both of her sisters are alive. She indicated that her brother is alive. She indicated that her maternal grandmother is deceased. She indicated that her maternal grandfather is deceased. She indicated that her paternal grandmother is deceased. She indicated that her paternal grandfather is deceased.   SOCIAL HISTORY: She  reports that she quit smoking about 5 years ago. Her smoking use included Cigarettes. She has a 5 pack-year smoking history. She has never used smokeless tobacco. She reports  that she does not drink alcohol or use illicit drugs.  REVIEW OF SYSTEMS:   Unable to obtain  SUBJECTIVE:  Unable to obtain  VITAL SIGNS: BP 82/54 mmHg  Pulse 124  Temp(Src) 98.1 F (36.7 C) (Oral)  Resp 10  Ht 5' (1.524 m)  Wt 79 lb (35.834 kg)  BMI 15.43  kg/m2  SpO2 90%  LMP 07/01/2015  HEMODYNAMICS:    VENTILATOR SETTINGS:    INTAKE / OUTPUT:    PHYSICAL EXAMINATION: General:  Sickly appearing white female. Neuro: Remains obtunded HEENT:  Atraumatic, normocephalic Cardiovascular:  S1S2, RRR. No MRG Lungs: diminshed bases,No wheezes , crackles Abdomen:  Soft, non distended Musculoskeletal:  No inflammation, deformity noted Skin:  Intact  LABS:  BMET  Recent Labs Lab 07/17/15 0702  NA 137  K 3.9  CL 83*  CO2 40*  BUN <5*  CREATININE 0.40*  GLUCOSE 107*    Electrolytes  Recent Labs Lab 07/17/15 0702  CALCIUM 8.5*    CBC  Recent Labs Lab 07/17/15 0702  WBC 6.9  HGB 15.3*  HCT 45.1  PLT 70*    Coag's No results for input(s): APTT, INR in the last 168 hours.  Sepsis Markers No results for input(s): LATICACIDVEN, PROCALCITON, O2SATVEN in the last 168 hours.  ABG  Recent Labs Lab 07/17/15 1155  PHART 7.075*  PCO2ART ABOVE REPORTABLE RANGE  PO2ART 159.0*    Liver Enzymes No results for input(s): AST, ALT, ALKPHOS, BILITOT, ALBUMIN in the last 168 hours.  Cardiac Enzymes No results for input(s): TROPONINI, PROBNP in the last 168 hours.  Glucose  Recent Labs Lab 07/17/15 1133  GLUCAP 251*    Imaging No results found.   STUDIES:  None  CULTURES: None  ANTIBIOTICS: None  SIGNIFICANT EVENTS: 3/17 Intubated>>  LINES/TUBES:  3/17 ET tube>>  DISCUSSION: 45 year old female with COPD, Asthma,  pulmonary disease) (HCC); Respiratory failure, acute (HCC); Diabetes mellitus;  On supplemental oxygen therapy; Friedreich's ataxia (HCC); GERD (gastroesophageal reflux disease); Asthma; Heart murmur; Pneumonia had tooth extraction and post operatively in respiraory distress requiring intubation.  ASSESSMENT / PLAN:  PULMONARY A: Acute On Chronic Hypoxic  respiratory failure COPD exacerbation   Hx of Asthma and tobacco abuse  P NPO Continue  vent support Wean as  tolerated Follow ABG  CXR in am Continue versed/fentanyl Solumedrol 60 mg 4 times  CARDIOVASCULAR A:  Hypotension P:  Continue Lactated ringer  RENAL A:   No active issues P:   Follow chemistry  GASTROINTESTINAL A:   Hx of GERD P:   Continue Protonix Will start tube feeding tomorrow if still on vent  HEMATOLOGIC A:   No active issues P:  scds for DVT prophylaxis  INFECTIOUS A:   No active issues P:   Cbc in am  ENDOCRINE A:   DM  P:   ssi coverage  NEUROLOGIC A:    Friedreich's ataxia     RASS goal: 0 -2   FAMILY  - Updates: Family was updated by Dr. Delton Coombes 3/17  - Inter-disciplinary family meet or Palliative Care meeting due by: 07/24/15   Bincy Varughese,AG-ACNP Pulmonary & Critical Care Pulmonary and Critical Care Medicine [facility] [place] Pager: [phone]  07/17/2015, 12:18 PM   Attending Note:  I have examined patient, reviewed labs, studies and notes. I have discussed the case with B Varughese, and I agree with the data and plans as amended above. Pt with severe COPD, Freidrich's ataxia, now s/p tooth extractions today. She was challenging  to extubate due to bronchospasm. In the PACU developed some resp distress and then apnea and quickly required BiPAP. She did not improve w NIPPV and we were called to evaluate. On my initial eval she was agonal, evolving resp failure. We intubated to facilitate MV. Will hope to wake her up over next 24h, push for SBT and a smooth extubation. Will treat with steroids and azithro for probable acute exacerbation COPD.  Independent critical care time is 50 minutes.   Levy Pupaobert Ariyon Gerstenberger, MD, PhD 07/17/2015, 2:17 PM [place] [facility] [phone] or if no answer [phone]

## 2015-07-17 NOTE — Anesthesia Procedure Notes (Addendum)
Procedure Name: Intubation Date/Time: 07/17/2015 9:30 AM Performed by: Marni GriffonJAMES, Carriann Hesse B Pre-anesthesia Checklist: Patient identified, Emergency Drugs available, Suction available and Patient being monitored Patient Re-evaluated:Patient Re-evaluated prior to inductionOxygen Delivery Method: Circle system utilized Preoxygenation: Pre-oxygenation with 100% oxygen Intubation Type: IV induction Laryngoscope Size: Mac and 3 Grade View: Grade II Tube type: Oral Tube size: 7.0 mm Number of attempts: 1 Airway Equipment and Method: Stylet Placement Confirmation: ETT inserted through vocal cords under direct vision,  breath sounds checked- equal and bilateral and positive ETCO2 Secured at: 20 (cm at upper gum) cm Tube secured with: Tape Dental Injury: Teeth and Oropharynx as per pre-operative assessment     Procedure Name: Intubation Date/Time: 07/17/2015 9:38 AM Performed by: Darcey NoraJAMES, Thelma Viana B Pre-anesthesia Checklist: Patient identified, Emergency Drugs available, Suction available and Patient being monitored Patient Re-evaluated:Patient Re-evaluated prior to inductionOxygen Delivery Method: Circle system utilized Preoxygenation: Pre-oxygenation with 100% oxygen Intubation Type: IV induction Ventilation: Mask ventilation with difficulty and Unable to mask ventilate Laryngoscope Size: Mac and 3 Grade View: Grade II Tube type: Oral Tube size: 7.0 mm Number of attempts: 1 (Dr. Chaney MallingHodierne removed first ETT b/c unable to move air.  Reintubated and marked ETT to keep it higher in trache) Airway Equipment and Method: Stylet Placement Confirmation: ETT inserted through vocal cords under direct vision,  breath sounds checked- equal and bilateral and positive ETCO2 Secured at: 19 (cm at upper gum) cm Tube secured with: Tape Dental Injury: Teeth and Oropharynx as per pre-operative assessment     Procedure Name: LMA Insertion Date/Time: 07/17/2015 10:28 AM Performed by: Darcey NoraJAMES, Melven Stockard B Pre-anesthesia  Checklist: Patient identified, Emergency Drugs available, Suction available and Patient being monitored Patient Re-evaluated:Patient Re-evaluated prior to inductionOxygen Delivery Method: Circle system utilized Preoxygenation: Pre-oxygenation with 100% oxygen Ventilation: Mask ventilation without difficulty LMA Size: 3.0 Number of attempts: 1 (Dr. Chaney MallingHodierne) Placement Confirmation: positive ETCO2 and breath sounds checked- equal and bilateral Tube secured with: Tape (taped across cheeks) Dental Injury: Teeth and Oropharynx as per pre-operative assessment

## 2015-07-17 NOTE — H&P (Signed)
H&P documentation  -History and Physical Reviewed  -Patient has been re-examined  -No change in the plan of care  Duffy Dantonio M  

## 2015-07-17 NOTE — Procedures (Signed)
Extubation Procedure Note  Patient Details:   Name: [name] DOB: 1970-06-14 MRN: [id number]   Airway Documentation:  Airway 7.5 mm (Active)  Secured at (cm) 23 cm 07/17/2015  8:00 PM  Measured From Lips 07/17/2015  8:00 PM  Secured Location Center 07/17/2015  7:16 PM  Secured By [employer] Tube Holder 07/17/2015  7:16 PM  Tube Holder Repositioned Yes 07/17/2015  7:16 PM  Site Condition Dry 07/17/2015  8:00 PM    Evaluation  O2 sats: stable throughout Complications: No apparent complications Patient did tolerate procedure well. Bilateral Breath Sounds: Rhonchi, Expiratory wheezes Suctioning: Oral, Airway Yes  [name] 07/17/2015, 9:18 PM

## 2015-07-17 NOTE — Progress Notes (Signed)
[facility] [place] [facility] [place] [place]  This is a GIP-Related, Covered admission from 07/17/15 with [facility] diagnosis of COPD.   Patient came in today for purposes of having teeth extractions- During procedure patient developed respiratory distress and was intubated.  Spoke with [facility] nurse Onalee Huaavid who reported that initial attempt to extubate the patient was unsuccessful and she was reintubated.  The plan is to extubate her when medically stable and transfer to ICU.  I did speak with her brother and sister in the waiting area.  They did state that there is a funeral tomorrow morning for the patient' s mother who has just passed away.   She is on Fentanyl 100 mcg for pain and is on IV fluids of Lactated ringers solution.    [facility] medication list and transfer summary given to Nurse to place in shadow chart.   [facility] will continue to follow patient daily and anticipate discharge needs.  Please call with questions.   Lavone NeriSusan Aceyn Kathol, Rn [facility]  [phone]

## 2015-07-17 NOTE — Progress Notes (Signed)
Ccm at bedside/ steve with dr byrum

## 2015-07-17 NOTE — Progress Notes (Signed)
Reintubated/ size 6 ett / 24 at the lip

## 2015-07-17 NOTE — Anesthesia Preprocedure Evaluation (Signed)
Anesthesia Evaluation  Patient identified by MRN, date of birth, ID band Patient awake    Reviewed: Allergy & Precautions, NPO status , Patient's Chart, lab work & pertinent test results  Airway Mallampati: II   Neck ROM: full    Dental   Pulmonary asthma , COPD, former smoker,    breath sounds clear to auscultation       Cardiovascular negative cardio ROS   Rhythm:regular Rate:Normal     Neuro/Psych    GI/Hepatic GERD  ,  Endo/Other  diabetes, Type 2  Renal/GU      Musculoskeletal  (+) Arthritis ,   Abdominal   Peds  Hematology   Anesthesia Other Findings   Reproductive/Obstetrics                             Anesthesia Physical Anesthesia Plan  ASA: III  Anesthesia Plan: General   Post-op Pain Management:    Induction: Intravenous  Airway Management Planned: Nasal ETT  Additional Equipment:   Intra-op Plan:   Post-operative Plan: Extubation in OR  Informed Consent: I have reviewed the patients History and Physical, chart, labs and discussed the procedure including the risks, benefits and alternatives for the proposed anesthesia with the patient or authorized representative who has indicated his/her understanding and acceptance.     Plan Discussed with: CRNA, Anesthesiologist and Surgeon  Anesthesia Plan Comments:         Anesthesia Quick Evaluation

## 2015-07-17 NOTE — Progress Notes (Signed)
Albumin initiated after conversation with dr Noreene Larssonjoslin re sbp's in 70's to low 80's / goal sbp >/= 90

## 2015-07-17 NOTE — Progress Notes (Signed)
Dr Billey Changhodeierne requested ccm to see

## 2015-07-17 NOTE — Op Note (Signed)
07/17/2015  10:05 AM  PATIENT:  [name]  45 y.o. female  PRE-OPERATIVE DIAGNOSIS:  NON RESTORABLE TEETH #3, 4, 5, 6, 10, 11, 12, 13, 14, 21, 22, 23, 24, 25, 26, 27, 29  POST-OPERATIVE DIAGNOSIS:  SAME  PROCEDURE:  Procedure(s): BILATERAL MULTIPLE EXTRACTIONS--TEETH THREE, FOUR, FIVE, SIX, TEN, ELEVEN, TWELVE, THIRTEEN, FOURTEEN, TWENTY ONE, TWENTY TWO, TWENTY THREE, TWENTY FOUR, TWENTY FIVE, TWENTY SIX, TWENTY SEVEN, TWENTY NINE--WITH ALVEOLOPLASTY  SURGEON:  Surgeon(s): Ocie DoyneScott Hadassah Rana, DDS  ANESTHESIA:   local and general  EBL:  minimal  DRAINS: none   SPECIMEN:  No Specimen  COUNTS:  YES  PLAN OF CARE: Discharge to home after PACU  PATIENT DISPOSITION:  PACU - hemodynamically stable.   PROCEDURE DETAILS: Dictation # [id number] Georgia LopesScott M. Durwin Davisson, DMD 07/17/2015 10:05 AM

## 2015-07-17 NOTE — Progress Notes (Signed)
Pt transported on vent to 2M02

## 2015-07-17 NOTE — Progress Notes (Signed)
Placed on bipap rate 16  , 16/8 fio2 .4 volumes @200 -250

## 2015-07-17 NOTE — Progress Notes (Signed)
1100 Called to place pt on CPAP.  Pt placed on CPAP with nasal mask 4L bleed in, pt unresponsive, decreased air movement.  1130 Pt changed to NIV via vent with set rate.  VT's decreased 100-200, VE 2.0-2.5, MD called and made aware, CCM consulted.  1200 ABG done and pt intubated at this time, placed pt on vent settings per MD at bedside.  Pt tolerating well, RT will continue to monitor.

## 2015-07-17 NOTE — Progress Notes (Signed)
eLink Physician-Brief Progress Note Patient Name: [name] DOB: 10-15-1970 MRN: [id number]   Date of Service  07/17/2015  HPI/Events of Note  New patient eval Stable on vent S/p multiple tooth extractions, had ?COPD exacerbation?   eICU Interventions  Add albuterol Consider extubation tonight, appears very stable Have asked for RT to put her on pressure support ventilation     Intervention Category Major Interventions: Respiratory failure - evaluation and management Intermediate Interventions: Hyperglycemia - evaluation and treatment  Max FickleDouglas McQuaid 07/17/2015, 6:19 PM

## 2015-07-18 DIAGNOSIS — J441 Chronic obstructive pulmonary disease with (acute) exacerbation: Secondary | ICD-10-CM | POA: Diagnosis not present

## 2015-07-18 DIAGNOSIS — J9601 Acute respiratory failure with hypoxia: Secondary | ICD-10-CM | POA: Diagnosis not present

## 2015-07-18 DIAGNOSIS — J9600 Acute respiratory failure, unspecified whether with hypoxia or hypercapnia: Secondary | ICD-10-CM | POA: Diagnosis not present

## 2015-07-18 DIAGNOSIS — G111 Early-onset cerebellar ataxia: Secondary | ICD-10-CM | POA: Diagnosis not present

## 2015-07-18 DIAGNOSIS — Z9981 Dependence on supplemental oxygen: Secondary | ICD-10-CM | POA: Diagnosis not present

## 2015-07-18 DIAGNOSIS — J9621 Acute and chronic respiratory failure with hypoxia: Secondary | ICD-10-CM | POA: Diagnosis not present

## 2015-07-18 DIAGNOSIS — J449 Chronic obstructive pulmonary disease, unspecified: Secondary | ICD-10-CM | POA: Diagnosis not present

## 2015-07-18 LAB — BASIC METABOLIC PANEL
ANION GAP: 13 (ref 5–15)
BUN: <5 mg/dL — ABNORMAL LOW (ref 6–20)
CALCIUM: 8.1 mg/dL — AB (ref 8.9–10.3)
CO2: 34 mmol/L — AB (ref 22–32)
CREATININE: 0.52 mg/dL (ref 0.44–1.00)
Chloride: 91 mmol/L — ABNORMAL LOW (ref 101–111)
Glucose, Bld: 128 mg/dL — ABNORMAL HIGH (ref 65–99)
Potassium: 4.2 mmol/L (ref 3.5–5.1)
SODIUM: 138 mmol/L (ref 135–145)

## 2015-07-18 LAB — CBC
HEMATOCRIT: 39.5 % (ref 36.0–46.0)
Hemoglobin: 12.8 g/dL (ref 12.0–15.0)
MCH: 33.3 pg (ref 26.0–34.0)
MCHC: 32.4 g/dL (ref 30.0–36.0)
MCV: 102.9 fL — ABNORMAL HIGH (ref 78.0–100.0)
Platelets: 69 10*3/uL — ABNORMAL LOW (ref 150–400)
RBC: 3.84 MIL/uL — ABNORMAL LOW (ref 3.87–5.11)
RDW: 17.1 % — AB (ref 11.5–15.5)
WBC: 7.5 10*3/uL (ref 4.0–10.5)

## 2015-07-18 LAB — GLUCOSE, CAPILLARY
GLUCOSE-CAPILLARY: 119 mg/dL — AB (ref 65–99)
GLUCOSE-CAPILLARY: 110 mg/dL — AB (ref 65–99)
GLUCOSE-CAPILLARY: 192 mg/dL — AB (ref 65–99)
Glucose-Capillary: 109 mg/dL — ABNORMAL HIGH (ref 65–99)
Glucose-Capillary: 122 mg/dL — ABNORMAL HIGH (ref 65–99)
Glucose-Capillary: 212 mg/dL — ABNORMAL HIGH (ref 65–99)

## 2015-07-18 MED ORDER — MORPHINE SULFATE (CONCENTRATE) 10 MG/0.5ML PO SOLN: 5.0000 mg | ORAL | Status: DC | PRN

## 2015-07-18 MED ORDER — CETYLPYRIDINIUM CHLORIDE 0.05 % MT LIQD
7.0000 mL | Freq: Two times a day (BID) | OROMUCOSAL | Status: DC
  Administered 2015-07-18 (×2): 7 mL via OROMUCOSAL

## 2015-07-18 NOTE — Procedures (Signed)
Intubation Procedure Note Quillian Quinceheresa L Minasyan [id number] 22-Mar-1971 Date of procedure: 07/17/15 12:41 Procedure: Intubation Indications: Respiratory insufficiency  Procedure Details Consent: Unable to obtain consent because of altered level of consciousness. Time Out: Verified patient identification, verified procedure, site/side was marked, verified correct patient position, special equipment/implants available, medications/allergies/relevent history reviewed, required imaging and test results available.  Performed  MAC and 3 Medications:  Fentanyl  Etomidate Versed NMB  Diprivan per anesthesia   Evaluation Hemodynamic Status: BP stable throughout; O2 sats: stable throughout Patient's Current Condition: stable Complications: No apparent complications Patient did tolerate procedure well. Chest X-ray ordered to verify placement.  CXR: tube position acceptable.   Brett CanalesSteve Minor ACNP [facility] Pager [phone] till 3 pm If no answer page [phone] 07/18/2015, 8:48 AM   Levy Pupaobert Jomaira Darr, MD, PhD 07/18/2015, 4:31 PM [place] [facility] [phone] or if no answer [phone]

## 2015-07-18 NOTE — Progress Notes (Signed)
Dr. Silvestre GunnerMannan was made aware that pt had 6 "R on T" and that this RN got an EKG. Additional, he was made aware of peaked T waves on unit monitors and possible peaked T waves in EKG. RN requested EKG be confirmed.  Also informed Dr. Silvestre GunnerMannan that the pt's K+ was normal at 2 AM.

## 2015-07-18 NOTE — Progress Notes (Signed)
PULMONARY / CRITICAL CARE MEDICINE   Name: [name] MRN: [id number] DOB: 11/24/1970    ADMISSION DATE:  07/17/2015 CONSULTATION DATE:  07/17/15  REFERRING MD:   Barbette MerinoJensen  CHIEF COMPLAINT:  Respiratory distress  HISTORY OF PRESENT ILLNESS:   [name] is a 45 year old female with past medical history of,tobacco abuse COPD, Diabetese Melitus, Asthama, GERD,PNA, Friedreich's ataxia, heart murmur.  She is O2 dependent and her was followed by Dr. Cyril Mourningakesh Alva in the clinic.  She was  also followed by the Hospice nurse.  She was suffering from bad oral infection for some time and did get her top teeth removed(2016).. She continued to complain about her painful teeth and was scheduled for her teeth extraction. She got her TEETH THREE, FOUR, FIVE, SIX, TEN, ELEVEN, TWELVE, THIRTEEN, FOURTEEN, TWENTY ONE, TWENTY TWO, TWENTY THREE, TWENTY FOUR, TWENTY FIVE, TWENTY SIX, TWENTY SEVEN, TWENTY NINE--WITH ALVEOLOPLASTY.   Post operatively she was in respiratory distress was placed on Bi PAP, CCM team was consulted.  Patient was obtunded therefore decided to intubate.     SUBJECTIVE:  NAD  VITAL SIGNS: BP 81/56 mmHg  Pulse 61  Temp(Src) 98.4 F (36.9 C) (Oral)  Resp 21  Ht 5' (1.524 m)  Wt 79 lb 9.4 oz (36.1 kg)  BMI 15.54 kg/m2  SpO2 97%  LMP 07/01/2015  HEMODYNAMICS:    VENTILATOR SETTINGS: Vent Mode:  [-] PSV FiO2 (%):  [40 %-100 %] 40 % Set Rate:  [16 bmp] 16 bmp Vt Set:  [480 mL] 480 mL PEEP:  [5 cmH20] 5 cmH20 Pressure Support:  [10 cmH20] 10 cmH20 Plateau Pressure:  [15 cmH20-26 cmH20] 15 cmH20  INTAKE / OUTPUT: I/O last 3 completed shifts: In: 1263 [I.V.:1138; IV Piggyback:125] Out: 600 [Urine:500; Blood:100]  PHYSICAL EXAMINATION: General:  Sickly appearing white female. Awake and alert Neuro: Awake and follows commands HEENT:  Tooth extraction sites unremarkable Cardiovascular:  S1S2, RRR. No MRG Lungs: diminshed bases,mild wheezws Abdomen:  Soft, non  distended Musculoskeletal:  No inflammation, deformity noted Skin:  Intact  LABS:  BMET  Recent Labs Lab 07/17/15 0702 07/17/15 1454 07/18/15 0227  NA 137 137 138  K 3.9 3.7 4.2  CL 83* 90* 91*  CO2 40* 35* 34*  BUN <5* <5* <5*  CREATININE 0.40* 0.45 0.52  GLUCOSE 107* 36* 128*    Electrolytes  Recent Labs Lab 07/17/15 0702 07/17/15 1454 07/18/15 0227  CALCIUM 8.5* 7.7* 8.1*  MG  --  1.3*  --   PHOS  --  1.4*  --     CBC  Recent Labs Lab 07/17/15 0702 07/17/15 1454 07/18/15 0227  WBC 6.9 6.1 7.5  HGB 15.3* 11.8* 12.8  HCT 45.1 35.5* 39.5  PLT 70* 79* 69*    Coag's No results for input(s): APTT, INR in the last 168 hours.  Sepsis Markers No results for input(s): LATICACIDVEN, PROCALCITON, O2SATVEN in the last 168 hours.  ABG  Recent Labs Lab 07/17/15 1155  PHART 7.075*  PCO2ART ABOVE REPORTABLE RANGE  PO2ART 159.0*    Liver Enzymes No results for input(s): AST, ALT, ALKPHOS, BILITOT, ALBUMIN in the last 168 hours.  Cardiac Enzymes No results for input(s): TROPONINI, PROBNP in the last 168 hours.  Glucose  Recent Labs Lab 07/17/15 1611 07/17/15 1743 07/17/15 1945 07/17/15 2320 07/18/15 0343 07/18/15 0802  GLUCAP 211* 126* 109* 109* 122* 119*    Imaging Dg Chest Port 1 View  07/17/2015  CLINICAL DATA:  Endotracheal and nasogastric tube  placements. EXAM: PORTABLE CHEST 1 VIEW COMPARISON:  Sep 08, 2014. FINDINGS: The heart size and mediastinal contours are within normal limits. Both lungs are clear. Endotracheal tube is in grossly good position with distal tip 1 cm above the carina. No pneumothorax or pleural effusion is noted. The visualized skeletal structures are unremarkable. IMPRESSION: Endotracheal tube in grossly good position. No acute cardiopulmonary abnormality seen. Electronically Signed   By: Lupita Raider, M.D.   On: 07/17/2015 12:51     STUDIES:  None  CULTURES: None  ANTIBIOTICS: None  SIGNIFICANT  EVENTS: 3/17 Intubated>>  LINES/TUBES:  3/17 ET tube>>  DISCUSSION: 45 year old female with COPD, Asthma,  pulmonary disease) (HCC); Respiratory failure, acute (HCC); Diabetes mellitus;  On supplemental oxygen therapy; Friedreich's ataxia (HCC); GERD (gastroesophageal reflux disease); Asthma; Heart murmur; Pneumonia had tooth extraction and post operatively in respiraory distress requiring intubation.  ASSESSMENT / PLAN:  PULMONARY A: Acute On Chronic Hypoxic  respiratory failure COPD exacerbation   Hx of Asthma and tobacco abuse  P Extubated 3/17 Start diet Solumedrol 60 mg 4 times  CARDIOVASCULAR A:  Hypotension(resolved) P:  KVO fluids  RENAL A:   No active issues P:   Follow chemistry  GASTROINTESTINAL A:   Hx of GERD P:   Continue Protonix Will start tube feeding tomorrow if still on vent  HEMATOLOGIC A:   No active issues P:  scds for DVT prophylaxis  INFECTIOUS A:   No active issues P:   Cbc  ENDOCRINE CBG (last 3)   Recent Labs  07/17/15 2320 07/18/15 0343 07/18/15 0802  GLUCAP 109* 122* 119*     A:   DM  P:   ssi coverage  NEUROLOGIC A:    Friedreich's ataxia, end stage. She is in hospice and we will change her back to dnr STATUS.     RASS goal: 0 -2 Resume home narcotics   FAMILY  - Updates: Family was updated at bedside 3/18.  We will leave in ICU for one more day. Change her back to DNR Hospice is making arrangements for her to go home.  - Inter-disciplinary family meet or Palliative Care meeting due by: 07/24/15   Brett Canales Minor ACNP [facility] Pager [phone] till 3 pm If no answer page [phone] 07/18/2015, 11:34 AM   Attending Note:  I have examined patient, reviewed labs, studies and notes. I have discussed the case with S Minor, and I agree with the data and plans as amended above. Ms [name] was able to be extubated last evening, has done well since. She is profoundly weak, but stable. I believe she will  be able to take PO - will order a soft diet. Appreciate Oral Surgery's input ana assistance. She has hospice care at home, has voiced some questions to RN's about code status, possibly being full code. We will attempt to address this issue, clarify.   Levy Pupa, MD, PhD 07/18/2015, 4:31 PM [place] [facility] [phone] or if no answer [phone]

## 2015-07-18 NOTE — Progress Notes (Signed)
[name] PROGRESS NOTE:   45 YO WF admitted to ICU from PACU 07/17/2015 with acute respiratory failure s/p dental extractions with alveoloplasty. Currently extubated breathing spontaneously in NAD.  OBJECTIVE:  Vitals: Blood pressure 81/56, pulse 61, temperature 98.7 F (37.1 C), temperature source Oral, resp. rate 21, height 5' (1.524 m), weight 36.1 kg (79 lb 9.4 oz), last menstrual period 07/01/2015, SpO2 97 %. Lab results: Results for orders placed or performed during the hospital encounter of 07/17/15 (from the past 24 hour(s))  Basic metabolic panel     Status: Abnormal   Collection Time: 07/17/15  2:54 PM  Result Value Ref Range   Sodium 137 135 - 145 mmol/L   Potassium 3.7 3.5 - 5.1 mmol/L   Chloride 90 (L) 101 - 111 mmol/L   CO2 35 (H) 22 - 32 mmol/L   Glucose, Bld 36 (LL) 65 - 99 mg/dL   BUN <5 (L) 6 - 20 mg/dL   Creatinine, Ser 9.60 0.44 - 1.00 mg/dL   Calcium 7.7 (L) 8.9 - 10.3 mg/dL   GFR calc non Af Amer >60 >60 mL/min   GFR calc Af Amer >60 >60 mL/min   Anion gap 12 5 - 15  Magnesium     Status: Abnormal   Collection Time: 07/17/15  2:54 PM  Result Value Ref Range   Magnesium 1.3 (L) 1.7 - 2.4 mg/dL  Phosphorus     Status: Abnormal   Collection Time: 07/17/15  2:54 PM  Result Value Ref Range   Phosphorus 1.4 (L) 2.5 - 4.6 mg/dL  CBC     Status: Abnormal   Collection Time: 07/17/15  2:54 PM  Result Value Ref Range   WBC 6.1 4.0 - 10.5 K/uL   RBC 3.55 (L) 3.87 - 5.11 MIL/uL   Hemoglobin 11.8 (L) 12.0 - 15.0 g/dL   HCT 45.4 (L) 09.8 - 11.9 %   MCV 100.0 78.0 - 100.0 fL   MCH 33.2 26.0 - 34.0 pg   MCHC 33.2 30.0 - 36.0 g/dL   RDW 14.7 (H) 82.9 - 56.2 %   Platelets 79 (L) 150 - 400 K/uL  Glucose, capillary     Status: Abnormal   Collection Time: 07/17/15  3:33 PM  Result Value Ref Range   Glucose-Capillary 40 (LL) 65 - 99 mg/dL   Comment 1 Notify RN    Comment 2 Repeat Test    Comment 3 Document in Chart   Glucose, capillary     Status: Abnormal    Collection Time: 07/17/15  3:35 PM  Result Value Ref Range   Glucose-Capillary 46 (L) 65 - 99 mg/dL  Glucose, capillary     Status: Abnormal   Collection Time: 07/17/15  4:11 PM  Result Value Ref Range   Glucose-Capillary 211 (H) 65 - 99 mg/dL  Glucose, capillary     Status: Abnormal   Collection Time: 07/17/15  5:43 PM  Result Value Ref Range   Glucose-Capillary 126 (H) 65 - 99 mg/dL  MRSA PCR Screening     Status: None   Collection Time: 07/17/15  5:44 PM  Result Value Ref Range   MRSA by PCR NEGATIVE NEGATIVE  Glucose, capillary     Status: Abnormal   Collection Time: 07/17/15  7:45 PM  Result Value Ref Range   Glucose-Capillary 109 (H) 65 - 99 mg/dL  Glucose, capillary     Status: Abnormal   Collection Time: 07/17/15 11:20 PM  Result Value Ref Range   Glucose-Capillary 109 (  H) 65 - 99 mg/dL  CBC     Status: Abnormal   Collection Time: 07/18/15  2:27 AM  Result Value Ref Range   WBC 7.5 4.0 - 10.5 K/uL   RBC 3.84 (L) 3.87 - 5.11 MIL/uL   Hemoglobin 12.8 12.0 - 15.0 g/dL   HCT 19.139.5 47.836.0 - 29.546.0 %   MCV 102.9 (H) 78.0 - 100.0 fL   MCH 33.3 26.0 - 34.0 pg   MCHC 32.4 30.0 - 36.0 g/dL   RDW 62.117.1 (H) 30.811.5 - 65.715.5 %   Platelets 69 (L) 150 - 400 K/uL  Basic metabolic panel     Status: Abnormal   Collection Time: 07/18/15  2:27 AM  Result Value Ref Range   Sodium 138 135 - 145 mmol/L   Potassium 4.2 3.5 - 5.1 mmol/L   Chloride 91 (L) 101 - 111 mmol/L   CO2 34 (H) 22 - 32 mmol/L   Glucose, Bld 128 (H) 65 - 99 mg/dL   BUN <5 (L) 6 - 20 mg/dL   Creatinine, Ser 8.460.52 0.44 - 1.00 mg/dL   Calcium 8.1 (L) 8.9 - 10.3 mg/dL   GFR calc non Af Amer >60 >60 mL/min   GFR calc Af Amer >60 >60 mL/min   Anion gap 13 5 - 15  Glucose, capillary     Status: Abnormal   Collection Time: 07/18/15  3:43 AM  Result Value Ref Range   Glucose-Capillary 122 (H) 65 - 99 mg/dL  Glucose, capillary     Status: Abnormal   Collection Time: 07/18/15  8:02 AM  Result Value Ref Range    Glucose-Capillary 119 (H) 65 - 99 mg/dL   Radiology Results: Dg Chest Port 1 View  07/17/2015  CLINICAL DATA:  Endotracheal and nasogastric tube placements. EXAM: PORTABLE CHEST 1 VIEW COMPARISON:  Sep 08, 2014. FINDINGS: The heart size and mediastinal contours are within normal limits. Both lungs are clear. Endotracheal tube is in grossly good position with distal tip 1 cm above the carina. No pneumothorax or pleural effusion is noted. The visualized skeletal structures are unremarkable. IMPRESSION: Endotracheal tube in grossly good position. No acute cardiopulmonary abnormality seen. Electronically Signed   By: Lupita RaiderJames  Green Jr, M.D.   On: 07/17/2015 12:51   General appearance: alert, cooperative and no distress Head: Normocephalic, without obvious abnormality, atraumatic Eyes: negative Throat: Mild oral edema. Sutures intact, hemostatic. No trismus. Neck: no adenopathy, supple, symmetrical, trachea midline and Bilateral mandibular edema, soft, non-indurated.  ASSESSMENT: Normal post-op swelling from oral surgery procedure. Acute respiratory failure appears resolved.  PLAN: Stable for discharge from oral surgery standpoint. Will follow up in office in 2 weeks.  Georgia LopesJENSEN,Keyaira Clapham M 07/18/2015

## 2015-07-18 NOTE — Progress Notes (Addendum)
Pt made DNR by Brett CanalesSteve Minor. Pt stated to Sherrye PayorSabrina Elliott RN that she wanted "everything to be done if her heart stopped beating." The pt also stated that she was told she was "too little to have CPR performed on her." I called Dr. Delton CoombesByrum to make him aware. He stated that he would be having a conversation with the patient and the family regarding her code status.

## 2015-07-18 NOTE — Progress Notes (Signed)
[facility]   [facility]  This is a GIP-Related, Covered admission from 07/17/15 with [facility] diagnosis of COPD per Dr. Maurene CapesMonguiloid.   Hospice records indicate patient is DNR.   Visited patient at the bedside.  Patient's son Geralynn OchsSamuel Whitfield was present.  Patient was resting comfortably. She did arouse and was able to respond verbally.  Face is edematous. She is breathing on her own with 02 via nasal canula.   NP was also present and plans to keep patient in the hospital for at least one more day to monitor.  The patient is saddened because she is missing her mother's funeral but she did agree that she isn't ready to be discharged.   Patient was given albumin  On 3/17  and is currently on Zithromax 250mg  q24 h , Duonebs q6h and solumedrol 60mg  q6h.   [facility] will continue to follow patient daily and anticipate any discharge needs.  Please call [facility] with any hospice related questions or concerns.   Lavone NeriSusan Jamauri Kruzel, RN,  [facility]  [facility]  [phone]

## 2015-07-18 NOTE — Progress Notes (Signed)
Utilization Review Completed.[name] T3/18/2017  

## 2015-07-18 NOTE — Progress Notes (Signed)
Previous note regarding code status was co-written by Vassie MoselleSabrina Elliot RN

## 2015-07-18 NOTE — Progress Notes (Addendum)
RN present in room with RT while pt was extubated. Pt placed on 4 Liters [place]. No complicated noted. Pt is following all commands. RN will continue to monitor pt closely.

## 2015-07-19 DIAGNOSIS — J449 Chronic obstructive pulmonary disease, unspecified: Secondary | ICD-10-CM

## 2015-07-19 DIAGNOSIS — J9601 Acute respiratory failure with hypoxia: Secondary | ICD-10-CM | POA: Diagnosis not present

## 2015-07-19 LAB — GLUCOSE, CAPILLARY
Glucose-Capillary: 160 mg/dL — ABNORMAL HIGH (ref 65–99)
Glucose-Capillary: 201 mg/dL — ABNORMAL HIGH (ref 65–99)
Glucose-Capillary: 216 mg/dL — ABNORMAL HIGH (ref 65–99)

## 2015-07-19 MED ORDER — CHLORHEXIDINE GLUCONATE 0.12% ORAL RINSE (MEDLINE KIT): 15.0000 mL | Freq: Two times a day (BID) | OROMUCOSAL | Status: AC

## 2015-07-19 MED ORDER — CETYLPYRIDINIUM CHLORIDE 0.05 % MT LIQD: 7.0000 mL | Freq: Two times a day (BID) | OROMUCOSAL | Status: AC

## 2015-07-19 NOTE — Discharge Summary (Signed)
Physician Discharge Summary  Patient ID: [name] MRN: [id number] DOB/AGE: 43-Apr-1972 43 y.o.  Admit date: 07/17/2015 Discharge date: 07/19/2015  Problem List Active Problems:   Acute respiratory failure (HCC)  HPI: [name] is a 45 year old female with past medical history of,tobacco abuse COPD, Diabetese Melitus, Asthama, GERD,PNA, Friedreich's ataxia, heart murmur. She is O2 dependent and her was followed by Dr. Cyril Mourningakesh Alva in the clinic. She was also followed by the Hospice nurse. She was suffering from bad oral infection for some time and did get her top teeth removed(2016).. She continued to complain about her painful teeth and was scheduled for her teeth extraction. She got her TEETH THREE, FOUR, FIVE, SIX, TEN, ELEVEN, TWELVE, THIRTEEN, FOURTEEN, TWENTY ONE, TWENTY TWO, TWENTY THREE, TWENTY FOUR, TWENTY FIVE, TWENTY SIX, TWENTY SEVEN, TWENTY NINE--WITH ALVEOLOPLASTY.  Post operatively she was in respiratory distress was placed on Bi PAP, CCM team was consulted. Patient was obtunded therefore decided to intubate.  Hospital Course:   STUDIES:  None  CULTURES: None  ANTIBIOTICS: None  SIGNIFICANT EVENTS: 3/17 Intubated>>then extubated  LINES/TUBES: 3/17 ET tube>>3/17   DISCUSSION: 45 year old female with COPD, Asthma, pulmonary disease) (HCC); Respiratory failure, acute (HCC); Diabetes mellitus; On supplemental oxygen therapy; Friedreich's ataxia (HCC); GERD (gastroesophageal reflux disease); Asthma; Heart murmur; Pneumonia had tooth extraction and post operatively in respiraory distress requiring intubation.  ASSESSMENT / PLAN:  PULMONARY A: Acute On Chronic Hypoxic respiratory failure COPD exacerbation  Hx of Asthma and tobacco abuse  P Extubated 3/17 Resume home meds   CARDIOVASCULAR A:  Hypotension(resolved) P:  KVO fluids  RENAL A:  No active issues P:  Follow chemistry  GASTROINTESTINAL A:  Hx of GERD P:   Continue Protonix Will start tube feeding tomorrow if still on vent  HEMATOLOGIC A:  No active issues P:  scds for DVT prophylaxis  INFECTIOUS A:  No active issues P:  Cbc  ENDOCRINE CBG (last 3)   Recent Labs (last 2 labs)      Recent Labs  07/17/15 2320 07/18/15 0343 07/18/15 0802  GLUCAP 109* 122* 119*       A:  DM  P:  ssi coverage  NEUROLOGIC A:   Friedreich's ataxia, end stage. She is in hospice and we will change her back to dnr STATUS.   RASS goal: 0 -2 Resume home narcotics         Labs at discharge Lab Results  Component Value Date   CREATININE 0.52 07/18/2015   BUN <5* 07/18/2015   NA 138 07/18/2015   K 4.2 07/18/2015   CL 91* 07/18/2015   CO2 34* 07/18/2015   Lab Results  Component Value Date   WBC 7.5 07/18/2015   HGB 12.8 07/18/2015   HCT 39.5 07/18/2015   MCV 102.9* 07/18/2015   PLT 69* 07/18/2015   Lab Results  Component Value Date   ALT 25 08/02/2012   AST 29 08/02/2012   ALKPHOS 110 08/02/2012   BILITOT 0.9 08/02/2012   Lab Results  Component Value Date   INR 0.98 07/12/2011    Current radiology studies Dg Chest Port 1 View  07/17/2015  CLINICAL DATA:  Endotracheal and nasogastric tube placements. EXAM: PORTABLE CHEST 1 VIEW COMPARISON:  Sep 08, 2014. FINDINGS: The heart size and mediastinal contours are within normal limits. Both lungs are clear. Endotracheal tube is in grossly good position with distal tip 1 cm above the carina. No pneumothorax or pleural effusion is noted. The visualized skeletal  structures are unremarkable. IMPRESSION: Endotracheal tube in grossly good position. No acute cardiopulmonary abnormality seen. Electronically Signed   By: Lupita Raider, M.D.   On: 07/17/2015 12:51    Disposition:  01-Home or Self Care  Discharge Instructions    Call MD for:  difficulty breathing, headache or visual disturbances    Complete by:  As directed      Call MD for:  persistant  nausea and vomiting    Complete by:  As directed      Call MD for:  severe uncontrolled pain    Complete by:  As directed      Call MD for:  temperature >100.4    Complete by:  As directed      Diet - low sodium heart healthy    Complete by:  As directed   Soft Diet. Advance as tolerated.     Discharge instructions    Complete by:  As directed   Warm salt water mouth rinses 4-5 times per day starting the day after surgery. Ice to affected area for 2-3 days. Soft diet, advance as tolerated. No smoking for 2 weeks. Follow-up visit with Dr. Barbette Merino as scheduled. Call [phone] for problems.     Increase activity slowly    Complete by:  As directed             Medication List    STOP taking these medications        [employer] NON-ASPIRIN EXTRA STRENGTH 500 MG tablet  Generic drug:  acetaminophen      TAKE these medications        albuterol (2.5 MG/3ML) 0.083% nebulizer solution  Commonly known as:  PROVENTIL  Take 2.5 mg by nebulization every 6 (six) hours.     PROAIR HFA 108 (90 Base) MCG/ACT inhaler  Generic drug:  albuterol  Inhale 2 puffs into the lungs every 4 (four) hours as needed for wheezing or shortness of breath.     antiseptic oral rinse 0.05 % Liqd solution  Commonly known as:  CPC / CETYLPYRIDINIUM CHLORIDE 0.05%  7 mLs by Mouth Rinse route 2 (two) times daily.     chlorhexidine gluconate 0.12 % solution  Commonly known as:  PERIDEX  15 mLs by Mouth Rinse route 2 (two) times daily.     famotidine 20 MG tablet  Commonly known as:  PEPCID  Take 1 tablet (20 mg total) by mouth daily.     furosemide 20 MG tablet  Commonly known as:  LASIX  TAKE 1 TABLET BY MOUTH EVERY DAY AS NEEDED FOR LEG SWELLING     guaiFENesin 600 MG 12 hr tablet  Commonly known as:  MUCINEX  Take 1 tablet (600 mg total) by mouth 2 (two) times daily as needed for cough or to loosen phlegm.     ibuprofen 800 MG tablet  Commonly known as:  ADVIL,MOTRIN  Take 800 mg by mouth every 6 (six)  hours as needed for moderate pain.     ipratropium 0.02 % nebulizer solution  Commonly known as:  ATROVENT  PLACE 1 VIAL IN NEBULIZER 3 TIMES A DAY     levofloxacin 500 MG tablet  Commonly known as:  LEVAQUIN  Take 1 tablet (500 mg total) by mouth daily.     morphine 20 MG/ML concentrated solution  Commonly known as:  ROXANOL  Take 0.25 mLs (5 mg total) by mouth every 6 (six) hours as needed for severe pain.     oxyCODONE-acetaminophen 5-325 MG  tablet  Commonly known as:  PERCOCET  Take 1 tablet by mouth every 4 (four) hours as needed for severe pain.     predniSONE 50 MG tablet  Commonly known as:  DELTASONE  Take 50 mg by mouth every morning.     prochlorperazine 5 MG tablet  Commonly known as:  COMPAZINE  Take 5-10 mg by mouth every 6 (six) hours as needed for nausea or vomiting.     promethazine 50 MG tablet  Commonly known as:  PHENERGAN  Take 50 mg by mouth every 8 (eight) hours as needed for nausea or vomiting.     SYMBICORT 160-4.5 MCG/ACT inhaler  Generic drug:  budesonide-formoterol  Inhale 2 puffs into the lungs 2 (two) times daily.          Discharged Condition: poor  Time spent on discharge greater than 40 minutes.  Vital signs at Discharge. Temp:  [97.8 F (36.6 C)-98.7 F (37.1 C)] 97.8 F (36.6 C) (03/19 0757) Pulse Rate:  [58-155] 99 (03/19 0400) Resp:  [12-35] 18 (03/19 0400) BP: (81-120)/(54-83) 99/70 mmHg (03/19 0400) SpO2:  [87 %-100 %] 97 % (03/19 0814) Office follow up Special Information or instructions. She will be followed by home hospice. She can follow up with Dr. Vassie Loll as an outpatient for pulmonary issues. Dr. Barbette Merino is her oral surgeon. Signed: Brett Canales Minor ACNP [facility] Pager [phone] till 3 pm If no answer page [phone] 07/19/2015, 8:53 AM     Levy Pupa, MD, PhD 07/19/2015, 8:43 PM [place] [phone] or if no answer [phone]

## 2015-07-20 ENCOUNTER — Encounter (HOSPITAL_COMMUNITY): Payer: Self-pay | Admitting: Oral Surgery

## 2015-07-20 LAB — GLUCOSE, CAPILLARY: Glucose-Capillary: 40 mg/dL — CL (ref 65–99)

## 2015-08-19 ENCOUNTER — Telehealth: Payer: Self-pay | Admitting: Pulmonary Disease

## 2015-08-19 NOTE — Telephone Encounter (Signed)
Spoke with Toniann FailWendy  Since pt has medicaid she needed VO for the pt to continue hospice care  I gave her the VO for this  Nothing further needed

## 2015-09-17 ENCOUNTER — Telehealth: Payer: Self-pay | Admitting: Pulmonary Disease

## 2015-09-17 NOTE — Telephone Encounter (Signed)
Spoke with Marcelino DusterMichelle, pt's Hospice RN  She states that the pt has been taking prednisone 50 mg "for a long time"- she believes at least for the past year  She is having some swelling in her face, belly and her feet "typical prednisone swelling"- per Marcelino DusterMichelle  She wonders if RA would like to lower this dose  Please advise, thanks!

## 2015-09-18 MED ORDER — PREDNISONE 10 MG PO TABS: ORAL_TABLET | ORAL | Status: DC

## 2015-09-18 NOTE — Telephone Encounter (Signed)
Spoke with pt's Hospice nurse, Marcelino DusterMichelle. She is aware of RA's recommendation. Rx has been sent in per Michelle's request. Nothing further was needed.

## 2015-09-18 NOTE — Telephone Encounter (Signed)
Decreased to 40 mg 1 week, then 30 mg 1 week, then 20 mg daily

## 2015-10-14 ENCOUNTER — Telehealth: Payer: Self-pay | Admitting: Pulmonary Disease

## 2015-10-14 NOTE — Telephone Encounter (Signed)
OK with me Can PCP take over her hospice care ?

## 2015-10-14 NOTE — Telephone Encounter (Signed)
Spoke with Jan at hospice, requesting a verbal OK from RA to continue patient on hospice care, and to see if RA is ok with continuing to be pt's attending.    RA please advise.  Thanks!

## 2015-10-14 NOTE — Telephone Encounter (Signed)
[facility] aware of recs.  Nothing further needed.

## 2015-10-24 ENCOUNTER — Other Ambulatory Visit: Payer: Self-pay | Admitting: Pulmonary Disease

## 2015-11-19 ENCOUNTER — Telehealth: Payer: Self-pay | Admitting: Pulmonary Disease

## 2015-11-19 NOTE — Telephone Encounter (Signed)
Called and spoke with julie from Hospice and she is aware of order for duoneb every 6 hours prn.

## 2015-11-19 NOTE — Telephone Encounter (Signed)
Raynelle FanningJulie returning call - She can be reached at [phone]

## 2015-11-19 NOTE — Telephone Encounter (Signed)
Called spoke with Raynelle FanningJulie. She states that the patient is currently using albuterol and ipratropium separately. She explained that the patient has a hard time holding the nebulizer for a long period of time and is requesting that the medication be switched to Duoneb, She feels that the duoneb will be more beneficial because she won't have to mix the medication or hold the nebulizer for two different treatments. I explained to her that I would send a message to RA for his approval. She voiced understanding and had no further questions.   RA please advise

## 2015-11-19 NOTE — Telephone Encounter (Signed)
Left message for Hospice nurse, Willette PaJulie Brown to call back.

## 2015-11-19 NOTE — Telephone Encounter (Signed)
OK for duonebs q6h prn

## 2015-12-05 ENCOUNTER — Other Ambulatory Visit: Payer: Self-pay | Admitting: Pulmonary Disease

## 2015-12-16 ENCOUNTER — Telehealth: Payer: Self-pay | Admitting: Pulmonary Disease

## 2015-12-16 NOTE — Telephone Encounter (Signed)
Per michelle, okay to give VO for this.  Called jan and gave VO. Nothing further needed

## 2015-12-16 NOTE — Telephone Encounter (Signed)
#   for Jan is incorrect. Found # in previous messages for Jan [phone] [facility]

## 2015-12-16 NOTE — Telephone Encounter (Signed)
Jan returned call, CB is [phone].

## 2016-01-15 ENCOUNTER — Other Ambulatory Visit: Payer: Self-pay | Admitting: Pulmonary Disease

## 2016-02-14 ENCOUNTER — Other Ambulatory Visit: Payer: Self-pay | Admitting: Pulmonary Disease

## 2016-02-16 ENCOUNTER — Encounter: Payer: Self-pay | Admitting: Pulmonary Disease

## 2016-02-16 ENCOUNTER — Telehealth: Payer: Self-pay | Admitting: Pulmonary Disease

## 2016-02-16 NOTE — Telephone Encounter (Signed)
Jan called back from Hospice and was given the VO for new certification for hospice. Nothing further is needed.

## 2016-02-16 NOTE — Telephone Encounter (Signed)
Left message for Patricia [facility]goJan Santana [facility] to [facility].

## 2016-02-17 ENCOUNTER — Telehealth: Payer: Self-pay | Admitting: Pulmonary Disease

## 2016-02-17 NOTE — Telephone Encounter (Signed)
Called and lmom for angela with Hospice.  Gave her the VO that she needed.  Advised her in the VM to call back if anything else is needed.

## 2016-02-19 ENCOUNTER — Telehealth: Payer: Self-pay | Admitting: Pulmonary Disease

## 2016-02-23 ENCOUNTER — Telehealth: Payer: Self-pay

## 2016-02-23 NOTE — Telephone Encounter (Signed)
On 02/23/2016 [facility] The death certificate is for cremation. The patient is a patient of Doctor Byrum. The death certificate will be taken to Pulmonary Unit this pm for signature and see if Doctor Craige CottaSood will sign it since Doctor Delton CoombesByrum is out of office.  On 02/26/2016 I received the death certificate back from Doctor Vassie LollAlva who signed the death certificate for Doctor Byrum. I got the death certificate ready and called the funeral home to let them know the death certificate is ready for pick up.

## 2016-03-02 NOTE — Telephone Encounter (Signed)
Will forward to RA to make him aware

## 2016-03-02 DEATH — deceased
